# Patient Record
Sex: Female | Born: 1935 | Race: White | Hispanic: No | Smoking: Never smoker
Health system: Southern US, Community
[De-identification: ages and names within clinical notes are randomized; demographics above are authoritative.]

## PROBLEM LIST (undated history)

## (undated) DIAGNOSIS — J449 Chronic obstructive pulmonary disease, unspecified: Secondary | ICD-10-CM

## (undated) DIAGNOSIS — I10 Essential (primary) hypertension: Secondary | ICD-10-CM

## (undated) DIAGNOSIS — F0390 Unspecified dementia without behavioral disturbance: Secondary | ICD-10-CM

## (undated) DIAGNOSIS — T7840XA Allergy, unspecified, initial encounter: Secondary | ICD-10-CM

## (undated) DIAGNOSIS — K219 Gastro-esophageal reflux disease without esophagitis: Secondary | ICD-10-CM

## (undated) DIAGNOSIS — E785 Hyperlipidemia, unspecified: Secondary | ICD-10-CM

## (undated) DIAGNOSIS — G20A1 Parkinson's disease without dyskinesia, without mention of fluctuations: Secondary | ICD-10-CM

## (undated) DIAGNOSIS — G20 Parkinson's disease: Secondary | ICD-10-CM

## (undated) HISTORY — DX: Essential (primary) hypertension: I10

## (undated) HISTORY — PX: ABDOMINAL HYSTERECTOMY: SHX81

## (undated) HISTORY — DX: Allergy, unspecified, initial encounter: T78.40XA

## (undated) HISTORY — PX: FOOT GANGLION EXCISION: SHX1660

## (undated) HISTORY — DX: Gastro-esophageal reflux disease without esophagitis: K21.9

## (undated) HISTORY — PX: APPENDECTOMY: SHX54

## (undated) HISTORY — DX: Hyperlipidemia, unspecified: E78.5

## (undated) HISTORY — PX: SHOULDER SURGERY: SHX246

## (undated) HISTORY — PX: EYE SURGERY: SHX253

## (undated) HISTORY — DX: Chronic obstructive pulmonary disease, unspecified: J44.9

---

## 1986-11-06 HISTORY — PX: LUNG REMOVAL, PARTIAL: SHX233

## 1991-11-07 HISTORY — PX: CYSTOSCOPY: SUR368

## 2004-11-24 ENCOUNTER — Ambulatory Visit: Payer: Self-pay

## 2004-12-07 HISTORY — PX: CT LUNG SCREENING: HXRAD848

## 2005-02-08 ENCOUNTER — Ambulatory Visit: Payer: Self-pay | Admitting: Family Medicine

## 2005-02-20 ENCOUNTER — Ambulatory Visit: Payer: Self-pay | Admitting: Family Medicine

## 2005-05-31 ENCOUNTER — Ambulatory Visit: Payer: Self-pay

## 2005-08-07 ENCOUNTER — Ambulatory Visit: Payer: Self-pay | Admitting: Family Medicine

## 2005-09-04 ENCOUNTER — Ambulatory Visit: Payer: Self-pay | Admitting: Family Medicine

## 2006-03-07 ENCOUNTER — Ambulatory Visit: Payer: Self-pay | Admitting: Family Medicine

## 2006-04-06 ENCOUNTER — Encounter: Payer: Self-pay | Admitting: Family Medicine

## 2006-04-06 LAB — CONVERTED CEMR LAB

## 2006-04-26 ENCOUNTER — Ambulatory Visit: Payer: Self-pay | Admitting: Family Medicine

## 2006-05-04 ENCOUNTER — Ambulatory Visit: Payer: Self-pay | Admitting: Pulmonary Disease

## 2006-05-23 ENCOUNTER — Ambulatory Visit: Payer: Self-pay | Admitting: Family Medicine

## 2006-08-15 ENCOUNTER — Ambulatory Visit: Payer: Self-pay | Admitting: Family Medicine

## 2006-09-21 ENCOUNTER — Ambulatory Visit: Payer: Self-pay | Admitting: Family Medicine

## 2006-10-08 ENCOUNTER — Ambulatory Visit: Payer: Self-pay | Admitting: Family Medicine

## 2006-11-06 HISTORY — PX: OTHER SURGICAL HISTORY: SHX169

## 2006-11-06 HISTORY — PX: FRACTURE SURGERY: SHX138

## 2006-11-21 ENCOUNTER — Ambulatory Visit: Payer: Self-pay | Admitting: Pulmonary Disease

## 2007-04-09 ENCOUNTER — Ambulatory Visit: Payer: Self-pay | Admitting: Family Medicine

## 2007-04-11 LAB — CONVERTED CEMR LAB
ALT: 15 units/L (ref 0–40)
HDL: 60.1 mg/dL (ref >39.0)
Total CHOL/HDL Ratio: 3.9
Triglycerides: 134 mg/dL (ref 0–149)
VLDL: 27 mg/dL (ref 0–40)

## 2007-04-30 ENCOUNTER — Encounter: Payer: Self-pay | Admitting: Family Medicine

## 2007-04-30 DIAGNOSIS — K219 Gastro-esophageal reflux disease without esophagitis: Secondary | ICD-10-CM | POA: Insufficient documentation

## 2007-04-30 DIAGNOSIS — M810 Age-related osteoporosis without current pathological fracture: Secondary | ICD-10-CM | POA: Insufficient documentation

## 2007-04-30 DIAGNOSIS — J449 Chronic obstructive pulmonary disease, unspecified: Secondary | ICD-10-CM

## 2007-04-30 DIAGNOSIS — I10 Essential (primary) hypertension: Secondary | ICD-10-CM

## 2007-04-30 DIAGNOSIS — E785 Hyperlipidemia, unspecified: Secondary | ICD-10-CM

## 2007-04-30 DIAGNOSIS — J4489 Other specified chronic obstructive pulmonary disease: Secondary | ICD-10-CM | POA: Insufficient documentation

## 2007-07-23 ENCOUNTER — Ambulatory Visit: Payer: Self-pay | Admitting: Family Medicine

## 2007-07-26 ENCOUNTER — Telehealth (INDEPENDENT_AMBULATORY_CARE_PROVIDER_SITE_OTHER): Payer: Self-pay | Admitting: *Deleted

## 2007-09-25 ENCOUNTER — Ambulatory Visit: Payer: Self-pay | Admitting: Family Medicine

## 2007-09-25 DIAGNOSIS — J3089 Other allergic rhinitis: Secondary | ICD-10-CM

## 2007-09-25 DIAGNOSIS — J302 Other seasonal allergic rhinitis: Secondary | ICD-10-CM

## 2007-09-30 LAB — CONVERTED CEMR LAB
ALT: 18 units/L (ref 0–35)
AST: 29 units/L (ref 0–37)
LDL Cholesterol: 72 mg/dL (ref 0–99)
Total CHOL/HDL Ratio: 2.2
VLDL: 12 mg/dL (ref 0–40)

## 2007-10-23 ENCOUNTER — Ambulatory Visit: Payer: Self-pay | Admitting: Internal Medicine

## 2007-12-30 ENCOUNTER — Ambulatory Visit: Payer: Self-pay | Admitting: Family Medicine

## 2008-01-07 ENCOUNTER — Ambulatory Visit: Payer: Self-pay | Admitting: Internal Medicine

## 2008-01-13 ENCOUNTER — Telehealth: Payer: Self-pay | Admitting: Family Medicine

## 2008-02-05 ENCOUNTER — Ambulatory Visit: Payer: Self-pay | Admitting: Family Medicine

## 2008-03-06 LAB — CONVERTED CEMR LAB: Pap Smear: NORMAL

## 2008-03-30 ENCOUNTER — Ambulatory Visit: Payer: Self-pay | Admitting: Specialist

## 2008-05-28 ENCOUNTER — Telehealth (INDEPENDENT_AMBULATORY_CARE_PROVIDER_SITE_OTHER): Payer: Self-pay | Admitting: *Deleted

## 2008-05-29 ENCOUNTER — Ambulatory Visit: Payer: Self-pay | Admitting: Family Medicine

## 2008-06-02 LAB — CONVERTED CEMR LAB
AST: 27 units/L (ref 0–37)
CO2: 29 meq/L (ref 19–32)
Chloride: 96 meq/L (ref 96–112)
GFR calc Af Amer: 103 mL/min
GFR calc non Af Amer: 85 mL/min
Glucose, Bld: 95 mg/dL (ref 70–99)
Potassium: 3.9 meq/L (ref 3.5–5.1)
Sodium: 135 meq/L (ref 135–145)

## 2008-06-02 IMAGING — CT CT CHEST W/ CM
1 series · 15 of 33 positions shown, 19 images · non-contrast
Comparison: none

REASON FOR EXAM: increased opacities
COMMENTS:

[Series 2: soft tissue · axial · 0.64mm/px · z∈[-572,-282]mm · 15 of 70 slices shown, 19 images]
[im 6/70  mediastinal]
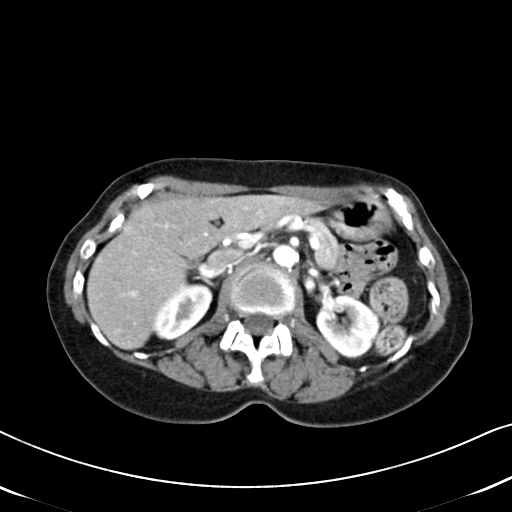
[im 6/70  lung]
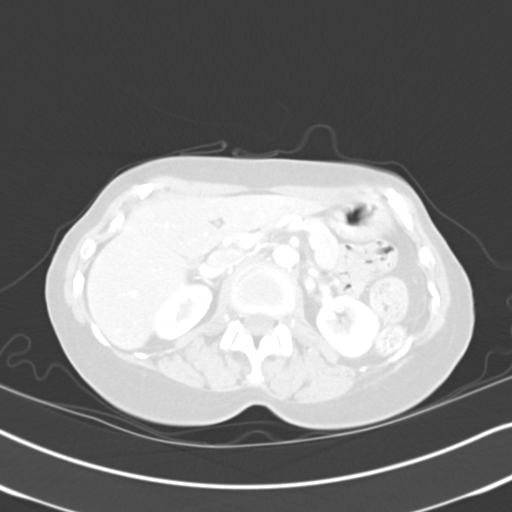
[im 11/70  lung]
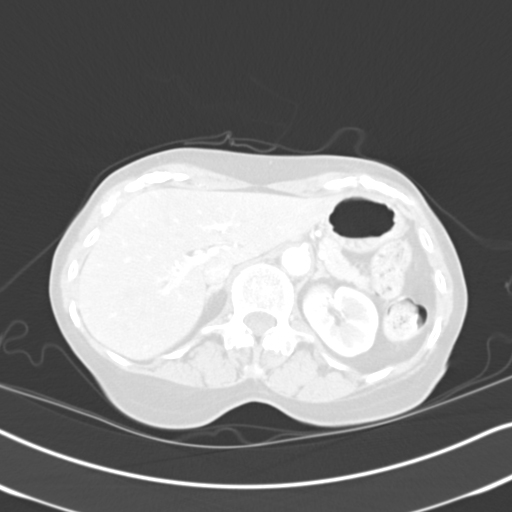
[im 14/70  lung]
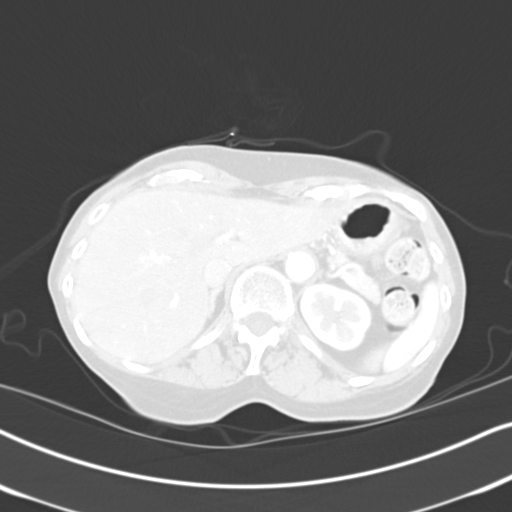
[im 18/70  lung]
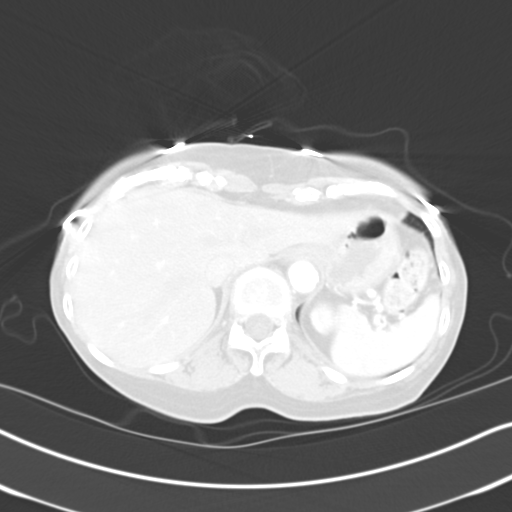
[im 24/70  mediastinal]
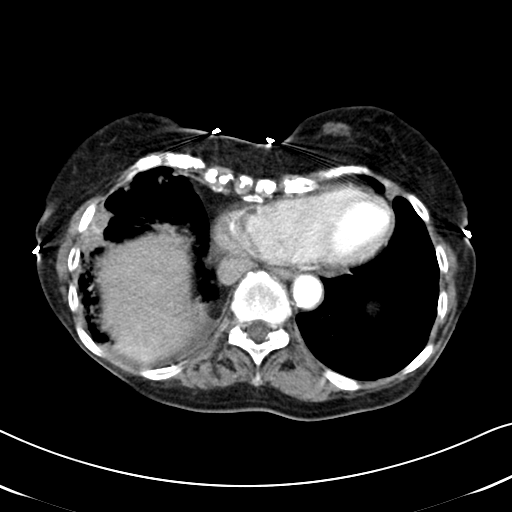
[im 24/70  lung]
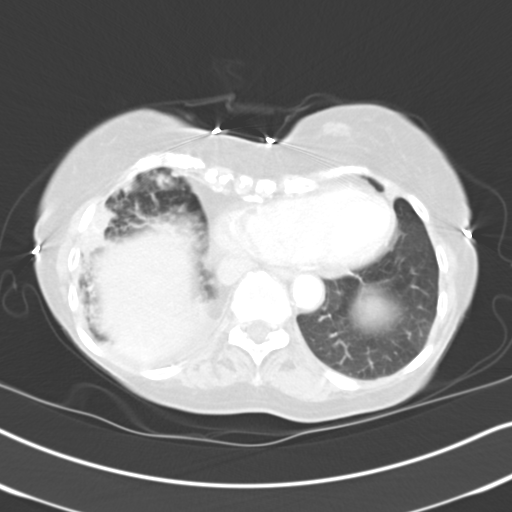
[im 28/70  lung]
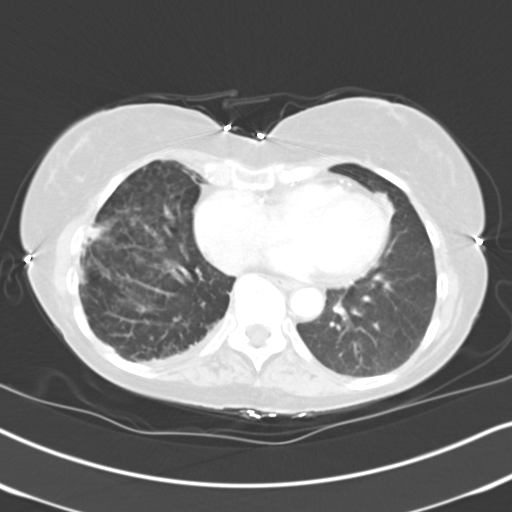
[im 31/70  lung]
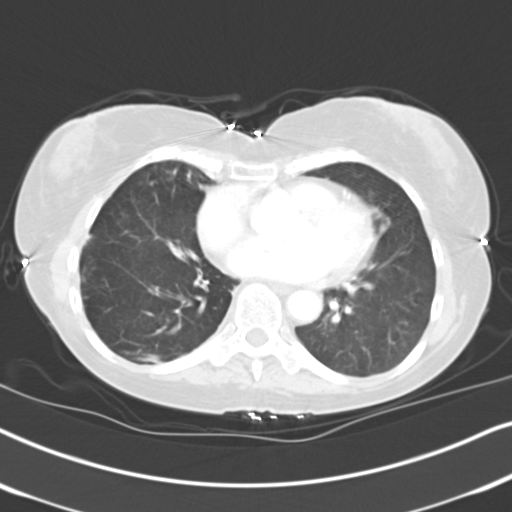
[im 36/70  lung]
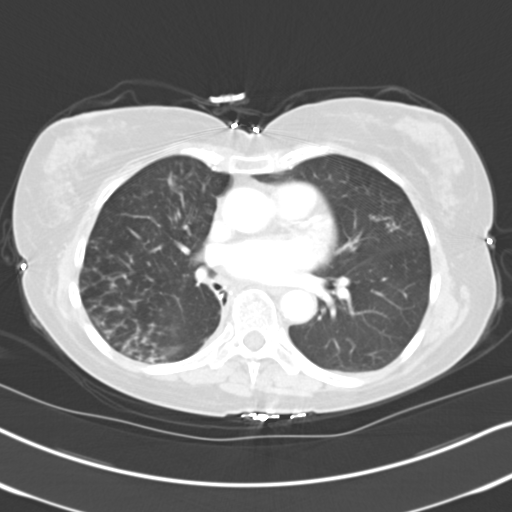
[im 39/70  mediastinal]
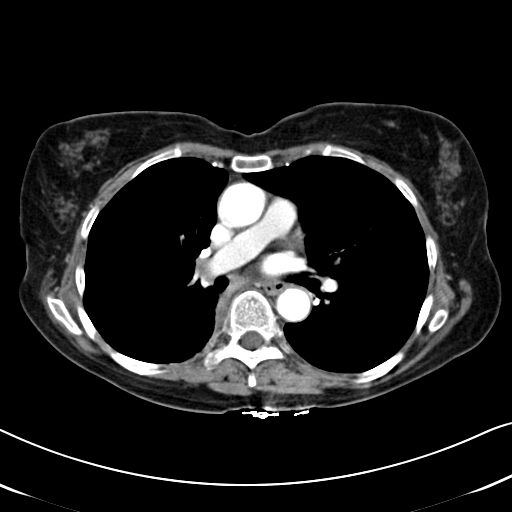
[im 39/70  lung]
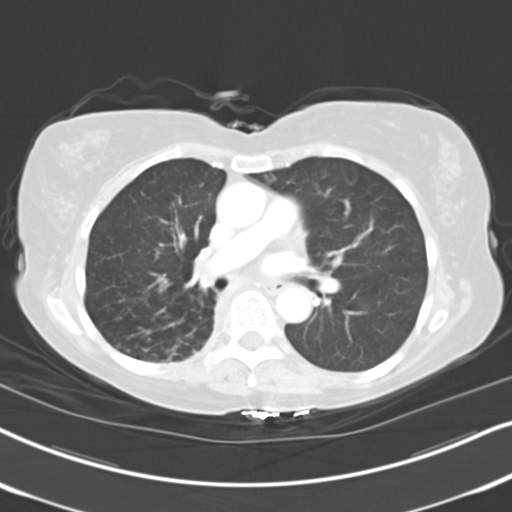
[im 42/70  lung]
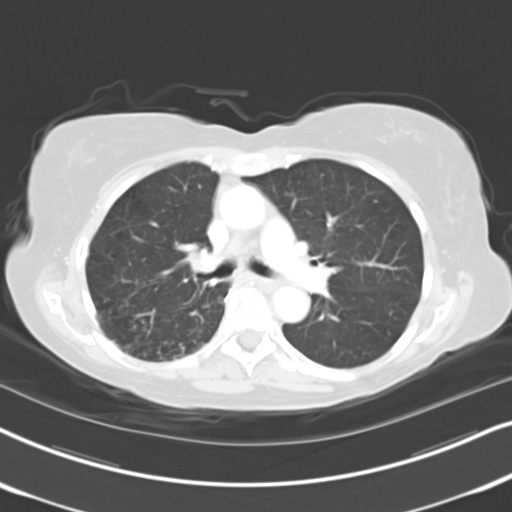
[im 47/70  lung]
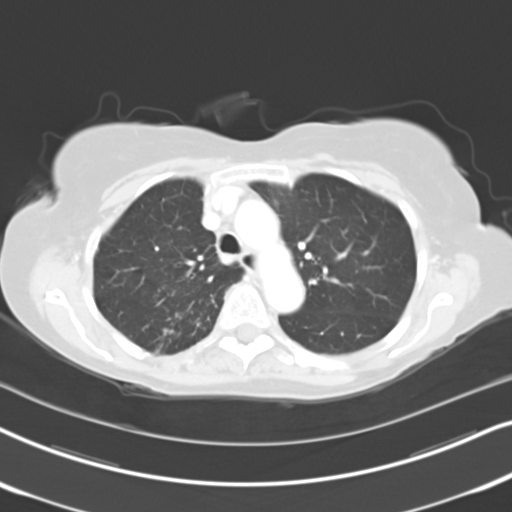
[im 52/70  lung]
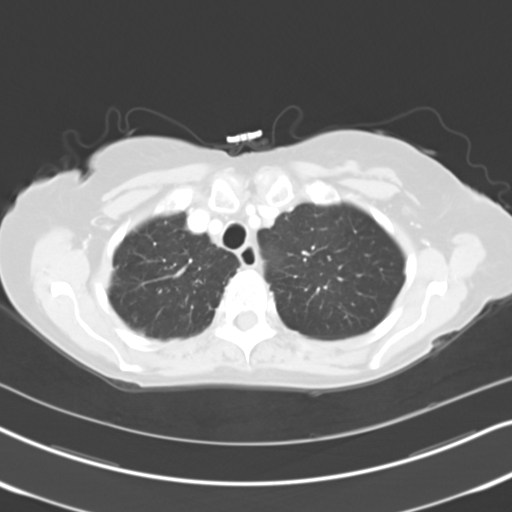
[im 56/70  mediastinal]
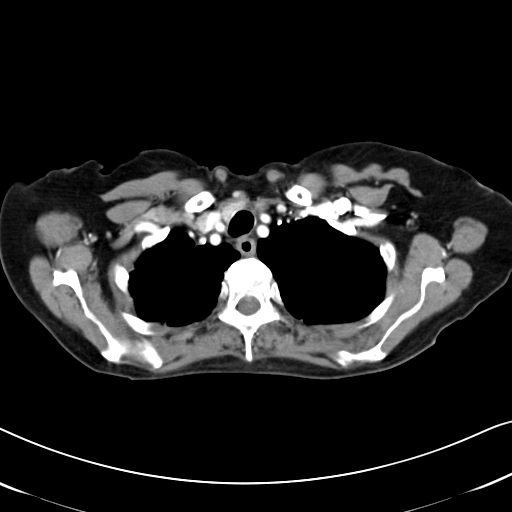
[im 56/70  lung]
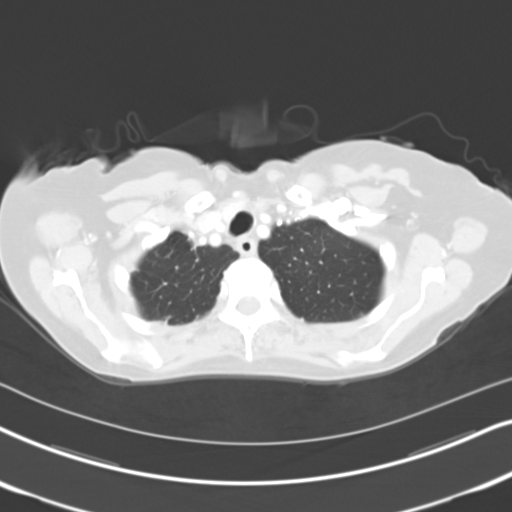
[im 59/70  lung]
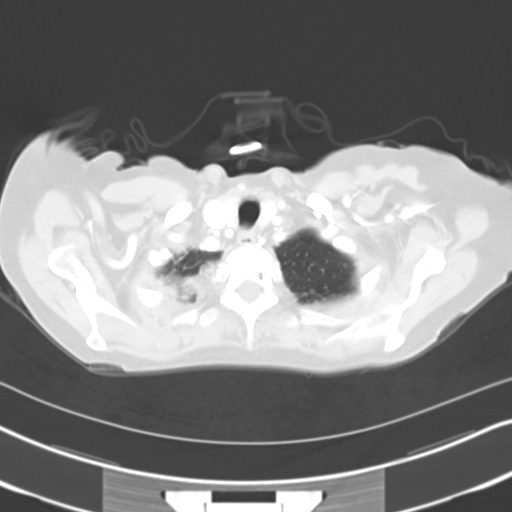
[im 64/70  lung]
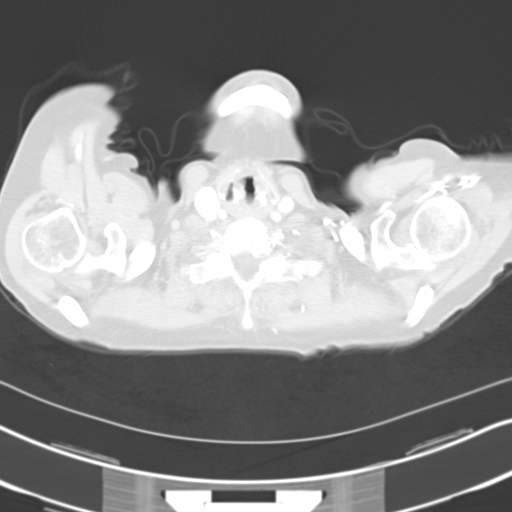

[15 of 33 positions shown; findings below may reference images not displayed]

PROCEDURE:     CT  - CT CHEST WITH CONTRAST  - March 30, 2008  [DATE]

RESULT:     CT of the chest is performed utilizing 70 ml of 2sovue-WRD
iodinated intravenous contrast. Comparison is made to images dated 11/24/2004
and 05/31/2005. The pleural based density in the RIGHT lung base in the
region of the costophrenic angle appears to have increased since the
previous exam. There is a trace RIGHT pleural effusion. The pleural based
mass measures approximately 3 cm anterior to posterior x 1.5 cm medial to
lateral. The adrenal glands are normal. Lung window images demonstrate
diffusely increased prominence of the lung markings in the RIGHT lower lobe
and RIGHT middle lobe and to a lesser extent the basal portion of the RIGHT
upper lobe. No focal consolidation is present. There is respiratory motion
artifact. The LEFT lung remains relatively clear. Some fibrotic changes are
present at the apices. The upper abdominal viscera appear unremarkable.
There is no mediastinal or hilar mass or adenopathy evident.
IMPRESSION: 1.Enlarging lesion, pleural based, in the RIGHT lower lobe. There are patchy
areas of infiltrate or fibrosis in the RIGHT lung involving the RIGHT lower
lobe and RIGHT middle lobe predominantly with some minimal involvement in
the RIGHT upper lobe. Follow-up with PET CT may be beneficial. Progressive
fibrosis cannot be excluded.

## 2008-07-21 ENCOUNTER — Ambulatory Visit: Payer: Self-pay | Admitting: Family Medicine

## 2008-07-31 ENCOUNTER — Ambulatory Visit: Payer: Self-pay | Admitting: Family Medicine

## 2008-07-31 LAB — CONVERTED CEMR LAB
Bilirubin Urine: NEGATIVE
Casts: 0 /lpf
Glucose, Urine, Semiquant: NEGATIVE
Urobilinogen, UA: 0.2
Yeast, UA: 0

## 2008-08-01 ENCOUNTER — Encounter: Payer: Self-pay | Admitting: Family Medicine

## 2008-08-06 ENCOUNTER — Telehealth (INDEPENDENT_AMBULATORY_CARE_PROVIDER_SITE_OTHER): Payer: Self-pay | Admitting: *Deleted

## 2008-08-17 ENCOUNTER — Ambulatory Visit: Payer: Self-pay | Admitting: Family Medicine

## 2008-09-08 ENCOUNTER — Ambulatory Visit: Payer: Self-pay | Admitting: Family Medicine

## 2008-09-09 LAB — CONVERTED CEMR LAB
ALT: 18 units/L (ref 0–35)
Albumin: 3.8 g/dL (ref 3.5–5.2)
BUN: 13 mg/dL (ref 6–23)
Basophils Relative: 0.8 % (ref 0.0–3.0)
Bilirubin, Direct: 0.1 mg/dL (ref 0.0–0.3)
Chloride: 97 meq/L (ref 96–112)
Eosinophils Absolute: 0.2 10*3/uL (ref 0.0–0.7)
Eosinophils Relative: 4.8 % (ref 0.0–5.0)
GFR calc Af Amer: 103 mL/min
GFR calc non Af Amer: 85 mL/min
HCT: 38.4 % (ref 36.0–46.0)
HDL: 75.5 mg/dL (ref >39.0)
Hemoglobin: 13.3 g/dL (ref 12.0–15.0)
MCV: 93.6 fL (ref 78.0–100.0)
Monocytes Absolute: 0.5 10*3/uL (ref 0.1–1.0)
Monocytes Relative: 11 % (ref 3.0–12.0)
Neutro Abs: 2.7 10*3/uL (ref 1.4–7.7)
Phosphorus: 3.8 mg/dL (ref 2.3–4.6)
Platelets: 322 10*3/uL (ref 150–400)
Potassium: 3.9 meq/L (ref 3.5–5.1)
RBC: 4.11 M/uL (ref 3.87–5.11)
Sodium: 134 meq/L — ABNORMAL LOW (ref 135–145)
Total CHOL/HDL Ratio: 2.3
Total Protein: 7.1 g/dL (ref 6.0–8.3)
WBC: 4.6 10*3/uL (ref 4.5–10.5)

## 2008-09-10 LAB — CONVERTED CEMR LAB: Vit D, 1,25-Dihydroxy: 43 (ref 30–89)

## 2008-09-14 ENCOUNTER — Ambulatory Visit: Payer: Self-pay | Admitting: Specialist

## 2008-11-11 ENCOUNTER — Telehealth: Payer: Self-pay | Admitting: Family Medicine

## 2008-11-12 ENCOUNTER — Ambulatory Visit: Payer: Self-pay | Admitting: Family Medicine

## 2008-11-12 DIAGNOSIS — E162 Hypoglycemia, unspecified: Secondary | ICD-10-CM | POA: Insufficient documentation

## 2008-11-17 IMAGING — CT CT CHEST W/ CM
1 series · 15 of 33 positions shown, 19 images · IV contrast (isovue)
Comparison: none

REASON FOR EXAM: Right mid base lesion
COMMENTS:

PROCEDURE:     CT  - CT CHEST WITH CONTRAST  - September 14, 2008 [DATE]
RESULT:     CT of the chest is performed utilizing approximately 75 ml of
Isovue-18P iodinated intravenous contrast.
Comparison is made to previous examinations performed on 03/30/2008 as well
as 05/31/2005.

[Series 2: soft tissue · axial · 0.59mm/px · z∈[+36,+311]mm · 15 of 65 slices shown, 19 images]
[im 5/65  mediastinal]
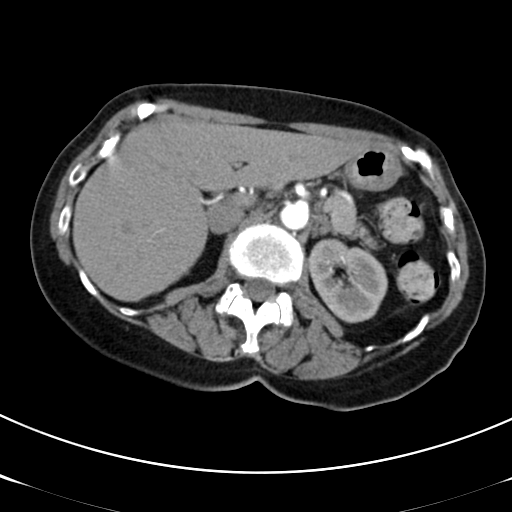
[im 5/65  lung]
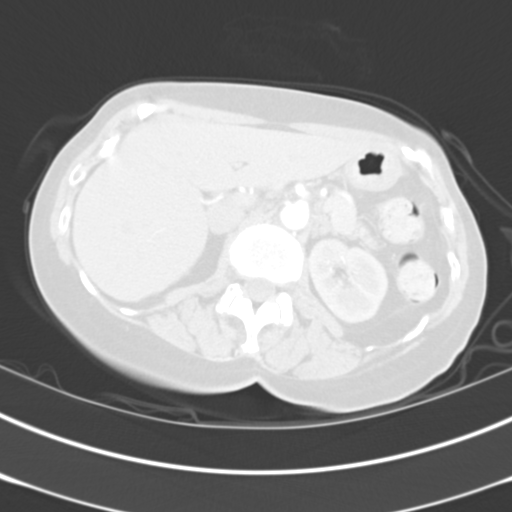
[im 10/65  lung]
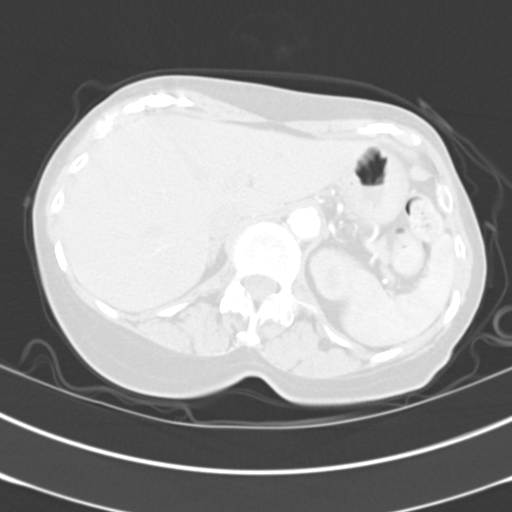
[im 13/65  lung]
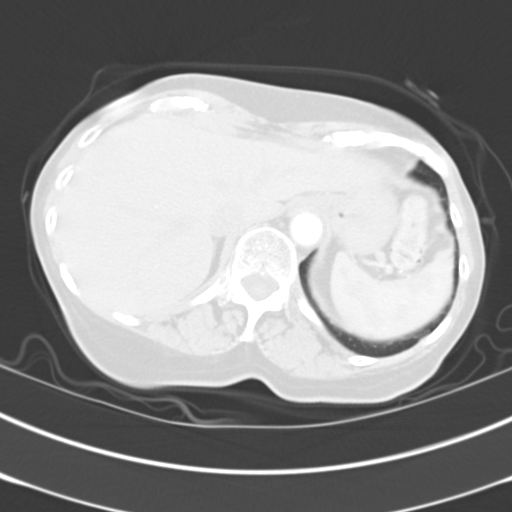
[im 17/65  lung]
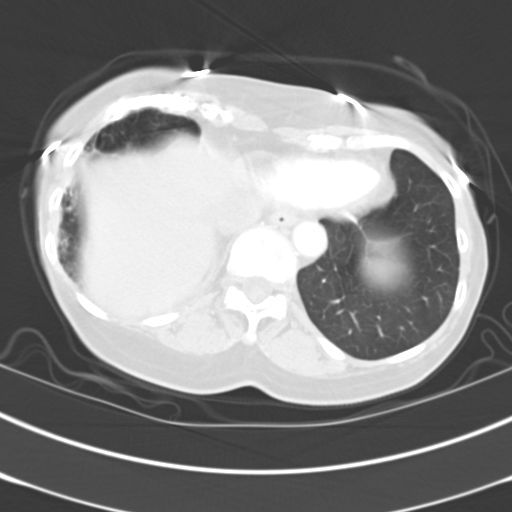
[im 22/65  mediastinal]
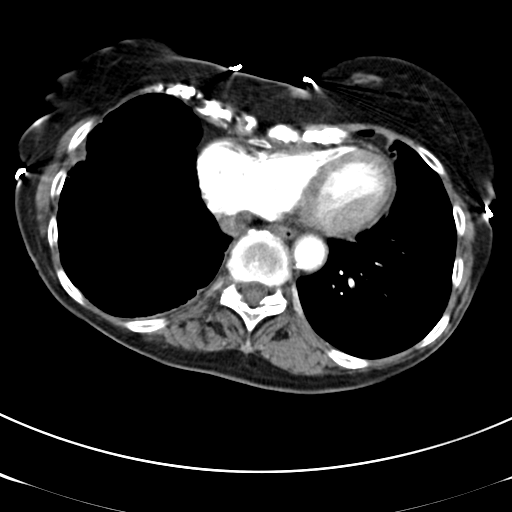
[im 22/65  lung]
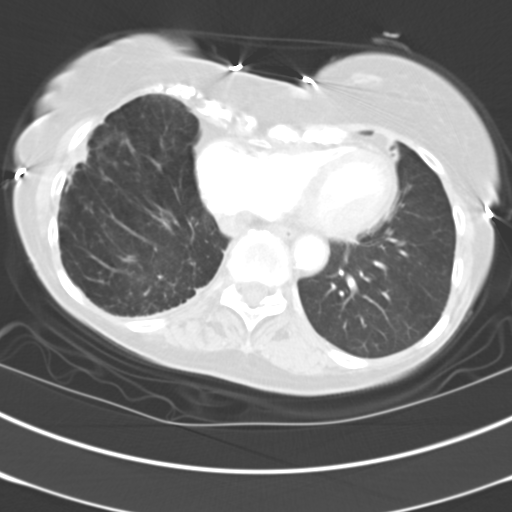
[im 26/65  lung]
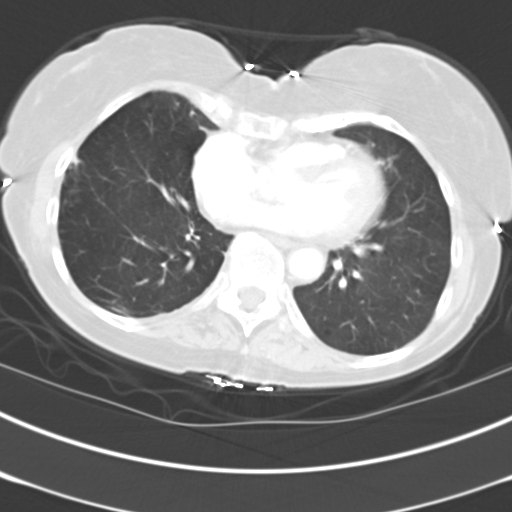
[im 29/65  lung]
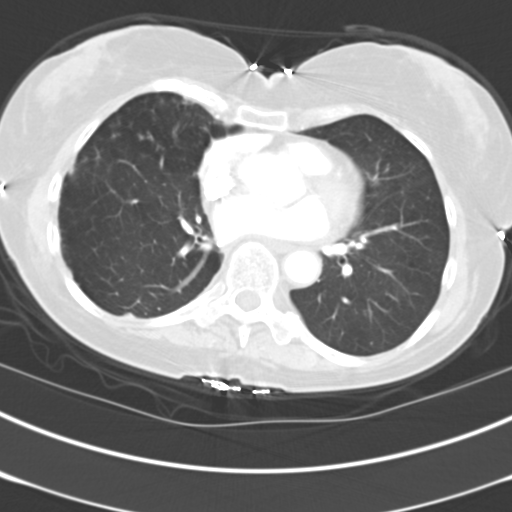
[im 34/65  lung]
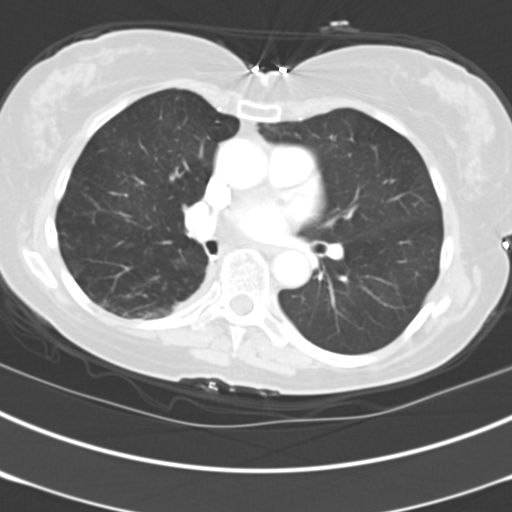
[im 36/65  mediastinal]
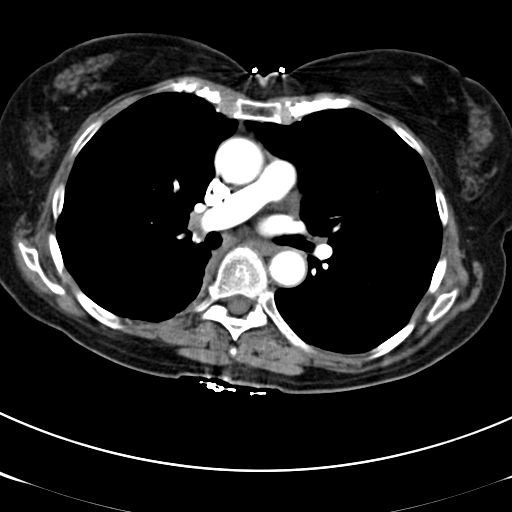
[im 36/65  lung]
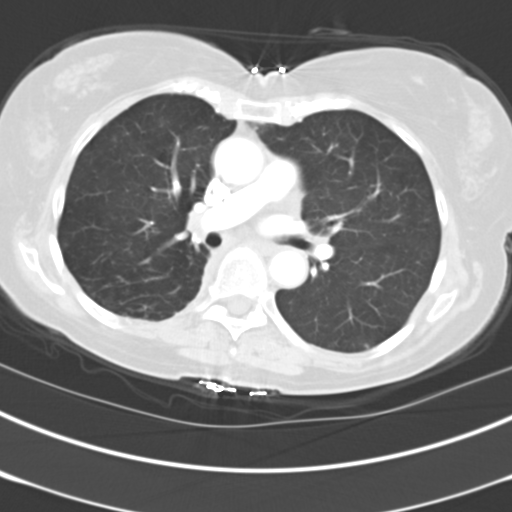
[im 39/65  lung]
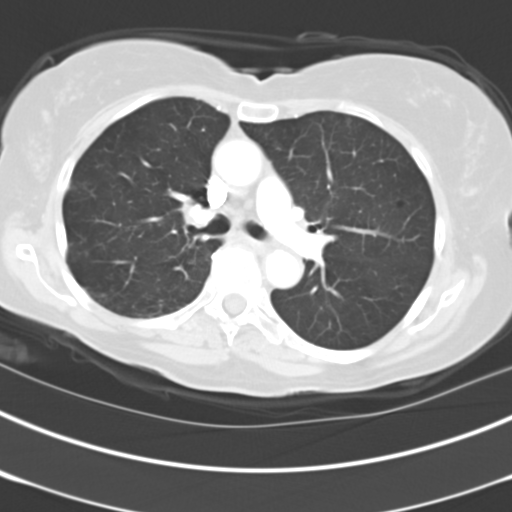
[im 43/65  lung]
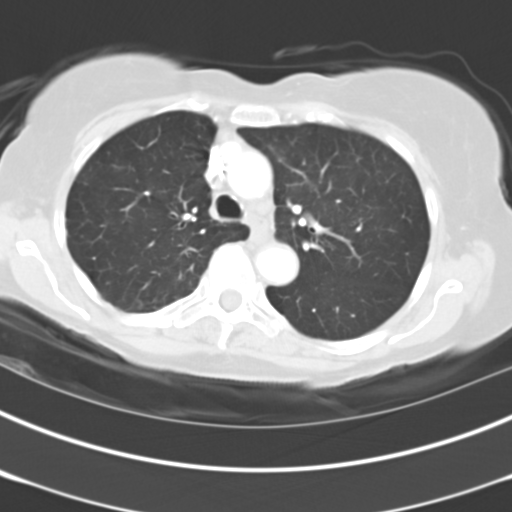
[im 48/65  lung]
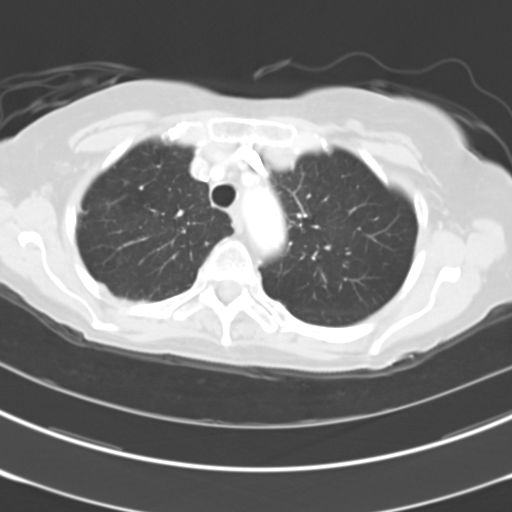
[im 52/65  mediastinal]
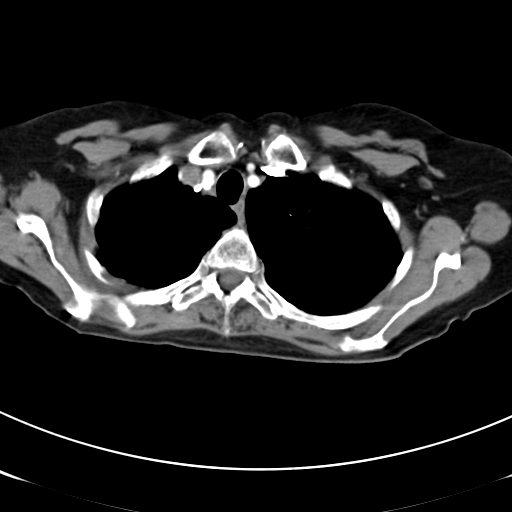
[im 52/65  lung]
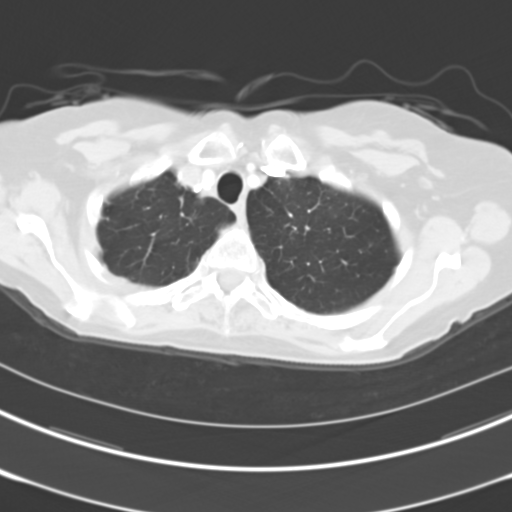
[im 55/65  lung]
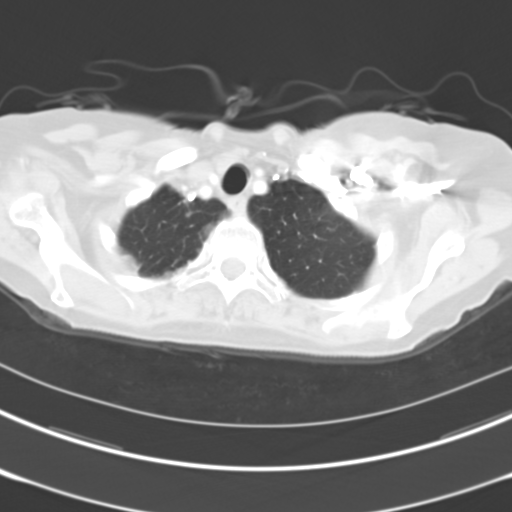
[im 60/65  lung]
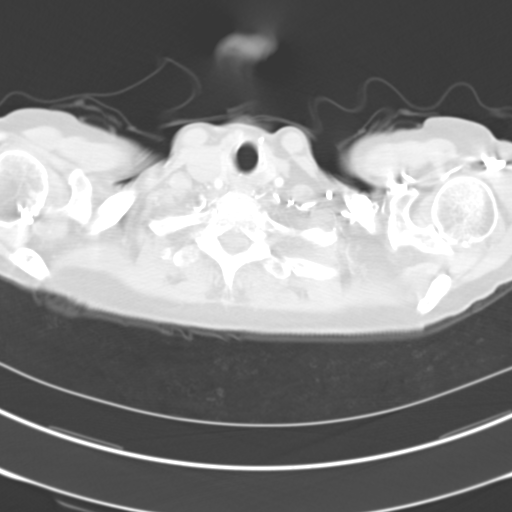

[15 of 33 positions shown; findings below may reference images not displayed]

FINDINGS: There is pleural-based density near the RIGHT lung base laterally
showing an anterior to posterior dimension of 1.78 cm and a medial to
lateral dimension of 1.12 cm. This appears to be essentially stable. Lung
window images show what appears to be some fibrosis especially at the RIGHT
lung base. Some ill-defined, patchy infiltrates appear to be present as
well. Underlying moderately severe emphysematous changes are noted. There is
no evidence of mediastinal or hilar mass or adenopathy. No axillary mass is
present. There is no pleural or pericardial effusion. There is a mild pectus
excavatum deformity. The upper abdominal images demonstrate early arterial
phase enhancement.
IMPRESSION: Improved aeration at the RIGHT lung base. There may be some
patchy areas of fibrosis or minimal infiltrate present. There is subpleural
nodularity which is pleural-based near the RIGHT lung base anterolaterally.
This is stable. There is minimal basilar infiltrate or fibrosis on the
RIGHT. Diffuse emphysematous changes are present.

## 2008-11-25 ENCOUNTER — Telehealth (INDEPENDENT_AMBULATORY_CARE_PROVIDER_SITE_OTHER): Payer: Self-pay | Admitting: *Deleted

## 2009-01-18 ENCOUNTER — Telehealth (INDEPENDENT_AMBULATORY_CARE_PROVIDER_SITE_OTHER): Payer: Self-pay | Admitting: *Deleted

## 2009-01-19 ENCOUNTER — Ambulatory Visit: Payer: Self-pay | Admitting: Family Medicine

## 2009-02-02 ENCOUNTER — Ambulatory Visit: Payer: Self-pay | Admitting: Family Medicine

## 2009-02-02 LAB — CONVERTED CEMR LAB
Bilirubin Urine: NEGATIVE
Glucose, Urine, Semiquant: NEGATIVE
Ketones, urine, test strip: NEGATIVE
Specific Gravity, Urine: <1.005
Urobilinogen, UA: 0.2
pH: 8.5

## 2009-02-03 ENCOUNTER — Encounter: Payer: Self-pay | Admitting: Family Medicine

## 2009-02-16 ENCOUNTER — Encounter: Payer: Self-pay | Admitting: Family Medicine

## 2009-05-06 LAB — CONVERTED CEMR LAB: Pap Smear: NORMAL

## 2009-07-27 ENCOUNTER — Telehealth: Payer: Self-pay | Admitting: Family Medicine

## 2009-08-12 ENCOUNTER — Ambulatory Visit: Payer: Self-pay | Admitting: Family Medicine

## 2009-08-31 ENCOUNTER — Telehealth: Payer: Self-pay | Admitting: Family Medicine

## 2009-09-07 ENCOUNTER — Ambulatory Visit: Payer: Self-pay | Admitting: Family Medicine

## 2009-09-09 LAB — CONVERTED CEMR LAB
ALT: 19 units/L (ref 0–35)
BUN: 16 mg/dL (ref 6–23)
Basophils Relative: 0.3 % (ref 0.0–3.0)
CO2: 30 meq/L (ref 19–32)
Calcium: 9.2 mg/dL (ref 8.4–10.5)
Chloride: 99 meq/L (ref 96–112)
Cholesterol: 169 mg/dL (ref 0–200)
Eosinophils Relative: 3.2 % (ref 0.0–5.0)
GFR calc non Af Amer: 72.92 mL/min (ref >60)
HDL: 61.1 mg/dL (ref >39.00)
LDL Cholesterol: 93 mg/dL (ref 0–99)
Lymphocytes Relative: 29.8 % (ref 12.0–46.0)
Monocytes Absolute: 0.6 10*3/uL (ref 0.1–1.0)
Monocytes Relative: 11.5 % (ref 3.0–12.0)
Neutrophils Relative %: 55.2 % (ref 43.0–77.0)
Platelets: 316 10*3/uL (ref 150.0–400.0)
RBC: 3.82 M/uL — ABNORMAL LOW (ref 3.87–5.11)
TSH: 2.29 microintl units/mL (ref 0.35–5.50)
Triglycerides: 76 mg/dL (ref 0.0–149.0)
VLDL: 15.2 mg/dL (ref 0.0–40.0)
WBC: 5.2 10*3/uL (ref 4.5–10.5)

## 2009-09-14 ENCOUNTER — Ambulatory Visit: Payer: Self-pay | Admitting: Family Medicine

## 2010-02-14 ENCOUNTER — Encounter: Payer: Self-pay | Admitting: Family Medicine

## 2010-03-07 ENCOUNTER — Encounter: Payer: Self-pay | Admitting: Family Medicine

## 2010-03-08 ENCOUNTER — Ambulatory Visit: Payer: Self-pay | Admitting: Family Medicine

## 2010-03-09 LAB — CONVERTED CEMR LAB
Albumin: 3.7 g/dL (ref 3.5–5.2)
BUN: 15 mg/dL (ref 6–23)
Calcium: 9.1 mg/dL (ref 8.4–10.5)
Chloride: 100 meq/L (ref 96–112)
HDL: 58.8 mg/dL (ref >39.00)
Potassium: 4 meq/L (ref 3.5–5.1)
Total CHOL/HDL Ratio: 2
Triglycerides: 52 mg/dL (ref 0.0–149.0)
VLDL: 10.4 mg/dL (ref 0.0–40.0)

## 2010-03-15 ENCOUNTER — Ambulatory Visit: Payer: Self-pay | Admitting: Family Medicine

## 2010-06-15 ENCOUNTER — Ambulatory Visit: Payer: Self-pay | Admitting: Family Medicine

## 2010-06-24 ENCOUNTER — Ambulatory Visit: Payer: Self-pay | Admitting: Internal Medicine

## 2010-06-27 ENCOUNTER — Telehealth: Payer: Self-pay | Admitting: Family Medicine

## 2010-07-05 ENCOUNTER — Ambulatory Visit: Payer: Self-pay | Admitting: Family Medicine

## 2010-09-14 ENCOUNTER — Ambulatory Visit: Payer: Self-pay | Admitting: Family Medicine

## 2010-09-15 LAB — CONVERTED CEMR LAB
Albumin: 4 g/dL (ref 3.5–5.2)
BUN: 19 mg/dL (ref 6–23)
CO2: 31 meq/L (ref 19–32)
Calcium: 9.8 mg/dL (ref 8.4–10.5)
Cholesterol: 165 mg/dL (ref 0–200)
Creatinine, Ser: 0.7 mg/dL (ref 0.4–1.2)
LDL Cholesterol: 75 mg/dL (ref 0–99)
Triglycerides: 70 mg/dL (ref 0.0–149.0)
VLDL: 14 mg/dL (ref 0.0–40.0)

## 2010-09-19 ENCOUNTER — Ambulatory Visit: Payer: Self-pay | Admitting: Family Medicine

## 2010-10-16 ENCOUNTER — Emergency Department: Payer: Self-pay | Admitting: Unknown Physician Specialty

## 2010-10-21 ENCOUNTER — Ambulatory Visit: Payer: Self-pay | Admitting: Family Medicine

## 2010-10-21 DIAGNOSIS — S0180XA Unspecified open wound of other part of head, initial encounter: Secondary | ICD-10-CM | POA: Insufficient documentation

## 2010-10-24 ENCOUNTER — Emergency Department: Payer: Self-pay | Admitting: Unknown Physician Specialty

## 2010-11-10 ENCOUNTER — Ambulatory Visit
Admission: RE | Admit: 2010-11-10 | Discharge: 2010-11-10 | Payer: Self-pay | Source: Home / Self Care | Attending: Family Medicine | Admitting: Family Medicine

## 2010-11-16 ENCOUNTER — Encounter: Payer: Self-pay | Admitting: Family Medicine

## 2010-12-04 LAB — CONVERTED CEMR LAB
BUN: 15 mg/dL (ref 6–23)
Calcium: 9 mg/dL (ref 8.4–10.5)
GFR calc Af Amer: 152 mL/min
GFR calc non Af Amer: 126 mL/min
Potassium: 4.1 meq/L (ref 3.5–5.1)
Sodium: 136 meq/L (ref 135–145)

## 2010-12-06 NOTE — Progress Notes (Signed)
Summary: ? about cough med  Phone Note Call from Patient Call back at [phone]   Caller: Patient Call For: Dr. Sharen Hones Summary of Call: Patient was given a cough medication and it is causing her to cough more. Patient states that every time she takes the medication she coughs more. Patient has coughed so much that she is sore. Please advise.  [facility] Initial call taken by: Sydell Axon LPN,  June 27, 2010 11:46 AM  Follow-up for Phone Call        what medicine?  guaifenesin likely causing coughing.  If not feeling better with it, may stop.  Are tessalon perls helping?  Did she fill abx?  allergy to codeine.  Try honey with lemon.  If not better, may need to be seen again. Follow-up by: Eustaquio Boyden  MD,  June 27, 2010 12:17 PM  Additional Follow-up for Phone Call Additional follow up Details #1::        Spoke with patient. She just wanted confirmation that she was supposed to cough with the guaifenesin. The tesselon perls do help. I advised her that she may have the cough for 7-10 days or longer. I encouraged plenty of fluids to help break up the chest congestion. She understood and will call back if she has further problems Additional Follow-up by: Janee Morn CMA Duncan Dull),  June 27, 2010 2:02 PM    Additional Follow-up for Phone Call Additional follow up Details #2::    thank you.  if not bringing anything up with cough, can stop guaifenesin. Follow-up by: Eustaquio Boyden  MD,  June 27, 2010 2:03 PM  Additional Follow-up for Phone Call Additional follow up Details #3:: Details for Additional Follow-up Action Taken: She said was productive and she will continue meds as directed. She hasn't filled the abx as of now because she doesn't feel worse other than the pain from coughing. Additional Follow-up by: Janee Morn CMA Duncan Dull),  June 27, 2010 2:07 PM

## 2010-12-06 NOTE — Assessment & Plan Note (Signed)
Summary: 6 MONTH FOLLOW UP/[facility]   Vital Signs:  Patient profile:   77 year old female Height:      58 inches Weight:      93.08 pounds BMI:     19.52 Temp:     97.8 degrees F oral Pulse rate:   80 / minute Pulse rhythm:   regular BP sitting:   124 / 70  (left arm) Cuff size:   regular  Vitals Entered By: Lewanda Rife LPN (September 19, 2010 8:12 AM) CC: six month f/u   History of Present Illness: here for f/u of HTN and chol   wt is stable / slim   on vytorin for lipids -- with good report trig of 70 and HDL 75 and LDL 75 hdl went up - is in very good control   is still working -- and proud of this   bp well controlled on altace and clonidine -- with 124/70 today   has been feeling good   spot on nose- redness, will not go away she covers it with makeup no pain ? if could be an early skin cancer she does try to protect her skin  is still working and she enjoys it!    Allergies: 1)  ! Penicillin 2)  ! Sulfa 3)  Codeine  Past History:  Family History: Last updated: 04/30/2007 Father: MI Mother: CVA, HTN, breast cancer Siblings: brother- HTN, cholesterol  Social History: Last updated: 09/08/2008 Marital Status: widowed Children: 1 daughter Occupation: [employer] non smoker  no alcohol   Risk Factors: Smoking Status: never (01/19/2009)  Past Medical History: COPD- congenital lung deformity GERD / Hiatal hernia Hyperlipidemia Hypertension Osteoporosis all rhinitis scar tissue on lung (cxr) after surgery   cardiology- Dr Mikael Spray [facility]) GI-- Elliot/Seigel GYN-- Arvil Chaco pulm-- Flemming  ortho- Evaristo [facility]- Dr Reuel Boom  Past Surgical History: Appendectomy Cataract extraction Hysterectomy- partial, bleeding Lung-wedge resection-s/p sequestration 1988 (has scar tissue on her cxr now) Congenital lung deformity Shoulder surgery- bilateral (2000-2001) Foot surgery- ganglion Colonoscopy (2002) Cystoscopy- bladder diverticulum  (1993) Lung CT- rigth basilor ? mass (12/2004) Lung CT- stable (05/2005) fx L shoulder 08 dexa 2009 - normal / improved   Review of Systems General:  Denies fatigue, fever, loss of appetite, and malaise. Eyes:  Denies blurring and eye irritation. CV:  Denies chest pain or discomfort, lightheadness, and palpitations. Resp:  Denies cough and shortness of breath. GI:  Denies abdominal pain, bloody stools, change in bowel habits, indigestion, and nausea. GU:  Denies urinary frequency. MS:  Complains of stiffness; denies muscle aches, cramps, and muscle weakness. Derm:  Denies itching, lesion(s), poor wound healing, and rash. Neuro:  Denies numbness and tingling. Psych:  mood is generally good -- working helps that . Endo:  Denies cold intolerance, excessive thirst, excessive urination, and heat intolerance. Heme:  Denies abnormal bruising and bleeding.  Physical Exam  General:  slim and well appearing elderly female Head:  normocephalic, atraumatic, and no abnormalities observed.   Eyes:  vision grossly intact, pupils equal, pupils round, and pupils reactive to light.  no conjunctival pallor, injection or icterus  Mouth:  pharynx pink and moist.   Neck:  supple with full rom and no masses or thyromegally, no JVD or carotid bruit, no LAD Chest Wall:  No deformities, masses, or tenderness noted. Lungs:  Normal respiratory effort, chest expands symmetrically. Lungs are clear to auscultation, no crackles or wheezes. Heart:  Normal rate and regular rhythm. S1 and S2 normal without  gallop, murmur, click, rub or other extra sounds. Abdomen:  soft and non-tender.  no renal bruits no hepatomegaly and no splenomegaly.   Msk:  No deformity or scoliosis noted of thoracic or lumbar spine.  no acute joint changes  Pulses:  2+ rad pulses Extremities:  No clubbing, cyanosis, edema, or deformity noted with normal full range of motion of all joints.   Neurologic:  sensation intact to light touch, gait  normal, and DTRs symmetrical and normal.  no tremor  Skin:  .5 cm erythematous area on L side of nose which is smooth  fair complexion without pallor  Cervical Nodes:  No lymphadenopathy noted Psych:  normal affect, talkative and pleasant    Impression & Recommendations:  Problem # 1:  NEOPLASM, SKIN, UNCERTAIN BEHAVIOR (ICD-238.2) Assessment New red spot on L side of nose -- is smooth ref to derm for eval Orders: Dermatology Referral (Derma)  Problem # 2:  HYPERTENSION (ICD-401.9) Assessment: Unchanged  good control on current meds lab reviewed  meds refilled enc active lifestyle Her updated medication list for this problem includes:    Altace 10 Mg Caps (Ramipril) .Marland Kitchen... Take one by mouth daily    Clonidine Hcl 0.1 Mg Tabs (Clonidine hcl) .Marland Kitchen... Take one each am  and one at lunch    Hydrochlorothiazide 25 Mg Tabs (Hydrochlorothiazide) .Marland Kitchen... Take one half by mouth daily  BP today: 124/70 Prior BP: 124/76 (07/05/2010)  Labs Reviewed: K+: 4.1 (09/14/2010) Creat: : 0.7 (09/14/2010)   Chol: 165 (09/14/2010)   HDL: 75.80 (09/14/2010)   LDL: 75 (09/14/2010)   TG: 70.0 (09/14/2010)  Orders: Prescription Created Electronically [phone])  Problem # 3:  HYPERLIPIDEMIA (ICD-272.4) Assessment: Unchanged  this is very well controlled -- better hdl too rev low sat fat diet - doing well ref vytorin  f/ 6 mo  Her updated medication list for this problem includes:    Vytorin 10-20 Mg Tabs (Ezetimibe-simvastatin) .Marland Kitchen... Take one by mouth daily  Labs Reviewed: SGOT: 28 (09/14/2010)   SGPT: 16 (09/14/2010)   HDL:75.80 (09/14/2010), 58.80 (03/08/2010)  LDL:75 (09/14/2010), 75 (03/08/2010)  Chol:165 (09/14/2010), 144 (03/08/2010)  Trig:70.0 (09/14/2010), 52.0 (03/08/2010)  Orders: Prescription Created Electronically [phone])  Complete Medication List: 1)  Vytorin 10-20 Mg Tabs (Ezetimibe-simvastatin) .... Take one by mouth daily 2)  Altace 10 Mg Caps (Ramipril) .... Take one by mouth  daily 3)  Clonidine Hcl 0.1 Mg Tabs (Clonidine hcl) .... Take one each am  and one at lunch 4)  Hydrochlorothiazide 25 Mg Tabs (Hydrochlorothiazide) .... Take one half by mouth daily 5)  Fish Oil 1000 Mg Caps (Omega-3 fatty acids) .... Take one by mouth hs as needed 6)  Vitamin B-12 1000 Mcg Tabs (Cyanocobalamin) .... Take one by mouth at bedtime 7)  Vitamin C 500 Mg Tabs (Ascorbic acid) .... Take one by mouth at bedtime 8)  Calcium 600 1000 Mg Tabs (calcium Carbonate)  .... Daily 9)  Potassium 99 Mg Tabs (Potassium) .... Daily 10)  Womens Multivitamin Plus Tabs (Multiple vitamins-minerals) .... Daily 11)  Zantac 150 Mg Tabs (Ranitidine hcl) .... Take one by mouth daily 12)  Vitamin D 2000 Unit Tabs (Cholecalciferol) .... Take 1 tablet by mouth once a day  Patient Instructions: 1)  labs look good  2)  no change in medicines  3)  I'm glad you got your flu shot  4)  follow up 6 months for hypertension 5)  we will do dermatology referral at check out  Prescriptions: HYDROCHLOROTHIAZIDE 25 MG  TABS (  HYDROCHLOROTHIAZIDE) take one half by mouth daily  #45 x 11   Entered and Authorized by:   Judith Part MD   Signed by:   Judith Part MD on 09/19/2010   Method used:   [facility]        [facility] (retail)       [address]       [place], [place]  [address]       Ph: [phone] or [phone]       Fax: [phone]   RxID:   [id number] CLONIDINE HCL 0.1 MG  TABS (CLONIDINE HCL) take one each am  and one at lunch  #60 x 11   Entered and Authorized by:   Judith Part MD   Signed by:   Judith Part MD on 09/19/2010   Method used:   [facility]        [facility]  [place] (retail)       [address]       [place], [place]  [address]       Ph: [phone] or [phone]       Fax: [phone]   RxID:   [id number] ALTACE 10 MG  CAPS (RAMIPRIL) take one by mouth daily  #30 x 11   Entered and Authorized by:   Judith Part MD   Signed by:   Judith Part MD on 09/19/2010   Method used:   [facility]        [facility]  [place] (retail)       [address]       [place], [place]  [address]       Ph: [phone] or [phone]       Fax: [phone]   RxID:   [id number] VYTORIN 10-20 MG  TABS (EZETIMIBE-SIMVASTATIN) take one by mouth daily  #30 x 11   Entered and Authorized by:   Judith Part MD   Signed by:   Judith Part MD on 09/19/2010   Method used:   [facility]        [facility]  [place] (retail)       [address]       [place], [place]  [address]       Ph: [phone] or [phone]       Fax: [phone]   RxID:   [id number]    Orders Added: 1)  Dermatology Referral [Derma] 2)  Prescription Created Electronically [G8553] 3)  Est. Patient Level IV [26712]    Current Allergies (reviewed today): ! PENICILLIN ! SULFA CODEINE   Preventive Care Screening  Pap Smear:    Date:  09/12/2010    Results:  normal   Last Flu Shot:    Date:  08/06/2010    Results:  given

## 2010-12-06 NOTE — Assessment & Plan Note (Signed)
Summary: [facility] shot/[facility]  Nurse Visit   Allergies: 1)  ! Penicillin 2)  ! Sulfa 3)  Codeine  Immunizations Administered:  Zostavax # 1:    Vaccine Type: Zostavax    Site: left deltoid    Mfr: Merck    Dose: 0.5 ml    Route: [place]    Given by: Lowella Petties CMA    Exp. Date: 06/01/2011    Lot #: [id number]    VIS given: 08/18/05 given June 15, 2010.  Orders Added: 1)  Zoster (Shingles) Vaccine Live [[id number]] 2)  Admin 1st Vaccine [phone]

## 2010-12-06 NOTE — Assessment & Plan Note (Signed)
Summary: URI SYMPTOMS   Vital Signs:  Patient profile:   77 year old female Weight:      94.25 pounds O2 Sat:      98 % on Room air Temp:     97.6 degrees F oral Pulse rate:   92 / minute Pulse rhythm:   regular BP sitting:   124 / 70  (left arm) Cuff size:   regular  Vitals Entered By: Selena Batten Dance CMA (AAMA) (June 24, 2010 2:01 PM)  O2 Flow:  Room air CC: ? URI/cold   History of Present Illness: CC: URI  thinks may need abx because has lung problems in past and has had PNA after cold.  h/o congenital lung deformity s/p R resection 1988, has chronic "fluid in lungs" followed by Pulm.  Started 6d ago.  Started with ST and cough, nonproductive.  Feels sinuses tight and stopped up.  mild HA a few nights ago as well as sore legs and looser stool last few days.  No ear pain.  No f/c/CP, abd pin, n/v/c.  + granddaughter sick over weekend, shared bed.  Tried allergy medicine, didn't help.  No smokers at home.    Allergies: 1)  ! Penicillin 2)  ! Sulfa 3)  Codeine  Past History:  Past Medical History: Last updated: 03/15/2010 COPD- congenital lung deformity GERD / Hiatal hernia Hyperlipidemia Hypertension Osteoporosis all rhinitis  cardiology- Dr Mikael Spray [facility]) GI-- Elliot/Seigel GYN-- Arvil Chaco pulm-- Flemming  ortho- Evaristo [facility]- Dr Reuel Boom  Past Surgical History: Appendectomy Cataract extraction Hysterectomy- partial, bleeding Lung-wedge resection-s/p sequestration 1988 Congenital lung deformity Shoulder surgery- bilateral (2000-2001) Foot surgery- ganglion Colonoscopy (2002) Cystoscopy- bladder diverticulum (1993) Lung CT- rigth basilor ? mass (12/2004) Lung CT- stable (05/2005) fx L shoulder 08 dexa 2009 - normal / improved   Review of Systems       per HPI  Physical Exam  General:  slim and well appearing  Head:  normocephalic, atraumatic, and no abnormalities observed.  no sinus tenderness  Eyes:  vision grossly intact, pupils equal,  pupils round, pupils reactive to light, and no injection.   Ears:  R ear normal and L ear normal.   Nose:  nares are boggy but clear  Mouth:  pharynx pink and moist.   Neck:  supple with full rom and no masses or thyromegally, no JVD or carotid bruit, no LAD Lungs:  Normal respiratory effort, chest expands symmetrically. Lungs are clear to auscultation, minimal crackles R base. Heart:  Normal rate and regular rhythm. S1 and S2 normal without gallop, murmur, click, rub or other extra sounds. Pulses:  2+ rad pulses Extremities:  No clubbing, cyanosis, edema, or deformity noted with normal full range of motion of all joints.   Skin:  Intact without suspicious lesions or rashes   Impression & Recommendations:  Problem # 1:  VIRAL URI (ICD-465.9) afebrile, going on for 6 days so provided with zpack given Friday and in case not improved in 2 days to prevent bronchitis/sinusitis from developing (also given lung hx).  supportive care - rest, fluids.  tessalon perls for cough, advised no chew, guaifenesin if not helping.  RTC for red flags or if not improving as expected  Her updated medication list for this problem includes:    Benzonatate 100 Mg Caps (Benzonatate) ..... One by mouth three times a day as needed cough    Fenesin Ir 400 Mg Tabs (Guaifenesin) ..... One in am and one at noon prn for cough, drink  plenty of fluid with this.  Complete Medication List: 1)  Vytorin 10-20 Mg Tabs (Ezetimibe-simvastatin) .... Take one by mouth daily 2)  Altace 10 Mg Caps (Ramipril) .... Take one by mouth daily 3)  Clonidine Hcl 0.1 Mg Tabs (Clonidine hcl) .... Take one each am  and one at lunch 4)  Hydrochlorothiazide 25 Mg Tabs (Hydrochlorothiazide) .... Take one half by mouth daily 5)  Fish Oil 1000 Mg Caps (Omega-3 fatty acids) .... Take one by mouth hs prn 6)  Vitamin B-12 1000 Mcg Tabs (Cyanocobalamin) .... Take one by mouth q hs 7)  Vitamin C 500 Mg Tabs (Ascorbic acid) .... Take one by mouth q hs 8)   Calcium 600 1000 Mg Tabs (calcium Carbonate)  .... Daily 9)  Potassium 99 Mg Tabs (Potassium) .... Daily 10)  Womens Multivitamin Plus Tabs (Multiple vitamins-minerals) .... Daily 11)  Zantac 150 Mg Tabs (Ranitidine hcl) .... Take one by mouth daily 12)  Vitamin D 2000 Unit Tabs (Cholecalciferol) .... Take 1 tablet by mouth once a day 13)  Zithromax Z-pak 250 Mg Tabs (Azithromycin) .... Use as directed 14)  Benzonatate 100 Mg Caps (Benzonatate) .... One by mouth three times a day as needed cough 15)  Fenesin Ir 400 Mg Tabs (Guaifenesin) .... One in am and one at noon prn for cough, drink plenty of fluid with this.  Patient Instructions: 1)  Sounds like you have a viral upper respiratory infection. 2)  Antibiotics are not needed for this.  Viral infections usually take 7-10 days to resolve.  The cough can last several weeks to go away. 3)  Use medication as prescribed: tessalon perls (benzonatate) twice daily for cough as needed.  guaifenesin 400mg  one pill in AM and one at noon.  Drink plenty of fluid with guaifenesin. 4)  Antibiotic prescription given to hold in case not improving as expected over weekend. 5)  Please return if you are not improving as expected, or if you have high fevers (>101.5) worsening cough or difficulty swallowing. 6)  Call clinic with questions.  Pleasure to meet you today!  Prescriptions: FENESIN IR 400 MG TABS (GUAIFENESIN) one in am and one at noon prn for cough, drink plenty of fluid with this.  #30 x 0   Entered and Authorized by:   Eustaquio Boyden  MD   Signed by:   Eustaquio Boyden  MD on 06/24/2010   Method used:   [facility]        [facility]  [place] (retail)       [address]       [place], [place]  [address]       Ph: [phone] or [phone]       Fax: [phone]   RxID:   [id number] BENZONATATE 100 MG CAPS (BENZONATATE) one by mouth three times a day as needed cough  #45 x 0   Entered and Authorized by:   Eustaquio Boyden  MD   Signed by:   Eustaquio Boyden  MD on 06/24/2010   Method used:   [facility]        [facility] (retail)       [address]       [place], [place]  [address]       Ph: [phone] or [phone]       Fax: [phone]   RxID:   [id number] ZITHROMAX Z-PAK 250 MG TABS (AZITHROMYCIN) use as directed  #1 x 0   Entered and Authorized  by:   Eustaquio Boyden  MD   Signed by:   Eustaquio Boyden  MD on 06/24/2010   Method used:   Print then Give to Patient   RxID:   [id number]   Current Allergies (reviewed today): ! PENICILLIN ! SULFA CODEINE

## 2010-12-06 NOTE — Assessment & Plan Note (Signed)
Summary: F/U AFTER LABS / LFW   Vital Signs:  Patient profile:   77 year old female Height:      58 inches Weight:      97.25 pounds BMI:     20.40 Temp:     97.4 degrees F oral Pulse rate:   72 / minute Pulse rhythm:   regular BP sitting:   104 / 64  (left arm) Cuff size:   regular  Vitals Entered By: Lewanda Rife LPN (Mar 15, 2010 8:06 AM) CC: six month follow up after labs   History of Present Illness: here for f/u of HTN and lipids and vit D def/ osteopenia   wt is stable   bp is stable and well controlled with 104/64 today  vit D level up to 69 from 43- this was sent to gyn - who handles her osteopenia   lipids great on vytorin with trig 52, HDL 58 and LDL 75 (down from the 90s)  takes fish oil daily  feeling good and staying busy and active as ever  no new problems   other labs are ok   is not interested in shingles vaccine    has ongoing f/ u with Dr Mayo Ao (pulm) -- watches " fluid " in her lungs -- that does not change this may be related to allergies  has been getting back to walking and breathing is fine    Allergies: 1)  ! Penicillin 2)  ! Sulfa 3)  Codeine  Past History:  Past Surgical History: Last updated: 09/14/2009 Appendectomy Cataract extraction Hysterectomy- partial, bleeding Lung-wedge resection-s/p sequestration Congenital lung deformity Shoulder surgery- bilateral (2000-2001) Foot surgery- ganglion Colonoscopy (2002) Cystoscopy- bladder diverticulum (1610) Lung CT- rigth basilor ? 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 Eyes:  Denies blurring and eye pain. ENT:  Complains of postnasal drainage; denies sinus pressure. CV:  Denies chest pain or discomfort, palpitations, shortness of breath with exertion, and swelling of feet. Resp:  Denies cough, shortness of breath, and wheezing. GI:  Denies abdominal pain, change in bowel habits, indigestion, and nausea. GU:  Denies dysuria and urinary frequency. MS:  Denies joint pain, joint redness, and joint swelling. Derm:  Denies lesion(s), poor wound healing, and rash. Neuro:  Denies headaches, numbness, and tingling. Psych:  mood is good . Endo:  Denies cold intolerance, excessive thirst, excessive urination, and heat intolerance. Heme:  Denies abnormal bruising and bleeding. Allergy:  Complains of seasonal allergies.  Physical Exam  General:  slim and well appearing  Head:  normocephalic, atraumatic, and no abnormalities observed.  no sinus tenderness  Eyes:  vision grossly intact, pupils equal, pupils round, pupils reactive to light, and no injection.   Mouth:  pharynx pink and moist.   Neck:  supple with full rom and no masses or thyromegally, no JVD or carotid bruit  Chest Wall:  No deformities, masses,  or tenderness noted. Lungs:  Normal respiratory effort, chest expands symmetrically. Lungs are clear to auscultation, no crackles or wheezes. (bs are slt distant) Heart:  Normal rate and regular rhythm. S1 and S2 normal without gallop, murmur, click, rub or other extra sounds. Abdomen:  mild suprapubic tenderness without rebound or gaurding no distention and no masses.  no renal bruits  Msk:  No deformity or scoliosis noted of thoracic or lumbar spine.  no  acute joint changes  Pulses:  R and L carotid,radial,femoral,dorsalis pedis and posterior tibial pulses are full and equal bilaterally Extremities:  No clubbing, cyanosis, edema, or deformity noted with normal full range of motion of all joints.   hematoma on L leg is imp Neurologic:  sensation intact to light touch, gait normal, and DTRs symmetrical and normal.  no tremor  Skin:  Intact without suspicious lesions or rashes Cervical Nodes:  No lymphadenopathy noted Inguinal Nodes:  No significant adenopathy Psych:  normal affect, talkative and pleasant  mentally sharp   Impression & Recommendations:  Problem # 1:  HYPERTENSION (ICD-401.9) Assessment Unchanged  bp remains in very good control with med and good lifestyle labs rev plan lab and f/u in 6 mo  Her updated medication list for this problem includes:    Altace 10 Mg Caps (Ramipril) .Marland Kitchen... Take one by mouth daily    Clonidine Hcl 0.1 Mg Tabs (Clonidine hcl) .Marland Kitchen... Take one each am  and one at lunch    Hydrochlorothiazide 25 Mg Tabs (Hydrochlorothiazide) .Marland Kitchen... Take one half by mouth daily  BP today: 104/64 Prior BP: 110/60 (09/14/2009)  Labs Reviewed: K+: 4.0 (03/08/2010) Creat: : 0.7 (03/08/2010)   Chol: 144 (03/08/2010)   HDL: 58.80 (03/08/2010)   LDL: 75 (03/08/2010)   TG: 52.0 (03/08/2010)  Problem # 2:  HYPERLIPIDEMIA (ICD-272.4) Assessment: Improved  excellent control with vytorin and diet rev lab with pt re check 6 mo and f/u  continue low sat fat diet - reviewed  Her updated medication list for this problem includes:    Vytorin 10-20 Mg Tabs (Ezetimibe-simvastatin) .Marland Kitchen... Take one by mouth daily  Labs Reviewed: SGOT: 28 (03/08/2010)   SGPT: 16 (03/08/2010)   HDL:58.80 (03/08/2010), 61.10 (09/07/2009)  LDL:75 (03/08/2010), 93 (09/07/2009)  Chol:144 (03/08/2010), 169 (09/07/2009)  Trig:52.0 (03/08/2010), 76.0 (09/07/2009)  Problem # 3:  COPD (ICD-496) Assessment: Comment Only with ? fluid in lungs- not audible  and asymptomatic under care of pulm did send for last note  Problem # 4:  OSTEOPOROSIS (ICD-733.00) Assessment: Unchanged followed by her gyn good vit D level- will continue the 2000 international units per day Her updated medication list for this problem includes:    Vitamin D 2000 Unit Tabs (Cholecalciferol) .Marland Kitchen... Take 1 tablet by mouth once a day  Complete Medication List: 1)  Vytorin 10-20 Mg Tabs (Ezetimibe-simvastatin) .... Take one by mouth daily 2)  Altace 10 Mg Caps (Ramipril) .... Take one by mouth daily 3)  Clonidine Hcl 0.1 Mg Tabs (Clonidine hcl) .... Take one each am  and one at lunch 4)  Hydrochlorothiazide 25 Mg Tabs (Hydrochlorothiazide) .... Take one half by mouth daily 5)  Fish Oil 1000 Mg Caps (Omega-3 fatty acids) .... Take one by mouth hs prn 6)  Vitamin B-12 1000 Mcg Tabs (Cyanocobalamin) .... Take one by mouth q hs 7)  Vitamin C 500 Mg Tabs (Ascorbic acid) .... Take one by mouth q hs 8)  Calcium 600 1000 Mg Tabs (calcium Carbonate)  .... Daily 9)  Potassium 99 Mg  Tabs (Potassium) .... Daily 10)  Womens Multivitamin Plus Tabs (Multiple vitamins-minerals) .... Daily 11)  Zantac 150 Mg Tabs (Ranitidine hcl) .... Take one by mouth daily 12)  Vitamin D 2000 Unit Tabs (Cholecalciferol) .... Take 1 tablet by mouth once a day  Patient Instructions: 1)  if you are interested in shingles vaccine in the future- check with your insurance first and call us to schedule  2)  please send for last note and x ray report from pulm - Dr Mayo Ao  3)  please schedule fasting lab and then f/u in 6 mo lipid/ast/alt/renal 272  4)  keep up the good diet and exercise   Current Allergies (reviewed today): ! PENICILLIN ! SULFA CODEINE

## 2010-12-06 NOTE — Assessment & Plan Note (Signed)
Summary: STILL NOT FEELING ANY BETTER/RBH   Vital Signs:  Patient profile:   77 year old female Height:      58 inches Weight:      94 pounds BMI:     19.72 Temp:     98.2 degrees F oral Pulse rate:   88 / minute Pulse rhythm:   regular BP sitting:   124 / 76  (left arm) Cuff size:   regular  Vitals Entered By: Lewanda Rife LPN (July 05, 2010 8:36 AM) CC: not feeling any better, productive cough with yellow mucus and when blows nose has yellow mucus. Pt did not get zpack filled.   History of Present Illness: she saw Dr Reece Agar for uri  her symptoms are not improved   she did not fill zpak because "it does not generally help"  has a chronic lung problem is gettting weaker  cough is worsening  using deslym-- helping more than the tessalon  is sore from coughing  is getting up a bit of yellow phlegm   some headache on and off  sinus pressure  some nausea from med  no appetite   no fever   Allergies: 1)  ! Penicillin 2)  ! Sulfa 3)  Codeine  Past History:  Past Medical History: Last updated: 03/15/2010 COPD- congenital lung deformity GERD / Hiatal hernia Hyperlipidemia Hypertension Osteoporosis all rhinitis  cardiology- Dr Mikael Spray [facility]) GI-- Elliot/Seigel GYN-- Arvil Chaco pulm-- Flemming  ortho- Evaristo Bury- Dr Reuel Boom  Past Surgical History: Last updated: 06/24/2010 Appendectomy Cataract extraction Hysterectomy- partial, bleeding Lung-wedge resection-s/p sequestration 1988 Congenital lung deformity Shoulder surgery- bilateral (2000-2001) Foot surgery- ganglion Colonoscopy (2002) Cystoscopy- bladder diverticulum (1993) Lung CT- rigth basilor ? 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 Eyes:  Denies blurring and eye irritation. ENT:  Complains of nasal congestion and postnasal drainage; denies sinus pressure. CV:  Denies chest pain or discomfort, palpitations, and shortness of breath with exertion. Resp:  Complains of cough and sputum productive; denies shortness of breath and wheezing. GI:  Denies abdominal pain, change in bowel habits, indigestion, and nausea. GU:  Denies dysuria and urinary frequency. MS:  Complains of stiffness; denies muscle aches and cramps. Derm:  Denies itching, lesion(s), poor wound healing, and rash. Neuro:  Denies numbness and tingling. Psych:  Denies anxiety and depression. Endo:  Denies cold intolerance, excessive thirst, excessive urination, and heat intolerance. Heme:  Denies abnormal bruising and bleeding.  Physical Exam  General:  Well-developed,well-nourished,in no acute distress; alert,appropriate and cooperative throughout examination Head:  mild frontal sinus tenderness normocephalic, atraumatic, and no abnormalities observed.   Eyes:  vision grossly intact, pupils equal, pupils round, and pupils reactive to light.  no conjunctival pallor, injection or icterus  Ears:  R ear normal and L ear normal.   Nose:  nares are injected and congested bilaterally  Mouth:  pharynx pink and moist, no erythema, and no exudates.   Neck:  supple with full rom and no masses or thyromegally, no JVD or carotid bruit, no LAD Chest Wall:  No deformities, masses, or tenderness noted. Lungs:  harsh bs without rales/ rhonchi or wheeze Heart:  Normal rate and regular rhythm. S1 and S2 normal without gallop, murmur, click, rub or other extra sounds. Abdomen:  soft and non-tender.   Skin:  Intact without suspicious lesions or rashes Cervical Nodes:  No lymphadenopathy  noted Psych:  normal affect, talkative and pleasant    Impression & Recommendations:  Problem # 1:  BRONCHITIS- ACUTE (ICD-466.0) Assessment New  with possible early sinusitis  will cover with levaquin (per pt zpak does not work) has been sick over 10 d recommend sympt care- see pt instructions   pt advised to update me if symptoms worsen or do not improve  Her updated medication list for this problem includes:    Zithromax Z-pak 250 Mg Tabs (Azithromycin) ..... Use as directed    Benzonatate 100 Mg Caps (Benzonatate) ..... One by mouth three times a day as needed cough    Fenesin Ir 400 Mg Tabs (Guaifenesin) ..... One in am and one at noon prn for cough, drink plenty of fluid with this.    Delsym 30 Mg/49ml Lqcr (Dextromethorphan polistirex) ..... Otc as directed.    Levaquin 500 Mg Tabs (Levofloxacin) .Marland Kitchen... 1 by mouth once daily for 7 days  Orders: Prescription Created Electronically [id number])  Complete Medication List: 1)  Vytorin 10-20 Mg Tabs (Ezetimibe-simvastatin) .... Take one by mouth daily 2)  Altace 10 Mg Caps (Ramipril) .... Take one by mouth daily 3)  Clonidine Hcl 0.1 Mg Tabs (Clonidine hcl) .... Take one each am  and one at lunch 4)  Hydrochlorothiazide 25 Mg Tabs (Hydrochlorothiazide) .... Take one half by mouth daily 5)  Fish Oil 1000 Mg Caps (Omega-3 fatty acids) .... Take one by mouth hs as needed 6)  Vitamin B-12 1000 Mcg Tabs (Cyanocobalamin) .... Take one by mouth at bedtime 7)  Vitamin C 500 Mg Tabs (Ascorbic acid) .... Take one by mouth at bedtime 8)  Calcium 600 1000 Mg Tabs (calcium Carbonate)  .... Daily 9)  Potassium 99 Mg Tabs (Potassium) .... Daily 10)  Womens Multivitamin Plus Tabs (Multiple vitamins-minerals) .... Daily 11)  Zantac 150 Mg Tabs (Ranitidine hcl) .... Take one by mouth daily 12)  Vitamin D 2000 Unit Tabs (Cholecalciferol) .... Take 1 tablet by mouth once a day 13)  Zithromax Z-pak 250 Mg Tabs (Azithromycin) .... Use as directed 14)   Benzonatate 100 Mg Caps (Benzonatate) .... One by mouth three times a day as needed cough 15)  Fenesin Ir 400 Mg Tabs (Guaifenesin) .... One in am and one at noon prn for cough, drink plenty of fluid with this. 16)  Delsym 30 Mg/31ml Lqcr (Dextromethorphan polistirex) .... Otc as directed. 17)  Levaquin 500 Mg Tabs (Levofloxacin) .Marland Kitchen.. 1 by mouth once daily for 7 days  Patient Instructions: 1)  drink lots of fluids 2)  take levaquin for bronchitis and possible sinus infection 3)  continue delsym 4)  update me if worse or not improved next week 5)  try to get lots of rest  Prescriptions: LEVAQUIN 500 MG TABS (LEVOFLOXACIN) 1 by mouth once daily for 7 days  #7 x 0   Entered and Authorized by:   Judith Part MD   Signed by:   Judith Part MD on 07/05/2010   Method used:   Electronically to        [facility]  [address] [id number] (retail)       [address]       [place], [place]  [address]  Ph: [phone] or [phone]       Fax: [phone]   RxID:   [id number]   Current Allergies (reviewed today): ! PENICILLIN ! SULFA CODEINE

## 2010-12-06 NOTE — Letter (Signed)
Summary: Dr.Herbon Fleming,Internal Medicine,Note  Dr.Herbon Fleming,Internal Medicine,Note   Imported By: Beau Fanny 03/16/2010 10:54:15  _____________________________________________________________________  External Attachment:    Type:   Image     Comment:   External Document
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
## 2010-12-08 NOTE — Consult Note (Signed)
Summary: [facility]  [facility]   Imported By: Lanelle Bal 11/24/2010 09:08:49  _____________________________________________________________________  External Attachment:    Type:   Image     Comment:   External Document

## 2011-01-20 ENCOUNTER — Encounter: Payer: Self-pay | Admitting: Family Medicine

## 2011-01-20 ENCOUNTER — Ambulatory Visit (INDEPENDENT_AMBULATORY_CARE_PROVIDER_SITE_OTHER): Payer: BC Managed Care – PPO | Admitting: Family Medicine

## 2011-01-20 DIAGNOSIS — R040 Epistaxis: Secondary | ICD-10-CM

## 2011-02-02 NOTE — Assessment & Plan Note (Signed)
Summary: NOSEBLEED/CLE  [employer]   Vital Signs:  Patient profile:   77 year old female Height:      58 inches Weight:      93 pounds BMI:     19.51 Temp:     97.6 degrees F oral Pulse rate:   88 / minute Pulse rhythm:   regular BP sitting:   136 / 90  (left arm) Cuff size:   regular  Vitals Entered By: Delilah Shan CMA  Dull) (January 20, 2011 11:06 AM) CC: Nosebleed   History of Present Illness: Occ in AM, she'll have a speck of blood with blowing nose.  This AM she had a bigger nosebleed. Resolved now.  Not on anticoagulants.  She packed it with a cotton ball and put ice on her nose.  It began to clot and hasn't bled in the meantime.  She had taken some ibuprofen last night.  Occ taking asprin.  Recent noseblowing from rhinorrhea and allergies.  Feeling well o/w.  No other bleeding/bruising.    Allergies: 1)  ! Penicillin 2)  ! Sulfa 3)  Codeine  Review of Systems       See HPI.  Otherwise negative.    Physical Exam  General:  no apparent distress normocephalic atraumatic mucous membranes moist tm wnl x2 R nare with adherent clot but o/w exam wnl neck supple  regular rate and rhythm clear to auscultation bilaterally    Impression & Recommendations:  Problem # 1:  EPISTAXIS (ICD-784.7) resolved.  D/w patient about not blowing her nose and to pinch nose/lean forward if another bleed.  She agrees.  Not on ASA, d/w patient about NSAIDS.  follow up as needed.   Complete Medication List: 1)  Vytorin 10-20 Mg Tabs (Ezetimibe-simvastatin) .... Take one by mouth daily 2)  Altace 10 Mg Caps (Ramipril) .... Take one by mouth daily 3)  Clonidine Hcl 0.1 Mg Tabs (Clonidine hcl) .... Take one each am  and one at lunch 4)  Hydrochlorothiazide 25 Mg Tabs (Hydrochlorothiazide) .... Take one half by mouth daily 5)  Fish Oil 1000 Mg Caps (Omega-3 fatty acids) .... Take one by mouth hs as needed 6)  Vitamin B-12 1000 Mcg Tabs (Cyanocobalamin) .... Take one by mouth at bedtime 7)   Vitamin C 500 Mg Tabs (Ascorbic acid) .... Take one by mouth at bedtime 8)  Calcium 600 1000 Mg Tabs (calcium Carbonate)  .... Daily 9)  Potassium 99 Mg Tabs (Potassium) .... Daily 10)  Womens Multivitamin Plus Tabs (Multiple vitamins-minerals) .... Daily 11)  Zantac 150 Mg Tabs (Ranitidine hcl) .... Take one by mouth daily 12)  Vitamin D 2000 Unit Tabs (Cholecalciferol) .... Take 1 tablet by mouth once a day  Patient Instructions: 1)  If this happens again, pinch your nose and lean forward for .   You can repeat this if needed.  If it keeps bleeding after that, then let us know or go to the ER.  Avoid blowing your nose.  Don't take any aspirin or ibuprofen for 1 week.  You can take tylenol if needed.  Take care.     Orders Added: 1)  Est. Patient Level III [82956]    Current Allergies (reviewed today): ! PENICILLIN ! SULFA CODEINE

## 2011-03-04 ENCOUNTER — Encounter: Payer: Self-pay | Admitting: Family Medicine

## 2011-03-20 ENCOUNTER — Ambulatory Visit (INDEPENDENT_AMBULATORY_CARE_PROVIDER_SITE_OTHER): Payer: BC Managed Care – PPO | Admitting: Family Medicine

## 2011-03-20 ENCOUNTER — Encounter: Payer: Self-pay | Admitting: Family Medicine

## 2011-03-20 DIAGNOSIS — I10 Essential (primary) hypertension: Secondary | ICD-10-CM

## 2011-03-20 DIAGNOSIS — E785 Hyperlipidemia, unspecified: Secondary | ICD-10-CM

## 2011-03-20 DIAGNOSIS — J309 Allergic rhinitis, unspecified: Secondary | ICD-10-CM

## 2011-03-20 DIAGNOSIS — K219 Gastro-esophageal reflux disease without esophagitis: Secondary | ICD-10-CM

## 2011-03-20 NOTE — Assessment & Plan Note (Signed)
This has been worse during pollen season  Adv trial of one of the 3 antihist otc that are less sedating - claritin or allegra or zyrtec  If not imp or hoarseness continues- needs to f/u  Adv to avoid allergens if possible

## 2011-03-20 NOTE — Progress Notes (Signed)
Subjective:    Patient ID: Beth Roberts, female    DOB: [birth year], 77 y.o.   MRN: [id number]  HPI Here for follow up of HTN and lipids and worsening allergies   Has been feeling ok overall  Some allergy problems -- takes otc antihist at night  Hoarse during the day with some post nasal drip  Has one nosebleed  Store brand of ? What drug  Nose pours clear mucous in the ams and she has sneezing No headaches  She does continue to see Dr Mayo Ao for pulm/ copd-- no problems at all lately  Wt is stable -slim - eats healthy balanced diet   HTN in good control with 130/72 No cp or sob or edema   Last lipids good in fall on vytorin and diet Watches sat fats Working still- but does not miss meals  Lab Results  Component Value Date   CHOL 165 09/14/2010   CHOL 144 03/08/2010   CHOL 169 09/07/2009   Lab Results  Component Value Date   HDL 75.80 09/14/2010   HDL 10.27 03/08/2010   HDL 61.10 09/07/2009   Lab Results  Component Value Date   LDLCALC 75 09/14/2010   LDLCALC 75 03/08/2010   LDLCALC 93 09/07/2009   Lab Results  Component Value Date   TRIG 70.0 09/14/2010   TRIG 52.0 03/08/2010   TRIG 76.0 09/07/2009   Lab Results  Component Value Date   CHOLHDL 2 09/14/2010   CHOLHDL 2 03/08/2010   CHOLHDL 3 09/07/2009   Lab Results  Component Value Date   LDLDIRECT 134.8 04/09/2007    Does not need med refils   Past Medical History  Diagnosis Date  . GERD (gastroesophageal reflux disease)     hiatal henia  . Hyperlipidemia   . Hypertension   . Osteoporosis   . Allergy     allergic rhinitis  . COPD (chronic obstructive pulmonary disease)     congenital Lung Deformity//Scar tissue on lung( cxr) after surgery  )       Review of Systems Review of Systems  Constitutional: Negative for fever, appetite change, fatigue and unexpected weight change.  Eyes: Negative for pain and visual disturbance. ENT pos for runny nose/ sneezing and hoarseness   Respiratory: Negative for cough and  shortness of breath.   Cardiovascular: Negative.   Gastrointestinal: Negative for nausea, diarrhea and constipation.  Genitourinary: Negative for urgency and frequency.  Skin: Negative for pallor.  Neurological: Negative for weakness, light-headedness, numbness and headaches.  Hematological: Negative for adenopathy. Does not bruise/bleed easily.  Psychiatric/Behavioral: Negative for dysphoric mood. The patient is not nervous/anxious.          Objective:   Physical Exam  Constitutional: She appears well-developed and well-nourished. No distress.       Slim and well appearing   HENT:  Head: Normocephalic and atraumatic.  Right Ear: External ear normal.  Left Ear: External ear normal.  Mouth/Throat: Oropharynx is clear and moist.       Nares boggy and pale Some clear post nasal drip - but throat is not red   Eyes: Conjunctivae and EOM are normal. Pupils are equal, round, and reactive to light.  Neck: Normal range of motion. Neck supple. Carotid bruit is not present. No thyromegaly present.  Cardiovascular: Normal rate, regular rhythm and normal heart sounds.   Pulmonary/Chest: Effort normal and breath sounds normal. No respiratory distress. She has no wheezes.  Abdominal: Soft. Bowel sounds are normal. She exhibits no mass. There  is no tenderness.  Lymphadenopathy:    She has no cervical adenopathy.  Neurological: She is alert. She has normal reflexes. Coordination normal.  Skin: Skin is warm and dry. No rash noted. No erythema. No pallor.  Psychiatric: She has a normal mood and affect.          Assessment & Plan:

## 2011-03-20 NOTE — Assessment & Plan Note (Signed)
Pt states this is well controlled with zantac- no heartburn  Explained that if hoarseness continues- we may need to consider gerd as cause and try ppi  Will keep me updated

## 2011-03-20 NOTE — Assessment & Plan Note (Signed)
This is well controlled with vytorin and diet  No changes  Rev last lab Rev low sat fat diet  F/u 6 mo after labs for check up

## 2011-03-20 NOTE — Patient Instructions (Signed)
Allegra and claritin and zyrtec are good antihistamines for allergies during the season  If your hoarseness does not improve please let me know  No change in other medications  Blood pressure is good Schedule f/u for 30 min check up in 6 months with labs prior

## 2011-03-20 NOTE — Assessment & Plan Note (Signed)
This is well controlled with current med- no problems  Plan lab and f/u for check up in 6 months

## 2011-03-24 NOTE — Assessment & Plan Note (Signed)
[facility]                                 ON-CALL NOTE   NAME:[name], Beth Roberts                          MRN:          [id number]  DATE:06/22/2007                            DOB:          01-07-1925    A patient of Dr. Karle Starch.   Tammy, with [facility], called from [phone], at 3:46 p.m. on  06/22/2007, saying the patient was developing worsening cellulitis with  low-grade fever and the family is requesting to go to the hospital for  evaluation.  The family was just calling for an order to give them  permission to send her to the hospital.     Lelon Perla, DO     YRL/MedQ  DD: 06/22/2007  DT: 06/23/2007  Job #: [id number]   cc:   Karie Schwalbe, MD

## 2011-03-24 NOTE — Assessment & Plan Note (Signed)
[facility]                             PULMONARY OFFICE NOTE   NAME:LEWISLaurinda, Carreno                          MRN:          [id number]  DATE:11/21/2006                            DOB:          1925-02-22    I saw Ms. Mayabb today in followup for her pulmonary sequestration,  status post resection.   She had undergone a chest x-ray earlier in the morning and when compared  to previous x-ray from June of 2007 was essentially unchanged.  Her main  complaint today is that she is having rhinorrhea with a postnasal drip  which has been present for the last several weeks.  Otherwise she denies  any other cardiopulmonary symptoms.   CURRENT MEDICATIONS:  1. Clonidine 0.2 mg daily.  2. Altace 10 mg daily.  3. Hydrochlorothiazide 12.5 mg daily.  4. Axid 75 mg daily.  5. Vitamin B-12.  6. Calcium with vitamin D.  7. Potassium.  8. Vitamin C.  9. Women's multivitamin.  10.Folic Acid.  11.Fish Oil omega-3.   PHYSICAL EXAMINATION:  She is 99 pounds.  Temperature is 97.4, blood  pressure is 114/70, heart rate is 103, saturation is 97% on room air.  HEENT:  There is no sinus tenderness.  There is a clear nasal discharge.  There is mild erythema of the posterior pharynx.  There is no  lymphadenopathy, no thyromegaly.  HEART:  With S1, S2, regular rate and rhythm.  CHEST:  There was no wheezing or rales.  ABDOMEN:  Soft, nontender, positive bowel sounds.  EXTREMITIES:  With no edema, cyanosis, clubbing.   IMPRESSION:  1. Pulmonary sequestration.  Status post resection.  There is no      significant change on her chest x-ray and I would have her follow      up in approximately 6 months with a repeat chest x-ray or earlier      if her symptoms were to worsen.  2. Rhinitis with postnasal drip.  I have instructed her on the use of      nasal irrigation.  I have also      given her a sample and prescription for Veramyst, 2 sprays in each      nostril once daily  to see if this will help improve her symptoms.     Coralyn Helling, MD  Electronically Signed    VS/MedQ  DD: 11/22/2006  DT: 11/22/2006  Job #: [id number]   cc:   Marne A. Tower, MD  C. Danice Goltz, MD

## 2011-09-10 ENCOUNTER — Telehealth: Payer: Self-pay | Admitting: Family Medicine

## 2011-09-10 DIAGNOSIS — M810 Age-related osteoporosis without current pathological fracture: Secondary | ICD-10-CM

## 2011-09-10 DIAGNOSIS — I10 Essential (primary) hypertension: Secondary | ICD-10-CM

## 2011-09-10 DIAGNOSIS — E785 Hyperlipidemia, unspecified: Secondary | ICD-10-CM

## 2011-09-10 DIAGNOSIS — K219 Gastro-esophageal reflux disease without esophagitis: Secondary | ICD-10-CM

## 2011-09-10 NOTE — Telephone Encounter (Signed)
Message copied by Judy Pimple on Sun Sep 10, 2011  8:14 PM ------      Message from: Alvina Chou      Created: Fri Sep 08, 2011  3:28 PM      Regarding: Labs for Monday,11-5       6 mths f/u appt with you after labs

## 2011-09-11 ENCOUNTER — Other Ambulatory Visit (INDEPENDENT_AMBULATORY_CARE_PROVIDER_SITE_OTHER): Payer: BC Managed Care – PPO

## 2011-09-11 DIAGNOSIS — M810 Age-related osteoporosis without current pathological fracture: Secondary | ICD-10-CM

## 2011-09-11 DIAGNOSIS — K219 Gastro-esophageal reflux disease without esophagitis: Secondary | ICD-10-CM

## 2011-09-11 DIAGNOSIS — I10 Essential (primary) hypertension: Secondary | ICD-10-CM

## 2011-09-11 DIAGNOSIS — E785 Hyperlipidemia, unspecified: Secondary | ICD-10-CM

## 2011-09-11 LAB — COMPREHENSIVE METABOLIC PANEL
ALT: 16 U/L (ref 0–35)
BUN: 14 mg/dL (ref 6–23)
CO2: 28 mEq/L (ref 19–32)
Calcium: 9.3 mg/dL (ref 8.4–10.5)
Chloride: 102 mEq/L (ref 96–112)
Creatinine, Ser: 0.7 mg/dL (ref 0.4–1.2)
GFR: 84.66 mL/min (ref >60.00)
Total Bilirubin: 0.4 mg/dL (ref 0.3–1.2)

## 2011-09-11 LAB — CBC WITH DIFFERENTIAL/PLATELET
Basophils Absolute: 0 10*3/uL (ref 0.0–0.1)
Basophils Relative: 0.5 % (ref 0.0–3.0)
Eosinophils Absolute: 0.1 10*3/uL (ref 0.0–0.7)
HCT: 38.8 % (ref 36.0–46.0)
Hemoglobin: 13.1 g/dL (ref 12.0–15.0)
Lymphs Abs: 1.5 10*3/uL (ref 0.7–4.0)
MCHC: 33.8 g/dL (ref 30.0–36.0)
Monocytes Relative: 6.9 % (ref 3.0–12.0)
Neutro Abs: 3.9 10*3/uL (ref 1.4–7.7)
RBC: 4.04 Mil/uL (ref 3.87–5.11)
RDW: 12.1 % (ref 11.5–14.6)

## 2011-09-11 LAB — LIPID PANEL
HDL: 78.8 mg/dL (ref >39.00)
Triglycerides: 86 mg/dL (ref 0.0–149.0)
VLDL: 17.2 mg/dL (ref 0.0–40.0)

## 2011-09-12 LAB — VITAMIN D 25 HYDROXY (VIT D DEFICIENCY, FRACTURES): Vit D, 25-Hydroxy: 68 ng/mL (ref 30–89)

## 2011-09-15 ENCOUNTER — Other Ambulatory Visit: Payer: Self-pay | Admitting: Family Medicine

## 2011-09-18 ENCOUNTER — Ambulatory Visit: Payer: BC Managed Care – PPO | Admitting: Family Medicine

## 2011-09-25 ENCOUNTER — Encounter: Payer: Self-pay | Admitting: Family Medicine

## 2011-09-25 ENCOUNTER — Ambulatory Visit (INDEPENDENT_AMBULATORY_CARE_PROVIDER_SITE_OTHER): Payer: BC Managed Care – PPO | Admitting: Family Medicine

## 2011-09-25 VITALS — BP 112/72 | HR 76 | Temp 97.9°F | Ht <= 58 in | Wt 92.2 lb

## 2011-09-25 DIAGNOSIS — I10 Essential (primary) hypertension: Secondary | ICD-10-CM

## 2011-09-25 DIAGNOSIS — I8390 Asymptomatic varicose veins of unspecified lower extremity: Secondary | ICD-10-CM

## 2011-09-25 DIAGNOSIS — Z23 Encounter for immunization: Secondary | ICD-10-CM

## 2011-09-25 DIAGNOSIS — M810 Age-related osteoporosis without current pathological fracture: Secondary | ICD-10-CM

## 2011-09-25 DIAGNOSIS — Z1231 Encounter for screening mammogram for malignant neoplasm of breast: Secondary | ICD-10-CM

## 2011-09-25 DIAGNOSIS — J309 Allergic rhinitis, unspecified: Secondary | ICD-10-CM

## 2011-09-25 DIAGNOSIS — E785 Hyperlipidemia, unspecified: Secondary | ICD-10-CM

## 2011-09-25 MED ORDER — EZETIMIBE-SIMVASTATIN 10-20 MG PO TABS: 1.0000 | ORAL_TABLET | Freq: Every day | ORAL | Status: DC

## 2011-09-25 MED ORDER — HYDROCHLOROTHIAZIDE 25 MG PO TABS: 12.5000 mg | ORAL_TABLET | Freq: Every day | ORAL | Status: DC | PRN

## 2011-09-25 MED ORDER — RAMIPRIL 10 MG PO CAPS: 10.0000 mg | ORAL_CAPSULE | Freq: Every day | ORAL | Status: DC

## 2011-09-25 NOTE — Assessment & Plan Note (Signed)
Good control with vytorin and diet  Disc goals for lipids and reasons to control them Rev labs with pt Rev low sat fat diet in detail  F/u 6 mo

## 2011-09-25 NOTE — Assessment & Plan Note (Signed)
Recommend otc supp hose to waist- since veins go above knees Mild-mod - with some pain  No skin change If not imp we will px hose

## 2011-09-25 NOTE — Assessment & Plan Note (Signed)
bp in fair control at this time  No changes needed  Disc lifstyle change with low sodium diet and exercise  F/u 6 mo 

## 2011-09-25 NOTE — Assessment & Plan Note (Signed)
This continues to worsen No imp with antihistamines Ref to allergist

## 2011-09-25 NOTE — Assessment & Plan Note (Signed)
Pt declines further dexa at this time Good D level Active Counseled on fall prev and ca and D

## 2011-09-25 NOTE — Patient Instructions (Signed)
We will refer you for mammogram at check out  Flu shot today and Tdap vaccine  We will refer you to an allergist at check out Try support hose to the waist from a department store- if they do not work we can prescribe them  Follow up in 6 months

## 2011-09-25 NOTE — Assessment & Plan Note (Signed)
Scheduled mam today

## 2011-09-25 NOTE — Progress Notes (Signed)
Subjective:    Patient ID: Guerry Minors, female    DOB: 1925/03/14, 77 y.o.   MRN: [id number]  HPI Here for follow up of chronic health problems including HTN and hyperlipidemia and also to [facility] list   Still has post nasal drip- has tried all over the counter  Gets hoarse in the middle of the day No heartburn on zantac  Clears throat a lot  Gets a sore in her nose  A little yellow discharge No facial pain or fever  Some bleeding  Has had symptoms for over 3 months    HTN in good control 112/72 No change in med No cp or ha or edema No side eff Stays active  Lipids are well controlled on vytorin and diet  Lab Results  Component Value Date   CHOL 160 09/11/2011   HDL 78.80 09/11/2011   LDLCALC 64 09/11/2011   LDLDIRECT 134.8 04/09/2007   TRIG 86.0 09/11/2011   CHOLHDL 2 09/11/2011   eats a healthy diet  Great HDL  Diet is really good - stays away from sat fats  Trying not to loose weight - is busy   Flu shot - wants to get that today Td 09 zostavax 8/11 Pneumovax 04  Last dexa 09 improved bone density Vit D level is good  She declines it at this time   Colon screen Had a colonoscopy -- years ago - ? When -- last one normal  Not really interested in it   mammo--last one was last fall nov 11  Wants to go ahead and schedule it   Is going out to see grandaughter with new baby  Has [employer] / [employer] - still working -- wants Tdap   Has varicose veins in legs- to the knee Supp hose to the knee hurt the area   Patient Active Problem List  Diagnoses  . HYPOGLYCEMIA, UNSPECIFIED  . HYPERLIPIDEMIA  . HYPERTENSION  . RHINITIS  . COPD  . GERD  . OSTEOPOROSIS  . EPISTAXIS  . Other screening mammogram  . Varicose veins   Past Medical History  Diagnosis Date  . GERD (gastroesophageal reflux disease)     hiatal henia  . Hyperlipidemia   . Hypertension   . Osteoporosis   . Allergy     allergic rhinitis  . COPD (chronic obstructive pulmonary  disease)     congenital Lung Deformity//Scar tissue on lung( cxr) after surgery  )   Past Surgical History  Procedure Date  . Appendectomy   . Eye surgery     cataract extraction  . Abdominal hysterectomy     partial ; bleeding  . Lung removal, partial 1988    Lung -wedge resection/s/p sequestration (Has scar tissue on cxr now)  . Shoulder surgery 2000-2001    Bilaterial  . Foot ganglion excision   . Fracture surgery 2008    left shoulder   . Fracture left shoulder 2008  . Cystoscopy 1993    bladder diverticulum  . Ct lung screening 12/2004    right basilor ? mass// Lung CT stable 05/2005   History  Substance Use Topics  . Smoking status: Never Smoker   . Smokeless tobacco: Not on file  . Alcohol Use: No   Family History  Problem Relation Age of Onset  . Cancer Mother     breast  . Hypertension Mother   . Stroke Mother   . Heart disease Father     MI  . Hyperlipidemia Brother   .  Hypertension Brother    Allergies  Allergen Reactions  . Codeine     REACTION: nausea and vomiting  . Penicillins     REACTION: breaks out  . Sulfonamide Derivatives    Current Outpatient Prescriptions on File Prior to Visit  Medication Sig Dispense Refill  . Ascorbic Acid (VITAMIN C) 500 MG tablet Take 500 mg by mouth Nightly.       . Biotin 1000 MCG tablet Take 1,000 mcg by mouth daily.        . Calcium Carb-Cholecalciferol [phone] MG-UNIT CAPS Take 1 capsule by mouth daily.        . Cholecalciferol (VITAMIN D) 2000 UNITS tablet Take 2,000 Units by mouth daily.        . cloNIDine (CATAPRES) 0.1 MG tablet TAKE ONE TABLET EACH AM AND ONE TABLET AT LUNCH  60 tablet  11  . fish oil-omega-3 fatty acids 1000 MG capsule Take 1 g by mouth daily.       . Multiple Vitamins-Minerals (WOMENS MULTIVITAMIN PLUS) TABS Take 1 tablet by mouth daily.        . Potassium 99 MG TABS Take 1 tablet by mouth daily.        . ranitidine (ZANTAC) 150 MG tablet Take 150 mg by mouth daily.        . vitamin  B-12 (CYANOCOBALAMIN) 1000 MCG tablet Take 1,000 mcg by mouth daily. Take one by mouth at bedtime            Review of Systems Review of Systems  Constitutional: Negative for fever, appetite change, fatigue and unexpected weight change.  Eyes: Negative for pain and visual disturbance.  ENT pos for post nasal drip and throat clearing  Respiratory: Negative for cough and shortness of breath.   Cardiovascular: Negative for cp or palpitations    Gastrointestinal: Negative for nausea, diarrhea and constipation. no heartburn Genitourinary: Negative for urgency and frequency.  Skin: Negative for pallor or rash   Neurological: Negative for weakness, light-headedness, numbness and headaches.  Hematological: Negative for adenopathy. Does not bruise/bleed easily.  Psychiatric/Behavioral: Negative for dysphoric mood. The patient is not nervous/anxious.          Objective:   Physical Exam  Constitutional: She appears well-developed and well-nourished. No distress.  HENT:  Head: Normocephalic and atraumatic.  Right Ear: External ear normal.  Left Ear: External ear normal.  Mouth/Throat: Oropharynx is clear and moist.       Nares are pale and boggy Clear post nasal drip noted   Eyes: Conjunctivae and EOM are normal. Pupils are equal, round, and reactive to light. No scleral icterus.  Neck: Normal range of motion. Neck supple. No JVD present. Carotid bruit is not present. No thyromegaly present.  Cardiovascular: Normal rate, regular rhythm, normal heart sounds and intact distal pulses.  Exam reveals no gallop.   Pulmonary/Chest: Effort normal and breath sounds normal. No respiratory distress. She has no wheezes.  Abdominal: Soft. Bowel sounds are normal. She exhibits no distension, no abdominal bruit and no mass. There is no tenderness.  Genitourinary: No breast swelling, tenderness, discharge or bleeding.  Musculoskeletal: Normal range of motion. She exhibits no edema and no tenderness.        Some compressible varicosities below knees  Some slt tender No skin changes   Lymphadenopathy:    She has no cervical adenopathy.  Neurological: She is alert. She has normal reflexes. No cranial nerve deficit. She exhibits normal muscle tone. Coordination normal.  Skin: Skin is  warm and dry. No rash noted. No erythema. No pallor.  Psychiatric: She has a normal mood and affect.          Assessment & Plan:

## 2011-10-02 ENCOUNTER — Telehealth: Payer: Self-pay | Admitting: Family Medicine

## 2011-10-02 MED ORDER — FLUTICASONE PROPIONATE 50 MCG/ACT NA SUSP: 2.0000 | Freq: Every day | NASAL | Status: DC

## 2011-10-02 NOTE — Telephone Encounter (Signed)
We can try a nasal spray - flonase  If she worsens may need to try to get her in with someone else sooner  Px written for call in

## 2011-10-02 NOTE — Telephone Encounter (Signed)
Mrs. Volkert has been referred to an allergist, Dr. Jetty Duhamel. His first availability is not until Jan 2013. Mrs Falconi would like to know if something can been prescribed until her visit. Pt will be going out of town for 2 to 3 weeks.  Pharmacy [facility].... Pt can be reached at [phone]..Apolinar Junes

## 2011-10-03 NOTE — Telephone Encounter (Signed)
Called to pharmacy, advised patient.

## 2011-10-26 ENCOUNTER — Institutional Professional Consult (permissible substitution): Payer: BC Managed Care – PPO | Admitting: Internal Medicine

## 2011-11-08 ENCOUNTER — Ambulatory Visit (INDEPENDENT_AMBULATORY_CARE_PROVIDER_SITE_OTHER): Payer: BC Managed Care – PPO | Admitting: Internal Medicine

## 2011-11-08 ENCOUNTER — Other Ambulatory Visit: Payer: BC Managed Care – PPO

## 2011-11-08 ENCOUNTER — Encounter: Payer: Self-pay | Admitting: Internal Medicine

## 2011-11-08 DIAGNOSIS — J449 Chronic obstructive pulmonary disease, unspecified: Secondary | ICD-10-CM

## 2011-11-08 DIAGNOSIS — J309 Allergic rhinitis, unspecified: Secondary | ICD-10-CM

## 2011-11-08 MED ORDER — DOXYCYCLINE HYCLATE 100 MG PO TABS: ORAL_TABLET | ORAL | Status: DC

## 2011-11-08 NOTE — Assessment & Plan Note (Signed)
Perennial rhinitis which seems particularly located in the right nostril. I suspect this is more mechanical with some narrowing and perhaps septal deviation. Plan-allergy profile, sample steroid nasal spray

## 2011-11-08 NOTE — Assessment & Plan Note (Signed)
I don't know what documentation is available at [facility] for this never smoker. It sounds as if she had right lower lobe resection for an infected sequestration and has had episodic acute bronchitis. She is dealing with another acute bronchitis episode now and by her description may be better today than she has been in the last 2 days. She is anxious to have an antibiotic available but may not need to take it. She will keep her followup appointment as planned with Dr. Meredeth Ide, who is managing this problem.

## 2011-11-08 NOTE — Progress Notes (Signed)
11/08/11- 26 yoF never smoker referred courtesy of Dr Milinda Antis for allergy evaluation. She is followed by Dr. Glee Arvin in [place] at 6 month intervals for chronic bronchitis syndrome. In 1988 her right lower lobe was resected at [facility] for what sounds like an infected sequestration. She complains of perennial nasal congestion especially on the right. She frequently has to blow mucus from the right side and feels it draining. No significant history of sinus infections or maxillary pain. No ENT surgery. Chronic sores in her right nostril do bleed occasionally. She feels that if she gets bronchitis it will rapidly move to pneumonia. 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 Rainy weather and spring pollen seasons increased nasal congestion. She is living in a condo with hardwood floors and some carpet, no mold. She thinks she may feel a little worse when she is at work although this is not clear. There is no history of significant sensitivity to insects, cosmetics or foods. Past history of several pneumonias. GERD is controlled by Zantac with no history of ulcer. She denies nocturnal waking with reflux or easily strangling while eating or drinking, except she may sometimes choke if she is not careful chewing meat. No history of tuberculosis. No significant family history of respiratory problems or allergy. She is widowed with one child. She lives alone and still works for the [employer]. In the past 3 days she has had nonproductive cough sore throat some frontal headache and nasal congestion with sneezing. She thinks she is getting better on her own, but would feel safer with an antibiotic.  ROS-see HPI Constitutional:   No-   weight loss, night sweats, fevers, chills, fatigue, lassitude. HEENT:   No-  headaches, difficulty swallowing, tooth/dental problems, sore throat,       +  sneezing, itching, ear ache, nasal congestion, post nasal drip,  CV:  No-    chest pain, orthopnea, PND, swelling in lower extremities, anasarca, dizziness, palpitations Resp: No-   shortness of breath with exertion or at rest.              No-   productive cough,  +recent non-productive cough,  No- coughing up of blood.              No-   change in color of mucus.  No- wheezing.   Skin: No-   rash or lesions. GI:  No-   heartburn, indigestion, abdominal pain, nausea, vomiting, diarrhea,                 change in bowel habits, loss of appetite GU:  MS:  No-   joint pain or swelling.  No- decreased range of motion.  No- back pain. Neuro-     nothing unusual Psych:  No- change in mood or affect. No depression or anxiety.  No memory loss.  OBJ General- Alert, Oriented, Affect-appropriate, Distress- none acute, petite, appears younger than stated age. Skin- rash-none, lesions- none, excoriation- none Lymphadenopathy- none Head- atraumatic            Eyes- Gross vision intact, PERRLA, conjunctivae clear secretions            Ears- Hearing, canals-normal            Nose- mucus bridging on right, Mild external nasal deviation, , polyps, erosion, perforation             Throat- Mallampati II , mucosa clear , drainage- none, tonsils- atrophic, mild hoarseness  Neck- flexible , trachea midline, no stridor , thyroid nl, carotid no bruit Chest - symmetrical excursion , unlabored           Heart/CV- RRR , no murmur , no gallop  , no rub, nl s1 s2                           - JVD- none , edema- none, stasis changes- none, varices- none           Lung- trace rhonchi, unlabored, wheeze- none, frequent loose cough , dullness-none, rub- none           Chest wall-  Abd- tender-no, distended-no, bowel sounds-present, HSM- no Br/ Gen/ Rectal- Not done, not indicated Extrem- cyanosis- none, clubbing, none, atrophy- none, strength- nl Neuro- grossly intact to observation

## 2011-11-08 NOTE — Patient Instructions (Addendum)
Order- lab- Allergy Profile- dx allergic rhinitis  Sample Veramyst --- 2 sprays each nostril once every day at bed time till sample is used up  Keep your appointment with Dr Meredeth Ide

## 2011-11-09 LAB — ALLERGY FULL PROFILE
Alternaria Alternata: <0.1 kU/L (ref <0.35)
Aspergillus fumigatus, IgG: 0.72 kU/L — ABNORMAL HIGH (ref <0.35)
Bahia Grass: <0.1 kU/L (ref <0.35)
Bermuda Grass: <0.1 kU/L (ref <0.35)
Candida Albicans: <0.1 kU/L (ref <0.35)
Cat Dander: <0.1 kU/L (ref <0.35)
D. farinae: <0.1 kU/L (ref <0.35)
Dog Dander: <0.1 kU/L (ref <0.35)
Elm IgE: <0.1 kU/L (ref <0.35)
G009 Red Top: <0.1 kU/L (ref <0.35)
Lamb's Quarters: <0.1 kU/L (ref <0.35)
Plantain: <0.1 kU/L (ref <0.35)
Sycamore Tree: <0.1 kU/L (ref <0.35)

## 2011-11-20 NOTE — Progress Notes (Signed)
Quick Note:  LMTCB ______ 

## 2011-11-28 ENCOUNTER — Telehealth: Payer: Self-pay | Admitting: Internal Medicine

## 2011-11-28 NOTE — Telephone Encounter (Signed)
Suggest an antihistamine decongestant combination - Dayquil during the daytime, or Nyquil at night, or something from the pharmacy counter like claritin-D.  If she feels she needs an antibiotic, then offer Z pak

## 2011-11-28 NOTE — Telephone Encounter (Signed)
Called and spoke with pt and gave CY recs--pt stated that she has allegra at home and will try this first.  She will call back if anything further needed.

## 2011-11-28 NOTE — Telephone Encounter (Signed)
I spoke with pt and she c/o runny nose, hoarseness, PND, nasal congestion x 2 weeks getting worse. DENIES any cough, wheezing, chest tightness, fever, chills, sweats. Pt is using Flonase. Pt is requesting further recs from [facility]. Please advise Dr. Maple Hudson, thanks  Allergies  Allergen Reactions  . Codeine     REACTION: nausea and vomiting  . Penicillins     REACTION: breaks out  . Sulfonamide Derivatives

## 2011-12-13 ENCOUNTER — Ambulatory Visit (INDEPENDENT_AMBULATORY_CARE_PROVIDER_SITE_OTHER): Payer: BC Managed Care – PPO | Admitting: Internal Medicine

## 2011-12-13 ENCOUNTER — Encounter: Payer: Self-pay | Admitting: Internal Medicine

## 2011-12-13 VITALS — BP 110/66 | HR 86 | Ht 58.5 in | Wt 91.2 lb

## 2011-12-13 DIAGNOSIS — R490 Dysphonia: Secondary | ICD-10-CM

## 2011-12-13 DIAGNOSIS — J309 Allergic rhinitis, unspecified: Secondary | ICD-10-CM

## 2011-12-13 DIAGNOSIS — J302 Other seasonal allergic rhinitis: Secondary | ICD-10-CM

## 2011-12-13 NOTE — Progress Notes (Signed)
11/08/11- 78 yoF never smoker referred courtesy of Dr Milinda Antis for allergy evaluation. She is followed by Dr. Glee Arvin in [place] at 6 month intervals for chronic bronchitis syndrome. In 1988 her right lower lobe was resected at [facility] for what sounds like an infected sequestration. She complains of perennial nasal congestion especially on the right. She frequently has to blow mucus from the right side and feels it draining. No significant history of sinus infections or maxillary pain. No ENT surgery. Chronic sores in her right nostril do bleed occasionally. She feels that if she gets bronchitis it will rapidly move to pneumonia. 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 Rainy weather and spring pollen seasons increased nasal congestion. She is living in a condo with hardwood floors and some carpet, no mold. She thinks she may feel a little worse when she is at work although this is not clear. There is no history of significant sensitivity to insects, cosmetics or foods. Past history of several pneumonias. GERD is controlled by Zantac with no history of ulcer. She denies nocturnal waking with reflux or easily strangling while eating or drinking, except she may sometimes choke if she is not careful chewing meat. No history of tuberculosis. No significant family history of respiratory problems or allergy. She is widowed with one child. She lives alone and still works for the [employer]. In the past 3 days she has had nonproductive cough sore throat, some frontal headache and nasal congestion with sneezing. She thinks she is getting better on her own, but would feel safer with an antibiotic.  12/13/11- 78 yo never smoker followed for allergic rhinitis, followed in [place] by Dr Fleming/ Pulmonary for bronchitis, hx RLLlobectomy/ bronchiectasis She returns for followup still complaining of occasional sores in her nostrils, rhinorrhea particularly with white  discharge from the right side, and persistent but intermittent hoarseness. On routine followup with her pulmonologist in [place] she had a chest x-ray with no change reported. She denies reflux as she continues Zantac. Using Vicks in her nose. CBC- 09/21/11- WBC 5,900, EOS 1.6% Allergy Profile 11/08/11- Total IgE only 18.1, All specific agents were normal except mild elevation for Aspergillus  We placed intradermal skin tests for histamine and diluent controls , Aspergillus and GERD dust antigens. These were all non-reactive. She had taken Nyquil the evening before, which likely suppressed the response to histamine, rendering test invalid.  ROS-see HPI Constitutional:   No-   weight loss, night sweats, fevers, chills, fatigue, lassitude. HEENT:   No-  headaches, difficulty swallowing, tooth/dental problems, sore throat,       +  sneezing, itching, ear ache, nasal congestion, post nasal drip,  CV:  No-   chest pain, orthopnea, PND, swelling in lower extremities, anasarca, dizziness, palpitations Resp: No-   shortness of breath with exertion or at rest.              No-   productive cough,  +recent non-productive cough,  No- coughing up of blood.              No-   change in color of mucus.  No- wheezing.   Skin: No-   rash or lesions. GI:  No-   heartburn, indigestion, abdominal pain, nausea, vomiting, diarrhea,                 change in bowel habits, loss of appetite GU:  MS:  No-   joint pain or swelling.  No-  decreased range of motion.  No- back pain. Neuro-     nothing unusual Psych:  No- change in mood or affect. No depression or anxiety.  No memory loss.  OBJ General- Alert, Oriented, Affect-appropriate, Distress- none acute, petite, appears younger than stated age. Skin- rash-none, lesions- none, excoriation- none Lymphadenopathy- none Head- atraumatic            Eyes- Gross vision intact, PERRLA, conjunctivae clear secretions            Ears- Hearing, canals-normal             Nose- mucus bridging on right, Mild external nasal deviation, , polyps, erosion, perforation             Throat- Mallampati II , mucosa clear , drainage- none, tonsils- atrophic, mild hoarseness Neck- flexible , trachea midline, no stridor , thyroid nl, carotid no bruit Chest - symmetrical excursion , unlabored           Heart/CV- RRR , no murmur , no gallop  , no rub, nl s1 s2                           - JVD- none , edema- none, stasis changes- none, varices- none           Lung- trace rhonchi, unlabored, wheeze- none, frequent loose cough , dullness-none, rub- none           Chest wall-  Abd- Br/ Gen/ Rectal- Not done, not indicated Extrem- cyanosis- none, clubbing, none, atrophy- none, strength- nl Neuro- grossly intact to observation

## 2011-12-13 NOTE — Patient Instructions (Addendum)
Skin test today - intradermal-   Control, Greer dust,  Aspergillus- done   Order- refer ENT Dr Hermelinda Medicus - hoarseness

## 2011-12-13 NOTE — Assessment & Plan Note (Signed)
Antihistamine effect of NyQuil apparently lingered long enough to block her attempted skin testing today. I don't believe she has an allergic fungal sinusitis or ABPA, but we will repeat the skin test at some point in the future. She does have a persistent rhinitis with mucus visible on the right. I don't know if this relates to her chronic hoarseness, which may also be caused by age related weakness/bowing of a vocal cord, or some degree of unrecognized reflux. She says the hoarseness comes and goes, making a nodule less likely.. Since hoarseness is her primary complaint, I would like to arrange ENT evaluation, anticipating laryngoscopy. CT scan of the sinuses might also be considered.

## 2012-01-17 ENCOUNTER — Telehealth: Payer: Self-pay | Admitting: *Deleted

## 2012-01-17 NOTE — Telephone Encounter (Signed)
Chart opened in error

## 2012-01-19 ENCOUNTER — Ambulatory Visit (INDEPENDENT_AMBULATORY_CARE_PROVIDER_SITE_OTHER): Payer: BC Managed Care – PPO | Admitting: Family Medicine

## 2012-01-19 ENCOUNTER — Encounter: Payer: Self-pay | Admitting: Family Medicine

## 2012-01-19 VITALS — BP 138/80 | HR 84 | Temp 98.1°F | Ht 58.5 in | Wt 94.0 lb

## 2012-01-19 DIAGNOSIS — Z1231 Encounter for screening mammogram for malignant neoplasm of breast: Secondary | ICD-10-CM

## 2012-01-19 DIAGNOSIS — N390 Urinary tract infection, site not specified: Secondary | ICD-10-CM | POA: Insufficient documentation

## 2012-01-19 DIAGNOSIS — R350 Frequency of micturition: Secondary | ICD-10-CM

## 2012-01-19 LAB — POCT URINALYSIS DIPSTICK
Bilirubin, UA: NEGATIVE
Ketones, UA: NEGATIVE
Spec Grav, UA: 1.01
pH, UA: 8

## 2012-01-19 MED ORDER — CIPROFLOXACIN HCL 250 MG PO TABS: 250.0000 mg | ORAL_TABLET | Freq: Two times a day (BID) | ORAL | Status: AC

## 2012-01-19 NOTE — Patient Instructions (Signed)
Drink lots of water  Take the cipro as directed - start it as soon as you get it  If worse or fever or back pain - please call Otherwise update if not improved by next week

## 2012-01-19 NOTE — Progress Notes (Signed)
Subjective:    Patient ID: Beth Roberts, female    DOB: [birth year], 78 y.o.   MRN: [id number]  HPI Has had symptoms of cystitis for over a week  Starting with tingling at end of urination - so she drank water and also some baking soda for burning Then dysuria  Frequency and urgency No blood   No fever or nausea   Back hurts now and then - worse on L   Patient Active Problem List  Diagnoses  . HYPOGLYCEMIA, UNSPECIFIED  . HYPERLIPIDEMIA  . HYPERTENSION  . Seasonal and perennial allergic rhinitis  . COPD  . GERD  . OSTEOPOROSIS  . EPISTAXIS  . Other screening mammogram  . Varicose veins  . UTI (lower urinary tract infection)   Past Medical History  Diagnosis Date  . GERD (gastroesophageal reflux disease)     hiatal henia  . Hyperlipidemia   . Hypertension   . Osteoporosis   . Allergy     allergic rhinitis  . 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 Appendectomy   . Eye surgery     cataract extraction  . Abdominal hysterectomy     partial ; bleeding  . Lung removal, partial 1988    Lung -wedge resection/s/p sequestration (Has scar tissue on cxr now)  . Shoulder surgery 2000-2001    Bilaterial  . Foot ganglion excision   . Fracture surgery 2008    left shoulder   . Fracture left shoulder 2008  . Cystoscopy 1993    bladder diverticulum  . Ct lung screening 12/2004    right basilor ? mass// Lung CT stable 05/2005   History  Substance Use Topics  . Smoking status: Never Smoker   . Smokeless tobacco: Not on file  . Alcohol Use: No   Family History  Problem Relation Age of Onset  . Cancer Mother     breast  . Hypertension Mother   . Stroke Mother   . Heart disease Father     MI  . Hyperlipidemia Brother   . Hypertension Brother    Allergies  Allergen Reactions  . Codeine     REACTION: nausea and vomiting  . Penicillins    REACTION: breaks out  . Sulfonamide Derivatives    Current Outpatient Prescriptions on File Prior to Visit  Medication Sig Dispense Refill  . Ascorbic Acid (VITAMIN C) 500 MG tablet Take 500 mg by mouth Nightly.       . Calcium Carb-Cholecalciferol [phone] MG-UNIT CAPS Take 1 capsule by mouth daily.        . Cholecalciferol (VITAMIN D) 2000 UNITS tablet Take 2,000 Units by mouth daily.        . cloNIDine (CATAPRES) 0.1 MG tablet TAKE ONE TABLET EACH AM AND ONE TABLET AT LUNCH  60 tablet  11  . ezetimibe-simvastatin (VYTORIN) 10-20 MG per tablet Take 1 tablet by mouth at bedtime.  30 tablet  11  . fish oil-omega-3 fatty acids 1000 MG capsule Take 1 g by mouth daily.       . fluticasone (FLONASE) 50 MCG/ACT nasal spray Place 2 sprays into the nose daily.  16 g  2  . hydrochlorothiazide (HYDRODIURIL) 25 MG tablet Take 0.5 tablets (12.5 mg total) by mouth daily as needed.  30 tablet  11  . Multiple Vitamins-Minerals (WOMENS MULTIVITAMIN PLUS) TABS Take 1 tablet by mouth  daily.        . Potassium 99 MG TABS Take 1 tablet by mouth daily.        . ramipril (ALTACE) 10 MG capsule Take 1 capsule (10 mg total) by mouth daily.  30 capsule  11  . ranitidine (ZANTAC) 150 MG tablet Take 150 mg by mouth daily.        . vitamin B-12 (CYANOCOBALAMIN) 1000 MCG tablet Take 1,000 mcg by mouth daily. Take one by mouth at bedtime          Review of Systems Review of Systems  Constitutional: Negative for fever, appetite change, fatigue and unexpected weight change.  Eyes: Negative for pain and visual disturbance.  Respiratory: Negative for cough and shortness of breath.   Cardiovascular: Negative for cp or palpitations    Gastrointestinal: Negative for nausea, diarrhea and constipation.  Genitourinary: pos  for urgency and frequency. , neg for hematuria or fever  Skin: Negative for pallor or rash   Neurological: Negative for weakness, light-headedness, numbness and headaches.  Hematological: Negative for  adenopathy. Does not bruise/bleed easily.  Psychiatric/Behavioral: Negative for dysphoric mood. The patient is not nervous/anxious.         Objective:   Physical Exam  Constitutional: She appears well-developed and well-nourished. No distress.  HENT:  Head: Normocephalic and atraumatic.  Mouth/Throat: Oropharynx is clear and moist.  Eyes: Conjunctivae and EOM are normal. Pupils are equal, round, and reactive to light.  Neck: Normal range of motion. Neck supple.  Cardiovascular: Normal rate and regular rhythm.   Pulmonary/Chest: Effort normal and breath sounds normal.  Abdominal: Soft. Bowel sounds are normal. She exhibits no distension and no mass. There is tenderness. There is no guarding.       Very slt suprapubic tenderness  Musculoskeletal: She exhibits no edema.  Lymphadenopathy:    She has no cervical adenopathy.  Neurological: She is alert. She has normal reflexes.  Skin: Skin is warm and dry. No rash noted. No erythema. No pallor.  Psychiatric: She has a normal mood and affect.          Assessment & Plan:

## 2012-01-21 NOTE — Assessment & Plan Note (Signed)
mammo scheduled Enc to continue self breast exams

## 2012-01-21 NOTE — Assessment & Plan Note (Signed)
Uncomplicated tx with cipro  Rev fluid intake and red flags to watch for  Update if not starting to improve in a week or if worsening

## 2012-01-30 ENCOUNTER — Telehealth: Payer: Self-pay | Admitting: *Deleted

## 2012-01-30 NOTE — Telephone Encounter (Signed)
Done in IN box 

## 2012-01-30 NOTE — Telephone Encounter (Signed)
PA faxed to [facility] and given to [place].

## 2012-01-30 NOTE — Telephone Encounter (Signed)
Received faxed form from pharmacy requesting prior authorization for Vytorin. Form from [employer] is in your in box to complete and fax back.

## 2012-01-31 NOTE — Telephone Encounter (Signed)
Correct form faxed to [employer] at [phone] and given to [facility].

## 2012-01-31 NOTE — Telephone Encounter (Signed)
[employer] sent form back and said it was the wrong form and resent the correct form which is on your shelf in the in box for your signature.

## 2012-01-31 NOTE — Telephone Encounter (Signed)
Done thanks in IN box

## 2012-02-01 NOTE — Telephone Encounter (Signed)
Letter of approval received and faxed to [facility] [phone]. Letter put on Dr Royden Purl shelf for signature and then should be sent for scanning.

## 2012-02-12 ENCOUNTER — Ambulatory Visit: Payer: BC Managed Care – PPO | Admitting: Internal Medicine

## 2012-03-04 ENCOUNTER — Ambulatory Visit: Payer: Self-pay | Admitting: Family Medicine

## 2012-03-05 ENCOUNTER — Encounter: Payer: Self-pay | Admitting: Family Medicine

## 2012-03-06 ENCOUNTER — Encounter: Payer: Self-pay | Admitting: *Deleted

## 2012-03-19 ENCOUNTER — Telehealth: Payer: Self-pay

## 2012-03-19 NOTE — Telephone Encounter (Signed)
I recommend trying zyrtec otc 10 mg at bedtime until she sees dr Maple Hudson  If they are doing allergy testing that day however they may want her to stop it for a certain amount of time before hand

## 2012-03-19 NOTE — Telephone Encounter (Signed)
Pt request med for allergies. For several months pt has had allergies and non productive cough.Pt said allergies have "settled on vocal chords"; pt is hoarse. Pt has taken allegra and claritin with no relief. Pt has no S/T, no wheezing and no fever. Pt has appt to see Dr Maple Hudson 03/29/12 but wants Dr Milinda Antis to call in med until can see Dr Maple Hudson.Pt can be reached at [phone] and uses [facility].Please advise.

## 2012-03-19 NOTE — Telephone Encounter (Signed)
Patient advised as instructed via telephone. 

## 2012-03-29 ENCOUNTER — Ambulatory Visit: Payer: BC Managed Care – PPO | Admitting: Internal Medicine

## 2012-04-05 ENCOUNTER — Ambulatory Visit (INDEPENDENT_AMBULATORY_CARE_PROVIDER_SITE_OTHER): Payer: BC Managed Care – PPO | Admitting: Family Medicine

## 2012-04-05 ENCOUNTER — Encounter: Payer: Self-pay | Admitting: Family Medicine

## 2012-04-05 VITALS — BP 120/80 | HR 74 | Temp 98.4°F | Ht 58.5 in | Wt 92.5 lb

## 2012-04-05 DIAGNOSIS — R5381 Other malaise: Secondary | ICD-10-CM

## 2012-04-05 DIAGNOSIS — R5383 Other fatigue: Secondary | ICD-10-CM | POA: Insufficient documentation

## 2012-04-05 DIAGNOSIS — K219 Gastro-esophageal reflux disease without esophagitis: Secondary | ICD-10-CM

## 2012-04-05 LAB — COMPREHENSIVE METABOLIC PANEL
ALT: 16 U/L (ref 0–35)
Alkaline Phosphatase: 59 U/L (ref 39–117)
CO2: 31 mEq/L (ref 19–32)
Creatinine, Ser: 0.6 mg/dL (ref 0.4–1.2)
GFR: 99.1 mL/min (ref >60.00)
Glucose, Bld: 86 mg/dL (ref 70–99)
Sodium: 139 mEq/L (ref 135–145)
Total Bilirubin: 0.2 mg/dL — ABNORMAL LOW (ref 0.3–1.2)
Total Protein: 6.5 g/dL (ref 6.0–8.3)

## 2012-04-05 LAB — CBC WITH DIFFERENTIAL/PLATELET
Basophils Absolute: 0 10*3/uL (ref 0.0–0.1)
HCT: 38.3 % (ref 36.0–46.0)
Hemoglobin: 12.6 g/dL (ref 12.0–15.0)
Lymphs Abs: 1.6 10*3/uL (ref 0.7–4.0)
MCV: 96.5 fl (ref 78.0–100.0)
Monocytes Absolute: 0.6 10*3/uL (ref 0.1–1.0)
Monocytes Relative: 7.9 % (ref 3.0–12.0)
Neutro Abs: 4.8 10*3/uL (ref 1.4–7.7)
Platelets: 315 10*3/uL (ref 150.0–400.0)
RDW: 13.5 % (ref 11.5–14.6)

## 2012-04-05 LAB — TSH: TSH: 1.22 u[IU]/mL (ref 0.35–5.50)

## 2012-04-05 NOTE — Patient Instructions (Signed)
I think your fatigue is from your recent illness and also the medications you were on  This should start improving soon Will also go ahead and do labs today Work on eating regular balanced meals and take your vitamins and B12   (ensure is ok also)

## 2012-04-05 NOTE — Assessment & Plan Note (Signed)
Suspect multifactorial with recent illness/ prednisone / abx  Pt is worried  Lab today incl B12

## 2012-04-05 NOTE — Progress Notes (Signed)
Subjective:    Patient ID: Beth Roberts, female    DOB: 1924/11/14, 78 y.o.   MRN: [id number]  HPI Here for fatigue Just got over a bad cold- had a check up with Dr Mayo Ao  He put her on prednisone -- made her very tired and weak   (usually makes her hyper)  Is finished with the prednisone and zpak Was 40 mg taper  Is better today - than she has been  Was very tired and her GERD was worse  Wanted to just lie around  Lost voice and that is back  Mucous and drainage are better   Does have allergies   For a while was not eating- got down to 88 lb and now back up to 92   Asked about stress she says nothing new however she is quite worried about her fatigue and becomes tearful She explains that she does not get sick often and it frightens her  Denies problem with depression or generalized anxiety however  Patient Active Problem List  Diagnoses  . HYPOGLYCEMIA, UNSPECIFIED  . HYPERLIPIDEMIA  . HYPERTENSION  . Seasonal and perennial allergic rhinitis  . COPD  . GERD  . OSTEOPOROSIS  . EPISTAXIS  . Other screening mammogram  . Varicose veins  . UTI (lower urinary tract infection)  . Fatigue   Past Medical History  Diagnosis Date  . GERD (gastroesophageal reflux disease)     hiatal henia  . Hyperlipidemia   . Hypertension   . Osteoporosis   . Allergy     allergic rhinitis  . 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 Appendectomy   . Eye surgery     cataract extraction  . Abdominal hysterectomy     partial ; bleeding  . Lung removal, partial 1988    Lung -wedge resection/s/p sequestration (Has scar tissue on cxr now)  . Shoulder surgery 2000-2001    Bilaterial  . Foot ganglion excision   . Fracture surgery 2008    left shoulder   . Fracture left shoulder 2008  . Cystoscopy 1993    bladder diverticulum  . Ct lung screening 12/2004   right basilor ? mass// Lung CT stable 05/2005   History  Substance Use Topics  . Smoking status: Never Smoker   . Smokeless tobacco: Not on file  . Alcohol Use: No   Family History  Problem Relation Age of Onset  . Cancer Mother     breast  . Hypertension Mother   . Stroke Mother   . Heart disease Father     MI  . Hyperlipidemia Brother   . Hypertension Brother    Allergies  Allergen Reactions  . Codeine     REACTION: nausea and vomiting  . Penicillins     REACTION: breaks out  . Sulfonamide Derivatives    Current Outpatient Prescriptions on File Prior to Visit  Medication Sig Dispense Refill  . Ascorbic Acid (VITAMIN C) 500 MG tablet Take 500 mg by mouth Nightly.       . Calcium Carb-Cholecalciferol [phone] MG-UNIT CAPS Take 1 capsule by mouth daily.        . Cholecalciferol (VITAMIN D) 2000 UNITS tablet Take 2,000 Units by mouth daily.        . cloNIDine (CATAPRES) 0.1 MG tablet TAKE ONE TABLET EACH AM AND ONE TABLET AT LUNCH  60 tablet  11  . ezetimibe-simvastatin (VYTORIN) 10-20 MG per tablet Take 1 tablet by mouth at bedtime.  30 tablet  11  . fish oil-omega-3 fatty acids 1000 MG capsule Take 1 g by mouth daily.       . fluticasone (FLONASE) 50 MCG/ACT nasal spray Place 2 sprays into the nose daily.  16 g  2  . hydrochlorothiazide (HYDRODIURIL) 25 MG tablet Take 0.5 tablets (12.5 mg total) by mouth daily as needed.  30 tablet  11  . Multiple Vitamins-Minerals (WOMENS MULTIVITAMIN PLUS) TABS Take 1 tablet by mouth daily.        . Potassium 99 MG TABS Take 1 tablet by mouth daily.        . ramipril (ALTACE) 10 MG capsule Take 1 capsule (10 mg total) by mouth daily.  30 capsule  11  . ranitidine (ZANTAC) 150 MG tablet Take 150 mg by mouth daily.        . vitamin B-12 (CYANOCOBALAMIN) 1000 MCG tablet Take 1,000 mcg by mouth daily. Take one by mouth at bedtime            Review of Systems Review of Systems  Constitutional: Negative for fever, appetite change,  and  unexpected weight change. pos for fatigue  Eyes: Negative for pain and visual disturbance.  Respiratory: Negative for sob or wheeze (cough has improved) Cardiovascular: Negative for cp or palpitations    Gastrointestinal: Negative for nausea, diarrhea and constipation.  Genitourinary: Negative for urgency and frequency.  Skin: Negative for pallor or rash   Neurological: Negative for weakness, light-headedness, numbness and headaches.  Hematological: Negative for adenopathy. Does not bruise/bleed easily.  Psychiatric/Behavioral: Negative for dysphoric mood. The patient is not nervous/anxious.         Objective:   Physical Exam  Constitutional: She appears well-developed and well-nourished. No distress.       Slim elderly female  HENT:  Head: Normocephalic and atraumatic.  Right Ear: External ear normal.  Left Ear: External ear normal.  Nose: Nose normal.  Mouth/Throat: Oropharynx is clear and moist.  Eyes: Conjunctivae and EOM are normal. Pupils are equal, round, and reactive to light. No scleral icterus.  Neck: Normal range of motion. Neck supple. No JVD present. Carotid bruit is not present. No thyromegaly present.  Cardiovascular: Normal rate, regular rhythm, normal heart sounds and intact distal pulses.  Exam reveals no gallop.   Pulmonary/Chest: Effort normal and breath sounds normal. No respiratory distress. She has no wheezes. She has no rales. She exhibits no tenderness.  Abdominal: Soft. Bowel sounds are normal. She exhibits no distension and no abdominal bruit. There is no tenderness.  Musculoskeletal: Normal range of motion. She exhibits no edema and no tenderness.  Lymphadenopathy:    She has no cervical adenopathy.  Neurological: She is alert. She has normal reflexes. No cranial nerve deficit. She exhibits normal muscle tone. Coordination normal.  Skin: Skin is warm and dry. No rash noted. No erythema. No pallor.       Normal color and turgor  Psychiatric: Her mood  appears anxious.       Becomes tearful when she disc her symptoms at the end of the visit           Assessment & Plan:

## 2012-04-05 NOTE — Assessment & Plan Note (Signed)
This comes and goes- worse with antibiotic - caused her to eat irratically/ takes anti acid med prn  Check B12 is at risk of malabsorbtion

## 2012-04-29 ENCOUNTER — Encounter: Payer: Self-pay | Admitting: Internal Medicine

## 2012-04-29 ENCOUNTER — Ambulatory Visit (INDEPENDENT_AMBULATORY_CARE_PROVIDER_SITE_OTHER)
Admission: RE | Admit: 2012-04-29 | Discharge: 2012-04-29 | Disposition: A | Discharge status: Home / Self Care | Payer: BC Managed Care – PPO | Source: Ambulatory Visit | Attending: Internal Medicine | Admitting: Internal Medicine

## 2012-04-29 ENCOUNTER — Ambulatory Visit (INDEPENDENT_AMBULATORY_CARE_PROVIDER_SITE_OTHER): Payer: BC Managed Care – PPO | Admitting: Internal Medicine

## 2012-04-29 VITALS — BP 108/62 | HR 91 | Ht 58.5 in | Wt 92.2 lb

## 2012-04-29 DIAGNOSIS — J309 Allergic rhinitis, unspecified: Secondary | ICD-10-CM

## 2012-04-29 DIAGNOSIS — J449 Chronic obstructive pulmonary disease, unspecified: Secondary | ICD-10-CM

## 2012-04-29 DIAGNOSIS — J4489 Other specified chronic obstructive pulmonary disease: Secondary | ICD-10-CM

## 2012-04-29 DIAGNOSIS — J302 Other seasonal allergic rhinitis: Secondary | ICD-10-CM

## 2012-04-29 NOTE — Patient Instructions (Addendum)
Order- CXR   Dx dyspnea        Need to print a script for an incentive spirometer    --- 3 times daily, blow your breath out and see how much air you can draw in. Keep working at it, trying to build up your chest muscles.

## 2012-04-29 NOTE — Progress Notes (Signed)
11/08/11- 74 yoF never smoker referred courtesy of Dr Milinda Antis for allergy evaluation. She is followed by Dr. Glee Arvin in [place] at 6 month intervals for chronic bronchitis syndrome. In 1988 her right lower lobe was resected at [facility] for what sounds like an infected sequestration. She complains of perennial nasal congestion especially on the right. She frequently has to blow mucus from the right side and feels it draining. No significant history of sinus infections or maxillary pain. No ENT surgery. Chronic sores in her right nostril do bleed occasionally. She feels that if she gets bronchitis it will rapidly move to pneumonia. 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 Rainy weather and spring pollen seasons increased nasal congestion. She is living in a condo with hardwood floors and some carpet, no mold. She thinks she may feel a little worse when she is at work although this is not clear. There is no history of significant sensitivity to insects, cosmetics or foods. Past history of several pneumonias. GERD is controlled by Zantac with no history of ulcer. She denies nocturnal waking with reflux or easily strangling while eating or drinking, except she may sometimes choke if she is not careful chewing meat. No history of tuberculosis. No significant family history of respiratory problems or allergy. She is widowed with one child. She lives alone and still works for the [employer]. In the past 3 days she has had nonproductive cough sore throat, some frontal headache and nasal congestion with sneezing. She thinks she is getting better on her own, but would feel safer with an antibiotic.  12/13/11- 62 yo never smoker followed for allergic rhinitis, followed in [place] by Dr Fleming/ Pulmonary for bronchitis, hx RLLlobectomy/ bronchiectasis She returns for followup still complaining of occasional sores in her nostrils, rhinorrhea particularly with white  discharge from the right side, and persistent but intermittent hoarseness. On routine followup with her pulmonologist in [place] she had a chest x-ray with no change reported. She denies reflux as she continues Zantac. Using Vicks in her nose. CBC- 09/21/11- WBC 5,900, EOS 1.6% Allergy Profile 11/08/11- Total IgE only 18.1, All specific agents were normal except mild elevation for Aspergillus  We placed intradermal skin tests for histamine and diluent controls , Aspergillus and GREER dust antigens. These were all non-reactive. She had taken Nyquil the evening before, which likely suppressed the response to histamine, rendering test invalid.  04/29/12- 58 yo never smoker followed for allergic rhinitis; followed in [place] by Dr Fleming/ Pulmonary for bronchitis, hx RLLlobectomy/ bronchiectasis Hoarseness; itchy, watery eyes at times; Seen by Dr Haroldine Laws who reports normal laryngoscopy 12/27/2011. She reports no change in her sense of "allergy" meaning a sensation of bladder fullness in her throat, despite season change from spring to summer and in the spring pollen season. She is bothered most by weakness of her voice which she says is not loud enough for her listeners. I asked if the problem was actually that her listeners had decreased hearing, which hadn't occurred to her.  ROS-see HPI Constitutional:   No-   weight loss, night sweats, fevers, chills, fatigue, lassitude. HEENT:   No-  headaches, difficulty swallowing, tooth/dental problems, sore throat,       No-  sneezing, itching, ear ache, nasal congestion, +post nasal drip,  CV:  No-   chest pain, orthopnea, PND, swelling in lower extremities, anasarca, dizziness, palpitations Resp: No-   shortness of breath with exertion or at rest.  No-   productive cough,  little recent non-productive cough,  No- coughing up of blood.              No-   change in color of mucus.  No- wheezing.   Skin: No-   rash or lesions. GI:  No-    heartburn, indigestion, abdominal pain, nausea, vomiting, GU:  MS:  No-   joint pain or swelling. . Neuro-     nothing unusual Psych:  No- change in mood or affect. No depression or anxiety.  No memory loss.  OBJ BP 108/62  Pulse 91  Ht 4' 10.5" (1.486 m)  Wt 92 lb 3.2 oz (41.822 kg)  BMI 18.94 kg/m2  SpO2 97%  General- Alert, Oriented, Affect-appropriate, Distress- none acute, petite, appears younger than stated age. Skin- rash-none, lesions- none, excoriation- none Lymphadenopathy- none Head- atraumatic            Eyes- Gross vision intact, PERRLA, conjunctivae clear secretions            Ears- Hearing, canals-normal            Nose- mucus bridging on right, Mild external nasal deviation, , polyps, erosion, perforation             Throat- Mallampati II , mucosa clear , drainage- none, tonsils- atrophic, voice quality maybe a little soft Neck- flexible , trachea midline, no stridor , thyroid nl, carotid no bruit Chest - symmetrical excursion , unlabored           Heart/CV- RRR , no murmur , no gallop  , no rub, nl s1 s2                           - JVD- none , edema- none, stasis changes- none, varices- none           Lung- trace rhonchi, unlabored, wheeze- none, frequent loose cough , dullness-none, rub- none           Chest wall-  Abd- Br/ Gen/ Rectal- Not done, not indicated Extrem- cyanosis- none, clubbing, none, atrophy- none, strength- nl Neuro- grossly intact to observation

## 2012-05-03 NOTE — Progress Notes (Signed)
Quick Note:  Pt aware of results. ______ 

## 2012-05-04 NOTE — Assessment & Plan Note (Signed)
We have not defined a significant allergy issue, which was her question.

## 2012-05-04 NOTE — Assessment & Plan Note (Addendum)
She is bothered most by friends who tell her that her voice is too soft to hear well. The problem may be her friends' hearing, given the age group. She is not strong and may have weakness of respiratory effort resulting in soft her voice. Laryngoscopy by Dr. Edd Fabian did not recognize anatomic abnormality in her larynx. Plan-chest x-ray, provide incentive spirometer she continues as an exercise device as instructed.

## 2012-08-01 ENCOUNTER — Ambulatory Visit (INDEPENDENT_AMBULATORY_CARE_PROVIDER_SITE_OTHER): Payer: Medicare Other

## 2012-08-01 DIAGNOSIS — Z23 Encounter for immunization: Secondary | ICD-10-CM

## 2012-08-29 ENCOUNTER — Encounter: Payer: Self-pay | Admitting: Internal Medicine

## 2012-08-29 ENCOUNTER — Ambulatory Visit (INDEPENDENT_AMBULATORY_CARE_PROVIDER_SITE_OTHER): Payer: Medicare Other | Admitting: Internal Medicine

## 2012-08-29 VITALS — BP 106/80 | HR 93 | Ht 58.5 in | Wt 91.4 lb

## 2012-08-29 DIAGNOSIS — J309 Allergic rhinitis, unspecified: Secondary | ICD-10-CM

## 2012-08-29 DIAGNOSIS — J3089 Other allergic rhinitis: Secondary | ICD-10-CM

## 2012-08-29 MED ORDER — IPRATROPIUM BROMIDE 0.06 % NA SOLN: NASAL | Status: DC

## 2012-08-29 NOTE — Patient Instructions (Addendum)
Samples Benicar HCT 20/1.5, to take one ta b, once every day for a month, instead of ramipril.   We are trying to see if the ramipril is causing the cough and the voice difficulties. After the samples are used up, go back on the ramipril and discuss with your doctor.  Script for ipratropium nasal spray to try 1-2 puffs in each nostril up to 3 times daily as needed for watery nose.

## 2012-08-29 NOTE — Progress Notes (Signed)
11/08/11- 78 yoF never smoker referred courtesy of Dr Milinda Antis for allergy evaluation. She is followed by Dr. Glee Arvin in [place] at 6 month intervals for chronic bronchitis syndrome. In 1988 her right lower lobe was resected at [facility] for what sounds like an infected sequestration. She complains of perennial nasal congestion especially on the right. She frequently has to blow mucus from the right side and feels it draining. No significant history of sinus infections or maxillary pain. No ENT surgery. Chronic sores in her right nostril do bleed occasionally. She feels that if she gets bronchitis it will rapidly move to pneumonia. 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 Rainy weather and spring pollen seasons increased nasal congestion. She is living in a condo with hardwood floors and some carpet, no mold. She thinks she may feel a little worse when she is at work although this is not clear. There is no history of significant sensitivity to insects, cosmetics or foods. Past history of several pneumonias. GERD is controlled by Zantac with no history of ulcer. She denies nocturnal waking with reflux or easily strangling while eating or drinking, except she may sometimes choke if she is not careful chewing meat. No history of tuberculosis. No significant family history of respiratory problems or allergy. She is widowed with one child. She lives alone and still works for the [employer]. In the past 3 days she has had nonproductive cough sore throat, some frontal headache and nasal congestion with sneezing. She thinks she is getting better on her own, but would feel safer with an antibiotic.  12/13/11- 78 yo never smoker followed for allergic rhinitis, followed in [place] by Dr Fleming/ Pulmonary for bronchitis, hx RLLlobectomy/ bronchiectasis She returns for followup still complaining of occasional sores in her nostrils, rhinorrhea particularly with white  discharge from the right side, and persistent but intermittent hoarseness. On routine followup with her pulmonologist in [place] she had a chest x-ray with no change reported. She denies reflux as she continues Zantac. Using Vicks in her nose. CBC- 09/21/11- WBC 5,900, EOS 1.6% Allergy Profile 11/08/11- Total IgE only 18.1, All specific agents were normal except mild elevation for Aspergillus  We placed intradermal skin tests for histamine and diluent controls , Aspergillus and GREER dust antigens. These were all non-reactive. She had taken Nyquil the evening before, which likely suppressed the response to histamine, rendering test invalid.  04/29/12- 78 yo never smoker followed for allergic rhinitis; followed in [place] by Dr Fleming/ Pulmonary for bronchitis, hx RLLlobectomy/ bronchiectasis Hoarseness; itchy, watery eyes at times; Seen by Dr Haroldine Laws who reports normal laryngoscopy 12/27/2011. She reports no change in her sense of "allergy" meaning a sensation of  fullness in her throat, despite season change from spring to summer and in the spring pollen season. She is bothered most by weakness of her voice which she says is not loud enough for her listeners. I asked if the problem was actually that her listeners had decreased hearing, which hadn't occurred to her.  08/29/12- 78 yo never smoker followed for allergic rhinitis; followed in [place] by Dr Fleming/ Pulmonary for bronchitis, hx RLLlobectomy/ bronchiectasis  "cold like feeling" cough-yellow; hoarseness all the time Still has same sense of something in her throat and complains that she can't sing in a choir. Watery nose in the mornings. No throat pain or difficulty swallowing. COPD assessment test (CABG) score 6/40 CXR 04/29/12-reviewed with her IMPRESSION:  1. Postoperative changes suggesting prior wedge resection  in the  right lung (likely right lower lobe) with a small right-sided  pleural effusion.  2. Atherosclerosis.  3. Old  healed posterolateral right 4th rib fracture.  Original Report Authenticated By: Florencia Reasons, M.D.   ROS-see HPI Constitutional:   No-   weight loss, night sweats, fevers, chills, fatigue, lassitude. HEENT:   No-  headaches, difficulty swallowing, tooth/dental problems, sore throat,       No-  sneezing, itching, ear ache, nasal congestion, +post nasal drip,  CV:  No-   chest pain, orthopnea, PND, swelling in lower extremities, anasarca, dizziness, palpitations Resp: No-   shortness of breath with exertion or at rest.              No-   productive cough,  little recent non-productive cough,  No- coughing up of blood.              No-   change in color of mucus.  No- wheezing.   Skin: No-   rash or lesions. GI:  No-   heartburn, indigestion, abdominal pain, nausea, vomiting, GU:  MS:  No-   joint pain or swelling. . Neuro-     nothing unusual Psych:  No- change in mood or affect. No depression or anxiety.  No memory loss.  OBJ BP 106/80  Pulse 93  Ht 4' 10.5" (1.486 m)  Wt 91 lb 6.4 oz (41.459 kg)  BMI 18.78 kg/m2  SpO2 97% General- Alert, Oriented, Affect-appropriate, Distress- none acute, petite, appears younger than stated age. Skin- rash-none, lesions- none, excoriation- none Lymphadenopathy- none Head- atraumatic            Eyes- Gross vision intact, PERRLA, conjunctivae clear secretions            Ears- Hearing, canals-normal            Nose- mucus bridging on right, Mild external nasal deviation, , polyps, erosion, perforation             Throat- Mallampati II , mucosa clear , drainage- none, tonsils- atrophic, voice quality seems normal Neck- flexible , trachea midline, no stridor , thyroid nl, carotid no bruit Chest - symmetrical excursion , unlabored           Heart/CV- RRR , no murmur , no gallop  , no rub, nl s1 s2                           - JVD- none , edema- none, stasis changes- none, varices- none           Lung- trace rhonchi, unlabored,  very slight wheezy  cough , dullness-none, rub- none           Chest wall-  Abd- Br/ Gen/ Rectal- Not done, not indicated Extrem- cyanosis- none, clubbing, none, atrophy- none, strength- nl Neuro- grossly intact to observation

## 2012-09-08 NOTE — Assessment & Plan Note (Signed)
At her age, allergy is less likely than nonspecific irritant rhinitis. , She focuses on her voice quality, I am having her stop her ACE inhibitor ramipril for awhile to see if it is affecting voice quality. She could easily have some bowing or laxity of the vocal cord, vocal cord nodules or some other local pathology best assessed by ENT. Plan-try ipratropium nasal spray.

## 2012-09-10 ENCOUNTER — Other Ambulatory Visit: Payer: Self-pay | Admitting: *Deleted

## 2012-09-10 ENCOUNTER — Telehealth: Payer: Self-pay

## 2012-09-10 MED ORDER — CLONIDINE HCL 0.1 MG PO TABS: ORAL_TABLET | ORAL | Status: DC

## 2012-09-10 NOTE — Telephone Encounter (Signed)
Pt wanted to ck status of clonidine refill. Pt notified by v/m ck with [facility] for refill.

## 2012-09-25 ENCOUNTER — Encounter: Payer: Self-pay | Admitting: Family Medicine

## 2012-09-25 ENCOUNTER — Ambulatory Visit (INDEPENDENT_AMBULATORY_CARE_PROVIDER_SITE_OTHER): Payer: Medicare Other | Admitting: Family Medicine

## 2012-09-25 VITALS — BP 130/74 | HR 90 | Temp 97.9°F | Ht 58.5 in | Wt 93.8 lb

## 2012-09-25 DIAGNOSIS — K219 Gastro-esophageal reflux disease without esophagitis: Secondary | ICD-10-CM

## 2012-09-25 DIAGNOSIS — R490 Dysphonia: Secondary | ICD-10-CM | POA: Insufficient documentation

## 2012-09-25 DIAGNOSIS — I10 Essential (primary) hypertension: Secondary | ICD-10-CM

## 2012-09-25 MED ORDER — RANITIDINE HCL 150 MG PO TABS: 150.0000 mg | ORAL_TABLET | Freq: Two times a day (BID) | ORAL | Status: DC

## 2012-09-25 NOTE — Assessment & Plan Note (Signed)
Has had pulm/ all/ ENT work up  Better off ace Will inc gerd tx - to bid and see if that imp also  This may end up being baseline for her Rev notes and last cxr

## 2012-09-25 NOTE — Assessment & Plan Note (Signed)
bp in fair control at this time  No changes needed - is doing fine off ace and does not need ARB either- good news  Voice is improved somewhat Disc lifstyle change with low sodium diet and exercise

## 2012-09-25 NOTE — Assessment & Plan Note (Signed)
I worry that some silent gerd could also worsen hoarseness- so inc zantac 150 to bid Will update if not helpful  Good diet

## 2012-09-25 NOTE — Patient Instructions (Addendum)
Stay off the altace/ ramipril  Also stay off the benicar Your blood pressure seems fine off of either of these - and your voice has improved a bit  Do increase your zantac 150 to twice daily- just in case acid reflux affects your voice- this will help (I sent a new px to your pharmacy) Keep taking care of yourself  Schedule annual exam with labs prior in may

## 2012-09-25 NOTE — Progress Notes (Signed)
Subjective:    Patient ID: Beth Roberts, female    DOB: July 04, 1925, 78 y.o.   MRN: [id number]  HPI Is doing well overall  She saw pulmonary- and he is treating for allergies and other things  Dr Maple Hudson She saw the ENT doctor as well - for investigation of vocal cords  Was told that her ace / altace could possibly affect her hoarseness  Has a hard time controlling her bp in the past  She stopped it in oct and started benicar 20-12.5 - this made her very weak - bp got too low 88/66   Since she stopped it - with no substitution her bp has been ok  bp is stable today  No cp or palpitations or headaches or edema  No side effects to medicines  BP Readings from Last 3 Encounters:  09/25/12 130/74  08/29/12 106/80  04/29/12 108/62     Thinks her voice is just a little bit improved - without the ace   She takes zantac 150 mg for gerd once daily  No heartburn symptoms at all  Does clear throat frequently  Patient Active Problem List  Diagnosis  . HYPOGLYCEMIA, UNSPECIFIED  . HYPERLIPIDEMIA  . HYPERTENSION  . Seasonal and perennial allergic rhinitis  . COPD  . GERD  . OSTEOPOROSIS  . EPISTAXIS  . Other screening mammogram  . Varicose veins  . UTI (lower urinary tract infection)  . Fatigue   Past Medical History  Diagnosis Date  . GERD (gastroesophageal reflux disease)     hiatal henia  . Hyperlipidemia   . Hypertension   . Osteoporosis   . Allergy     allergic rhinitis  . 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 Appendectomy   . Eye surgery     cataract extraction  . Abdominal hysterectomy     partial ; bleeding  . Lung removal, partial 1988    Lung -wedge resection/s/p sequestration (Has scar tissue on cxr now)  . Shoulder surgery 2000-2001    Bilaterial  . Foot ganglion excision   . Fracture surgery 2008    left shoulder   . Fracture left  shoulder 2008  . Cystoscopy 1993    bladder diverticulum  . Ct lung screening 12/2004    right basilor ? mass// Lung CT stable 05/2005   History  Substance Use Topics  . Smoking status: Never Smoker   . Smokeless tobacco: Not on file  . Alcohol Use: No   Family History  Problem Relation Age of Onset  . Cancer Mother     breast  . Hypertension Mother   . Stroke Mother   . Heart disease Father     MI  . Hyperlipidemia Brother   . Hypertension Brother    Allergies  Allergen Reactions  . Codeine     REACTION: nausea and vomiting  . Penicillins     REACTION: breaks out  . Sulfonamide Derivatives    Current Outpatient Prescriptions on File Prior to Visit  Medication Sig Dispense Refill  . Ascorbic Acid (VITAMIN C) 500 MG tablet Take 500 mg by mouth Nightly.       . Calcium Carb-Cholecalciferol [phone] MG-UNIT CAPS Take 1 capsule by mouth daily.        . cetirizine (ZYRTEC) 10 MG tablet Take 10 mg by mouth daily.      Marland Kitchen  Cholecalciferol (VITAMIN D) 2000 UNITS tablet Take 2,000 Units by mouth daily.        . cloNIDine (CATAPRES) 0.1 MG tablet TAKE ONE TABLET EACH AM AND ONE TABLET AT LUNCH  60 tablet  2  . ezetimibe-simvastatin (VYTORIN) 10-20 MG per tablet Take 1 tablet by mouth at bedtime.  30 tablet  11  . fish oil-omega-3 fatty acids 1000 MG capsule Take 1 g by mouth daily.       . fluticasone (FLONASE) 50 MCG/ACT nasal spray Place 2 sprays into the nose daily.  16 g  2  . hydrochlorothiazide (HYDRODIURIL) 25 MG tablet Take 0.5 tablets (12.5 mg total) by mouth daily as needed.  30 tablet  11  . ipratropium (ATROVENT) 0.06 % nasal spray 1-2 puffs each nostril up to 3 times daily as needed for watery nose  15 mL  12  . Multiple Vitamins-Minerals (WOMENS MULTIVITAMIN PLUS) TABS Take 1 tablet by mouth daily.        . Potassium 99 MG TABS Take 1 tablet by mouth daily.        . ramipril (ALTACE) 10 MG capsule Take 1 capsule (10 mg total) by mouth daily.  30 capsule  11  .  ranitidine (ZANTAC) 150 MG tablet Take 150 mg by mouth daily.        . sennosides-docusate sodium (SENOKOT-S) 8.6-50 MG tablet Take 1 tablet by mouth daily.      . vitamin B-12 (CYANOCOBALAMIN) 1000 MCG tablet Take 1,000 mcg by mouth daily. Take one by mouth at bedtime              Review of Systems Review of Systems  Constitutional: Negative for fever, appetite change, fatigue and unexpected weight change.  Eyes: Negative for pain and visual disturbance.  ENT pos for chronic hoarseness that has improved Respiratory: Negative for cough and shortness of breath.   Cardiovascular: Negative for cp or palpitations    Gastrointestinal: Negative for nausea, diarrhea and constipation.  Genitourinary: Negative for urgency and frequency.  Skin: Negative for pallor or rash   Neurological: Negative for weakness, light-headedness, numbness and headaches.  Hematological: Negative for adenopathy. Does not bruise/bleed easily.  Psychiatric/Behavioral: Negative for dysphoric mood. The patient is not nervous/anxious.         Objective:   Physical Exam  Constitutional: She appears well-developed and well-nourished. No distress.  HENT:  Head: Normocephalic and atraumatic.  Mouth/Throat: Oropharynx is clear and moist.  Eyes: Conjunctivae normal and EOM are normal. Pupils are equal, round, and reactive to light. No scleral icterus.  Neck: Normal range of motion. Neck supple. Carotid bruit is not present. No tracheal deviation present. No thyromegaly present.  Cardiovascular: Normal rate, regular rhythm, normal heart sounds and intact distal pulses.  Exam reveals no gallop.   Pulmonary/Chest: Effort normal and breath sounds normal. No respiratory distress. She has no wheezes.  Abdominal: Soft. Bowel sounds are normal. She exhibits no distension and no abdominal bruit. There is no tenderness.  Musculoskeletal: She exhibits no edema.  Lymphadenopathy:    She has no cervical adenopathy.  Neurological:  She is alert. She has normal reflexes. No cranial nerve deficit. She exhibits normal muscle tone. Coordination normal.  Skin: Skin is warm and dry. No rash noted. No erythema. No pallor.  Psychiatric: She has a normal mood and affect.          Assessment & Plan:

## 2012-11-18 ENCOUNTER — Telehealth: Payer: Self-pay | Admitting: Family Medicine

## 2012-11-18 NOTE — Telephone Encounter (Signed)
I have to have a visit to discuss symptoms and formally diagnose her before we treat that condition

## 2012-11-18 NOTE — Telephone Encounter (Signed)
Pt notified and appt scheduled for 11/26/12

## 2012-11-18 NOTE — Telephone Encounter (Signed)
Pt is out of town in [place] and requesting that she get RX for restless leg syndrome, which she has not been diagnosed with, but states that she knows "that's what it is" and is unable to sleep as a result.  Please advise.

## 2012-11-25 ENCOUNTER — Encounter: Payer: Self-pay | Admitting: Family Medicine

## 2012-11-25 ENCOUNTER — Ambulatory Visit (INDEPENDENT_AMBULATORY_CARE_PROVIDER_SITE_OTHER): Payer: Medicare Other | Admitting: Family Medicine

## 2012-11-25 VITALS — BP 132/68 | HR 92 | Temp 97.7°F | Ht 58.5 in | Wt 90.2 lb

## 2012-11-25 DIAGNOSIS — IMO0001 Reserved for inherently not codable concepts without codable children: Secondary | ICD-10-CM | POA: Insufficient documentation

## 2012-11-25 DIAGNOSIS — G2581 Restless legs syndrome: Secondary | ICD-10-CM

## 2012-11-25 DIAGNOSIS — R569 Unspecified convulsions: Secondary | ICD-10-CM

## 2012-11-25 LAB — CBC WITH DIFFERENTIAL/PLATELET
Basophils Relative: 0.5 % (ref 0.0–3.0)
Eosinophils Absolute: 0.1 10*3/uL (ref 0.0–0.7)
MCHC: 33.6 g/dL (ref 30.0–36.0)
MCV: 95.2 fl (ref 78.0–100.0)
Monocytes Absolute: 0.5 10*3/uL (ref 0.1–1.0)
Neutrophils Relative %: 65.4 % (ref 43.0–77.0)
RBC: 4.03 Mil/uL (ref 3.87–5.11)

## 2012-11-25 MED ORDER — ROPINIROLE HCL 0.5 MG PO TABS: 1.0000 mg | ORAL_TABLET | Freq: Every day | ORAL | Status: DC

## 2012-11-25 NOTE — Progress Notes (Signed)
Subjective:    Patient ID: Beth Roberts, female    DOB: 1925-11-05, 79 y.o.   MRN: [id number]  HPI Here with restless leg symptoms  Has never had trouble sleeping until now -for a couple of months  Wakes up with legs bothering her  They "jerk" - and has the irresistible urge to move them  Very seldom gets leg cramps -once recently  Never had this before  She tried melatonin to help her sleep  Will finally fall asleep at 5 am  This really bothers her   She takes zantac for hiatal hernia  Has been walking the dog    Retired in July - she would rather be working   Her daughter worries - because she stares off into space for peroids of time and difficult to get her attention ? If these episodes could be related to stroke or seizure  She generally does not remember these episodes No residual symptoms at all  Memory and cognition are quite good    Patient Active Problem List  Diagnosis  . HYPOGLYCEMIA, UNSPECIFIED  . HYPERLIPIDEMIA  . HYPERTENSION  . Seasonal and perennial allergic rhinitis  . COPD  . GERD  . OSTEOPOROSIS  . EPISTAXIS  . Other screening mammogram  . Varicose veins  . UTI (lower urinary tract infection)  . Fatigue  . Hoarseness, chronic   Past Medical History  Diagnosis Date  . GERD (gastroesophageal reflux disease)     hiatal henia  . Hyperlipidemia   . Hypertension   . Osteoporosis   . Allergy     allergic rhinitis  . 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 Appendectomy   . Eye surgery     cataract extraction  . Abdominal hysterectomy     partial ; bleeding  . Lung removal, partial 1988    Lung -wedge resection/s/p sequestration (Has scar tissue on cxr now)  . Shoulder surgery 2000-2001    Bilaterial  . Foot ganglion excision   . Fracture surgery 2008    left shoulder   . Fracture left shoulder 2008  . Cystoscopy  1993    bladder diverticulum  . Ct lung screening 12/2004    right basilor ? mass// Lung CT stable 05/2005   History  Substance Use Topics  . Smoking status: Never Smoker   . Smokeless tobacco: Not on file  . Alcohol Use: No   Family History  Problem Relation Age of Onset  . Cancer Mother     breast  . Hypertension Mother   . Stroke Mother   . Heart disease Father     MI  . Hyperlipidemia Brother   . Hypertension Brother    Allergies  Allergen Reactions  . Codeine     REACTION: nausea and vomiting  . Penicillins     REACTION: breaks out  . Sulfonamide Derivatives    Current Outpatient Prescriptions on File Prior to Visit  Medication Sig Dispense Refill  . Ascorbic Acid (VITAMIN C) 500 MG tablet Take 500 mg by mouth Nightly.       . Calcium Carb-Cholecalciferol [phone] MG-UNIT CAPS Take 1 capsule by mouth daily.        . cetirizine (ZYRTEC) 10 MG tablet Take 10 mg by mouth daily.      . Cholecalciferol (VITAMIN D) 2000 UNITS tablet Take 2,000 Units by mouth daily.        Marland Kitchen  cloNIDine (CATAPRES) 0.1 MG tablet TAKE ONE TABLET EACH AM AND ONE TABLET AT LUNCH  60 tablet  2  . ezetimibe-simvastatin (VYTORIN) 10-20 MG per tablet Take 1 tablet by mouth at bedtime.  30 tablet  11  . fish oil-omega-3 fatty acids 1000 MG capsule Take 1 g by mouth daily.       . fluticasone (FLONASE) 50 MCG/ACT nasal spray Place 2 sprays into the nose daily.  16 g  2  . hydrochlorothiazide (HYDRODIURIL) 25 MG tablet Take 0.5 tablets (12.5 mg total) by mouth daily as needed.  30 tablet  11  . ipratropium (ATROVENT) 0.06 % nasal spray 1-2 puffs each nostril up to 3 times daily as needed for watery nose  15 mL  12  . Multiple Vitamins-Minerals (WOMENS MULTIVITAMIN PLUS) TABS Take 1 tablet by mouth daily.        . Potassium 99 MG TABS Take 1 tablet by mouth daily.        . ranitidine (ZANTAC) 150 MG tablet Take 1 tablet (150 mg total) by mouth 2 (two) times daily.  60 tablet  11  . sennosides-docusate  sodium (SENOKOT-S) 8.6-50 MG tablet Take 1 tablet by mouth daily.      . vitamin B-12 (CYANOCOBALAMIN) 1000 MCG tablet Take 1,000 mcg by mouth daily. Take one by mouth at bedtime              Lab Results  Component Value Date   WBC 7.1 04/05/2012   HGB 12.6 04/05/2012   HCT 38.3 04/05/2012   MCV 96.5 04/05/2012   PLT 315.0 04/05/2012      Review of Systems Review of Systems  Constitutional: Negative for fever, appetite changeand unexpected weight change. pos for fatigue from lack of sleep Eyes: Negative for pain and visual disturbance.  Respiratory: Negative for cough and shortness of breath.   Cardiovascular: Negative for cp or palpitations    Gastrointestinal: Negative for nausea, diarrhea and constipation.  Genitourinary: Negative for urgency and frequency.  Skin: Negative for pallor or rash   Neurological: Negative for weakness, light-headedness, numbness and headaches. pos for restless legs  Hematological: Negative for adenopathy. Does not bruise/bleed easily.  Psychiatric/Behavioral: Negative for dysphoric mood. The patient is  nervous/anxious.         Objective:   Physical Exam  Constitutional: She is oriented to person, place, and time. She appears well-developed and well-nourished. No distress.  HENT:  Head: Normocephalic and atraumatic.  Mouth/Throat: Oropharynx is clear and moist.  Eyes: Conjunctivae normal and EOM are normal. Pupils are equal, round, and reactive to light. Right eye exhibits no discharge. Left eye exhibits no discharge. No scleral icterus.  Neck: Normal range of motion. Neck supple. No JVD present. Carotid bruit is not present. No thyromegaly present.  Cardiovascular: Normal rate, regular rhythm, normal heart sounds and intact distal pulses.  Exam reveals no gallop.   Pulmonary/Chest: Effort normal and breath sounds normal. No respiratory distress. She has no wheezes.  Abdominal: Soft. Bowel sounds are normal. She exhibits no distension, no abdominal  bruit and no mass. There is no tenderness.  Musculoskeletal: She exhibits no edema and no tenderness.  Lymphadenopathy:    She has no cervical adenopathy.  Neurological: She is alert and oriented to person, place, and time. She has normal reflexes. She displays no atrophy and no tremor. No cranial nerve deficit or sensory deficit. She exhibits normal muscle tone. Coordination and gait normal.       No focal cerebellar  signs  Nl gait No cogwheel rigidity  Skin: Skin is warm and dry. No rash noted. No erythema. No pallor.  Psychiatric: Her behavior is normal. Judgment and thought content normal. Her mood appears not anxious. Her affect is not blunt and not labile. Cognition and memory are normal. She does not exhibit a depressed mood.          Assessment & Plan:

## 2012-11-25 NOTE — Assessment & Plan Note (Signed)
Pt is having non aware brief episodes of staring without LOC but unresponsive ? Is poss focal/ petit mal seizures Reassuring exam Did refer to neurol for further eval

## 2012-11-25 NOTE — Patient Instructions (Addendum)
For requip: Start with 1/2 pill at bedtime for 1 week Then increase to 1 pill at bedtime for 1 week Then increase to 1 1/2 pills at bedtime for 1 week Then increase to 2 pills at bedtime for 1 week  Stop at the dose that helps  If side effects or problems let me know   Lab today for anemia   We will do a referral to neurologist at check out for the staring spells   Take care of yourself and do not forget to eat regular meals

## 2012-11-25 NOTE — Assessment & Plan Note (Signed)
This is impairing sleep and a new symptom Reassuring exam  Will try requip- titrating up slowly from 1/2 of a .5 mg to 1 mg over 2-4 weeks  Disc poss side eff Will update

## 2012-11-26 ENCOUNTER — Encounter: Payer: Self-pay | Admitting: *Deleted

## 2012-11-26 ENCOUNTER — Ambulatory Visit: Payer: Medicare Other | Admitting: Family Medicine

## 2012-12-06 ENCOUNTER — Encounter: Payer: Self-pay | Admitting: Neurology

## 2012-12-06 ENCOUNTER — Ambulatory Visit (INDEPENDENT_AMBULATORY_CARE_PROVIDER_SITE_OTHER): Payer: Medicare Other | Admitting: Neurology

## 2012-12-06 VITALS — BP 146/84 | HR 84 | Temp 97.5°F | Resp 18 | Wt 91.0 lb

## 2012-12-06 DIAGNOSIS — G2581 Restless legs syndrome: Secondary | ICD-10-CM

## 2012-12-06 DIAGNOSIS — R404 Transient alteration of awareness: Secondary | ICD-10-CM

## 2012-12-06 DIAGNOSIS — R569 Unspecified convulsions: Secondary | ICD-10-CM

## 2012-12-06 LAB — BASIC METABOLIC PANEL
CO2: 30 mEq/L (ref 19–32)
Chloride: 100 mEq/L (ref 96–112)
Glucose, Bld: 90 mg/dL (ref 70–99)
Sodium: 136 mEq/L (ref 135–145)

## 2012-12-06 NOTE — Addendum Note (Signed)
Addended by: Lelon Huh on: 12/06/2012 02:44 PM   Modules accepted: Orders

## 2012-12-06 NOTE — Progress Notes (Signed)
Beth Roberts was seen today in neurologic consultation at the request of Roxy Manns, MD.  The consultation is for the evaluation of RLS and staring spells.  This patient is accompanied in the office by her child who supplements the history.  The patient reports a restlessness in the legs at night.  She wants to move them around.  It was preventing her from sleeping.  She was started on Requip on 11/25/2012.  The patient reports benefit with this medication.  She reports that it is 85% better and she has not finished titrating up.  She denies SE with the medication.  The patient's daughter is very concerned about some staring spells intermittently over the last year.  The patient feels convinced that she knows what is going on around her and that she is just daydreaming about the past.  Her daughter, however, is concerned that this is something more.  She will try to get her mother's attention and she ignores her.  Her daughter states that it is like the patient "checks out" for a minute.  The patient admits that she never hears her daughter calling her name, but she thinks that is because she is so deep in thought.  Her daughter has never noted it in the middle of a sentence.  She drives without trouble.  There is no known initiating factors.  Her daughter states it happens more than once per day.  She is able to stand and maintain posture during an event.  Sometimes, she will seem confused right after, but sometimes not.  She just quit working in July 2013 and her daughter states that her work called her and said that she would stare off at work and would not answer.  There is no loss of bladder control.     Neuroimaging has not previously been performed.    PREVIOUS MEDICATIONS: n/a  ALLERGIES:   Allergies  Allergen Reactions  . Codeine     REACTION: nausea and vomiting  . Penicillins     REACTION: breaks out  . Sulfonamide Derivatives     CURRENT MEDICATIONS:  Current Outpatient  Prescriptions on File Prior to Visit  Medication Sig Dispense Refill  . Ascorbic Acid (VITAMIN C) 500 MG tablet Take 500 mg by mouth Nightly.       . Calcium Carb-Cholecalciferol [phone] MG-UNIT CAPS Take 1 capsule by mouth daily.        . Cholecalciferol (VITAMIN D) 2000 UNITS tablet Take 2,000 Units by mouth daily.        . cloNIDine (CATAPRES) 0.1 MG tablet TAKE ONE TABLET EACH AM AND ONE TABLET AT LUNCH  60 tablet  2  . ezetimibe-simvastatin (VYTORIN) 10-20 MG per tablet Take 1 tablet by mouth at bedtime.  30 tablet  11  . fish oil-omega-3 fatty acids 1000 MG capsule Take 1 g by mouth daily.       . hydrochlorothiazide (HYDRODIURIL) 25 MG tablet Take 0.5 tablets (12.5 mg total) by mouth daily as needed.  30 tablet  11  . Multiple Vitamins-Minerals (WOMENS MULTIVITAMIN PLUS) TABS Take 1 tablet by mouth daily.        . Potassium 99 MG TABS Take 1 tablet by mouth daily.        . ranitidine (ZANTAC) 150 MG tablet Take 1 tablet (150 mg total) by mouth 2 (two) times daily.  60 tablet  11  . rOPINIRole (REQUIP) 0.5 MG tablet Take 2 tablets (1 mg total) by mouth at bedtime.  30  tablet  5  . sennosides-docusate sodium (SENOKOT-S) 8.6-50 MG tablet Take 1 tablet by mouth daily.      . vitamin B-12 (CYANOCOBALAMIN) 1000 MCG tablet Take 1,000 mcg by mouth daily. Take one by mouth at bedtime         PAST MEDICAL HISTORY:   Past Medical History  Diagnosis Date  . GERD (gastroesophageal reflux disease)     hiatal henia  . Hyperlipidemia   . Hypertension   . Osteoporosis   . Allergy     allergic rhinitis  . 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 Appendectomy   . Eye surgery     cataract extraction, bilateral  . Abdominal hysterectomy     partial ; bleeding  . Lung removal, partial 1988    Lung -wedge resection/s/p sequestration (Has scar  tissue on cxr now)  . Shoulder surgery 2000-2001    Bilaterial  . Foot ganglion excision   . Fracture surgery 2008    left shoulder   . Fracture left shoulder 2008  . Cystoscopy 1993    bladder diverticulum  . Ct lung screening 12/2004    right basilor ? mass// Lung CT stable 05/2005    SOCIAL HISTORY:   History   Social History  . Marital Status: Widowed    Spouse Name: N/A    Number of Children: N/A  . Years of Education: N/A   Occupational History  . 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 Smoking status: Never Smoker   . Smokeless tobacco: Never Used  . Alcohol Use: No  . Drug Use: No  . Sexually Active: Not on file   Other Topics Concern  . Not on file   Social History Narrative  . No narrative on file    FAMILY HISTORY:   Family Status  Relation Status Death Age  . Mother Deceased     CVA  . Father Deceased     MI  . Brother Alive     PN  . Child Alive     1, adopted    ROS:  A complete 10 system review of systems was obtained and was unremarkable apart from what is mentioned above.  PHYSICAL EXAMINATION:    VITALS:   Filed Vitals:   12/06/12 1313  BP: 146/84  Pulse: 84  Temp: 97.5 F (36.4 C)  Resp: 18  Weight: 91 lb (41.277 kg)    GEN:  Normal appears female in no acute distress.  Appears stated age. HEENT:  Normocephalic, atraumatic. The mucous membranes are moist. The superficial temporal arteries are without ropiness or tenderness. Cardiovascular: Regular rate and rhythm. Lungs: Clear to auscultation bilaterally. Neck/Heme: There are no carotid bruits noted bilaterally.  NEUROLOGICAL: Orientation:  The patient is alert and oriented x 3.  Fund of knowledge is appropriate.  Recent and remote memory intact.  Attention span and concentration normal.  Repeats and names without difficulty.  She scores a 4/4 on clock drawing on 2nd attempt.  She copies a drawing without trouble. Cranial nerves:  There is good facial symmetry. The pupils are equal round and reactive to light bilaterally. Fundoscopic exam reveals clear disc margins bilaterally. Extraocular muscles are intact and visual fields are full to confrontational testing. Speech is fluent and  clear. Soft palate rises symmetrically and there is no tongue deviation. Hearing is intact to conversational tone. Tone: There is some questionable increased tone in the left upper extremity, versus gegenhalten. Sensation: Sensation is intact to light touch and pinprick throughout (facial, trunk, extremities). Vibration is intact at the bilateral big toe. There is no extinction with double simultaneous stimulation. There is no sensory dermatomal level identified. Coordination:  The patient has no difficulty with RAM's or FNF bilaterally. Motor: Strength is 5/5 in the bilateral upper and lower extremities.  Shoulder shrug is equal and symmetric. There is no pronator drift.  There are no fasciculations noted. DTR's: Deep tendon reflexes are 3/4 at the bilateral biceps, triceps, brachioradialis, patella and achilles.  Plantar responses are downgoing bilaterally. Gait and Station: The patient is able to ambulate without difficulty. There is decreased arm swing b/l  Labs:  Lab Results  Component Value Date   WBC 5.3 11/25/2012   HGB 12.9 11/25/2012   HCT 38.4 11/25/2012   MCV 95.2 11/25/2012   PLT 347.0 11/25/2012     Chemistry      Component Value Date/Time   NA 139 04/05/2012 1308   K 4.1 04/05/2012 1308   CL 102 04/05/2012 1308   CO2 31 04/05/2012 1308   BUN 17 04/05/2012 1308   CREATININE 0.6 04/05/2012 1308      Component Value Date/Time   CALCIUM 9.3 04/05/2012 1308   ALKPHOS 59 04/05/2012 1308   AST 21 04/05/2012 1308   ALT 16 04/05/2012 1308   BILITOT 0.2* 04/05/2012 1308     Lab Results  Component Value Date   TSH 1.22 04/05/2012      IMPRESSION/PLAN:  1. transient alteration of awareness.  -I agree with the patient's daughter that  we need to make sure that this is not something more than the patient does not paying attention.  -She will have an MRI of the brain at [facility].  -She will have an EEG.  If that is normal, I anticipate scheduling her an ambulatory EEG at [facility].  The patient and her daughter were agreeable with this approach.    -sz/safety discussed.  -She is to call me after her MRI and EEG has been completed for the results. 2.  restless leg syndrome.  -She is improved on Requip.  There is no evidence of anemia or thyroid dysfunction contributing. 3.  followup with me will be after the above has been completed.

## 2012-12-06 NOTE — Patient Instructions (Addendum)
1.  Get MRI completed.  It is scheduled for Friday, Feb. 7th at 3:15pm at [facility] [address] [place], [place] [address] [phone]  2.  Get EEG done.  We will call you with date and time. 3.  Call me one week after the above are completed and we will decide then whether or not we need to proceed with the longer, 24 hour EEG

## 2012-12-06 NOTE — Addendum Note (Signed)
Addended by: Legrand Como on: 12/06/2012 02:54 PM   Modules accepted: Orders

## 2012-12-17 ENCOUNTER — Telehealth: Payer: Self-pay | Admitting: Neurology

## 2012-12-17 NOTE — Telephone Encounter (Signed)
Forward to 2 pages from [facility] to Dr. Arbutus Leas

## 2012-12-25 ENCOUNTER — Telehealth: Payer: Self-pay

## 2012-12-25 ENCOUNTER — Ambulatory Visit (HOSPITAL_COMMUNITY)
Admission: RE | Admit: 2012-12-25 | Discharge: 2012-12-25 | Disposition: A | Discharge status: Home / Self Care | Payer: Medicare Other | Source: Ambulatory Visit | Attending: Neurology | Admitting: Neurology

## 2012-12-25 DIAGNOSIS — R404 Transient alteration of awareness: Secondary | ICD-10-CM

## 2012-12-25 NOTE — Telephone Encounter (Signed)
Aware- I will see her then and she was advised to call if worse  Please check on her Thursday to see how she is doing-thanks

## 2012-12-25 NOTE — Telephone Encounter (Signed)
Pt walked in feeling little jerky and nervous; pt had EEG today. Pt wanted to know if need to schedule appt for 1-2 weeks BP has been elevated; today 1 hr ago BP was 175/92. 30 mins ago pt took Clonidine. BP now 179/92. No chest pain,h/a,dizziness or SOB. Pt appears OK and stated she felt less jerky since she had been at office. Dr Milinda Antis advised pt to schedule appt. Pt to see Dr Milinda Antis 12/27/12 at 10:15 am; advised pt if condition changes or worsens to call office or go to [facility] if at night.pt voiced understanding.Pt said was going home to rest.

## 2012-12-25 NOTE — Progress Notes (Signed)
EEG completed.

## 2012-12-26 ENCOUNTER — Telehealth: Payer: Self-pay | Admitting: Neurology

## 2012-12-26 NOTE — Procedures (Signed)
TECHNICAL SUMMARY:  A 24 channel referential and bipolar montage EEG using the standard international 10-20 system was performed on the patient described as awake, drowsy and asleep.  The dominant background activity consists of 8 hertz activity seen most prominantly over the anterior head region. 6-7 hertzcan be seen overriding in the central and frontotemporal head regions. The backgound activity is nonreactive to eye opening and closing procedures.  Low voltage fast (beta) activity is distributed symmetrically and maximally over the anterior head regions.  ACTIVATION:  Stepwise photic stimulation at 4-20 flashes per second was performed and did not elicit any abnormal waveforms.    EPILEPTIFORM ACTIVITY:  There were no spikes, sharp waves or paroxysmal activity.  SLEEP:  Both stage I and stage II sleep architecture were identified.  CARDIAC:  The EKG lead revealed a regular sinus rhythm.  IMPRESSION:  This is an abnormal EEG for the patient's stated age, demonstrating a mild diffuse slowing of electrocerebral activity.  This can be seen in a wide variety of encephalopathic state including those of a toxic, metabolic, or degenerative nature.  There was no focal, hemispheric, or lateralizing features.  No epileptiform activity was recorded.

## 2012-12-26 NOTE — Telephone Encounter (Signed)
Good, thanks.

## 2012-12-26 NOTE — Telephone Encounter (Signed)
Pt said she feel a little better and hasn't developed any new symptoms, she took her BP last night and the top # was 153, so it did come down some so pt said she is okay today and will keep her appt with [place] for tomorrow

## 2012-12-26 NOTE — Telephone Encounter (Signed)
Jan,  Please tell pt that her EEG did not show any seizure waves.  Schedule 24 hour EEG at [facility] (pt aware of this..See the pts chart).   Dx:  [phone].  Thanks

## 2012-12-27 ENCOUNTER — Ambulatory Visit (INDEPENDENT_AMBULATORY_CARE_PROVIDER_SITE_OTHER): Payer: Medicare Other | Admitting: Family Medicine

## 2012-12-27 ENCOUNTER — Encounter: Payer: Self-pay | Admitting: Family Medicine

## 2012-12-27 VITALS — BP 164/92 | HR 96 | Temp 98.6°F | Ht 58.5 in | Wt 90.5 lb

## 2012-12-27 DIAGNOSIS — I10 Essential (primary) hypertension: Secondary | ICD-10-CM

## 2012-12-27 MED ORDER — HYDROCHLOROTHIAZIDE 25 MG PO TABS: ORAL_TABLET | ORAL | Status: DC

## 2012-12-27 NOTE — Patient Instructions (Addendum)
Take 1/2 of an hctz pill every day for blood pressure - and if that does not bring it down enough we can increase to one whole pill daily  If any problems or side effects let me know  Follow up in 2-3 months  Take care of yourself - stay active

## 2012-12-27 NOTE — Progress Notes (Signed)
Subjective:    Patient ID: Beth Roberts, female    DOB: 09-01-1925, 79 y.o.   MRN: [id number]  HPI Here for HTN  Previously ace was d/c due to voice dysf Then benicar hct lowered bp too much and was stopped Recently bp has been up   BP Readings from Last 3 Encounters:  12/27/12 164/92  12/06/12 146/84  11/25/12 132/68    Was feeling very jittery and nervous about the bp - and that probably added to it  Last nt 179/102 - and that was highest so far  Went down after her med - then 144 systolic (catapress)   She does get a small ha when it is up   Patient Active Problem List  Diagnosis  . HYPOGLYCEMIA, UNSPECIFIED  . HYPERLIPIDEMIA  . HYPERTENSION  . Seasonal and perennial allergic rhinitis  . COPD  . GERD  . OSTEOPOROSIS  . EPISTAXIS  . Other screening mammogram  . Varicose veins  . Hoarseness, chronic  . Restless legs syndrome (RLS)  . Staring spell   Past Medical History  Diagnosis Date  . GERD (gastroesophageal reflux disease)     hiatal henia  . Hyperlipidemia   . Hypertension   . Osteoporosis   . Allergy     allergic rhinitis  . COPD (chronic obstructive pulmonary disease)     congenital Lung Deformity//Scar tissue on lung( cxr) after surgery  )   Past Surgical History  Procedure Laterality Date  . Appendectomy    . Eye surgery      cataract extraction, bilateral  . Abdominal hysterectomy      partial ; bleeding  . Lung removal, partial  1988    Lung -wedge resection/s/p sequestration (Has scar tissue on cxr now)  . Shoulder surgery  2000-2001    Bilaterial  . Foot ganglion excision    . Fracture surgery  2008    left shoulder   . Fracture left shoulder  2008  . Cystoscopy  1993    bladder diverticulum  . Ct lung screening  12/2004    right basilor ? mass// Lung CT stable 05/2005   History  Substance Use Topics  . Smoking status: Never Smoker   . Smokeless tobacco: Never Used  . Alcohol Use: No   Family History  Problem Relation Age of  Onset  . Cancer Mother     breast  . Hypertension Mother   . Stroke Mother   . Heart disease Father     MI  . Hyperlipidemia Brother   . Hypertension Brother    Allergies  Allergen Reactions  . Ace Inhibitors     Voice dysfunction  . Codeine     REACTION: nausea and vomiting  . Penicillins     REACTION: breaks out  . Sulfonamide Derivatives    Current Outpatient Prescriptions on File Prior to Visit  Medication Sig Dispense Refill  . Ascorbic Acid (VITAMIN C) 500 MG tablet Take 500 mg by mouth Nightly.       . Calcium Carb-Cholecalciferol [phone] MG-UNIT CAPS Take 1 capsule by mouth daily.        . Cholecalciferol (VITAMIN D) 2000 UNITS tablet Take 2,000 Units by mouth daily.        . cloNIDine (CATAPRES) 0.1 MG tablet TAKE ONE TABLET EACH AM AND ONE TABLET AT LUNCH  60 tablet  2  . ezetimibe-simvastatin (VYTORIN) 10-20 MG per tablet Take 1 tablet by mouth at bedtime.  30 tablet  11  . fish  oil-omega-3 fatty acids 1000 MG capsule Take 1 g by mouth daily.       . Multiple Vitamins-Minerals (WOMENS MULTIVITAMIN PLUS) TABS Take 1 tablet by mouth daily.        . Potassium 99 MG TABS Take 1 tablet by mouth daily.        . ranitidine (ZANTAC) 150 MG tablet Take 1 tablet (150 mg total) by mouth 2 (two) times daily.  60 tablet  11  . rOPINIRole (REQUIP) 0.5 MG tablet Take 2 tablets (1 mg total) by mouth at bedtime.  30 tablet  5  . sennosides-docusate sodium (SENOKOT-S) 8.6-50 MG tablet Take 1 tablet by mouth daily.      . vitamin B-12 (CYANOCOBALAMIN) 1000 MCG tablet Take 1,000 mcg by mouth daily. Take one by mouth at bedtime        No current facility-administered medications on file prior to visit.     Review of Systems Review of Systems  Constitutional: Negative for fever, appetite change, fatigue and unexpected weight change.  Eyes: Negative for pain and visual disturbance.  Respiratory: Negative for cough and shortness of breath.   Cardiovascular: Negative for cp or  palpitations    Gastrointestinal: Negative for nausea, diarrhea and constipation.  Genitourinary: Negative for urgency and frequency.  Skin: Negative for pallor or rash   Neurological: Negative for weakness, light-headedness and numbness Hematological: Negative for adenopathy. Does not bruise/bleed easily.  Psychiatric/Behavioral: Negative for dysphoric mood. The patient is nervous/anxious.  - feels jittery       Objective:   Physical Exam  Constitutional: She appears well-developed and well-nourished. No distress.  HENT:  Head: Normocephalic and atraumatic.  Eyes: Conjunctivae and EOM are normal. Pupils are equal, round, and reactive to light. No scleral icterus.  Neck: Normal range of motion. Neck supple. No JVD present. Carotid bruit is not present. No thyromegaly present.  Cardiovascular: Normal rate, regular rhythm and intact distal pulses.  Exam reveals no gallop.   Pulmonary/Chest: Effort normal and breath sounds normal. No respiratory distress. She has no wheezes.  Abdominal: Soft. Bowel sounds are normal. She exhibits no distension and no abdominal bruit.  Lymphadenopathy:    She has no cervical adenopathy.  Neurological: She is alert. She has normal reflexes.  Skin: Skin is warm and dry. No pallor.  Psychiatric: She has a normal mood and affect.          Assessment & Plan:

## 2012-12-27 NOTE — Assessment & Plan Note (Signed)
Pt had hoarseness on ace and then hypotension on benicar hct  Will change to hct plain 12.5 and if not enough control-inc to 25 (she has taken hct prn in the past with no problems at all) Pt understood directions  Anxiety may play a role as well  F/u 2-3 mo for visit/ labs

## 2012-12-30 ENCOUNTER — Telehealth: Payer: Self-pay | Admitting: Family Medicine

## 2012-12-30 ENCOUNTER — Other Ambulatory Visit: Payer: Self-pay | Admitting: Neurology

## 2012-12-30 DIAGNOSIS — R404 Transient alteration of awareness: Secondary | ICD-10-CM

## 2012-12-30 MED ORDER — AMLODIPINE BESYLATE 5 MG PO TABS: 5.0000 mg | ORAL_TABLET | Freq: Every day | ORAL | Status: DC

## 2012-12-30 NOTE — Addendum Note (Signed)
Addended by: Roxy Manns A on: 12/30/2012 04:56 PM   Modules accepted: Orders

## 2012-12-30 NOTE — Telephone Encounter (Signed)
Done. Sherron Monday with Andrey Campanile at the [facility] at [place]. All information faxed as requested.

## 2012-12-30 NOTE — Addendum Note (Signed)
Addended by: Shon Millet on: 12/30/2012 05:03 PM   Modules accepted: Orders

## 2012-12-30 NOTE — Telephone Encounter (Signed)
Pt said she has taken the HCTZ pill 1/2 tab BID today and she just checked her BP and it was 180/103, pt wants to know what to do and she wanted me to ask you if she could take her clonidine TID, please advise

## 2012-12-30 NOTE — Telephone Encounter (Signed)
Pt said she already tried to take the whole pill in the am and it didn't work so she is going to try to take 1/2 tab BID and call us back if that doesn't help

## 2012-12-30 NOTE — Telephone Encounter (Signed)
Tell her for the hydrodiuril (hctz ) to either take 1/2 in am and 1/2 mid day or take the whole pill in the am and see how that works- update me if no improvement

## 2012-12-30 NOTE — Telephone Encounter (Signed)
?   Why the word prostate cancer just came up?  Please call if any side effects or problems and continue the hctz 1/2 bid

## 2012-12-30 NOTE — Telephone Encounter (Signed)
Pt notified and Rx sent to pharmacy, pt advise to call later in the week with BP readings

## 2012-12-30 NOTE — Telephone Encounter (Signed)
Cannot dose clonidine more often I would try benicar hct but she became hypotensive on it  Lets add norvasc (amlodipine 5 mg ) daily  Update me later in the week  prostate cancer

## 2012-12-30 NOTE — Telephone Encounter (Signed)
Patient Information:  Caller Name: Beth Roberts  Phone: [phone]  Patient: Beth Roberts  Gender: Female  DOB: Mar 24, 1925  Age: 79 Years  PCP: Tower, [employer] (Family Practice)  Office Follow Up:  Does the office need to follow up with this patient?: Yes  Instructions For The Office: see note   Symptoms  Reason For Call & Symptoms: Patient calling, her blood pressure is going up in the middle of the day and she thinks that she needs one to take at that time.  She is currently on Clonidine bid.  Yesterday her b/p was 181/118 and she went ahead and took the pm dose of her medication.  She retook her b/p before going to bed and same 146/80.  This am her b/p was 168/101.   After taking her medication this morning her b/p is now 137/81, 110.   Her medications have been changed recently and she thinks that it needs to be adjusted again.  Denies any chest pain or shortness of breath or headaches.  Was seen last in the office 2/21.  Reviewed Health History In EMR: N/A  Reviewed Medications In EMR: N/A  Reviewed Allergies In EMR: N/A  Reviewed Surgeries / Procedures: N/A  Date of Onset of Symptoms: 12/29/2012  Treatments Tried: took her Clonidine early on Sunday  Treatments Tried Worked: No  Guideline(s) Used:  High Blood Pressure  Disposition Per Guideline:   See Within 2 Weeks in [employer]  Reason For Disposition Reached:   BP > 160/100  Advice Given:  N/A  RN Overrode Recommendation:  Patient Requests Prescription  wanting a b/p pill for mid-day to keep her pressure down

## 2013-01-03 NOTE — Telephone Encounter (Signed)
Before going back to that increase amlodipine over the weekend to 2 pills once daily -lets see how she does  If not effective I may try something similar to the ramipril

## 2013-01-03 NOTE — Telephone Encounter (Signed)
Pt called to let you know her BP has still been running high her last BP reading was today and it was 161/84, pt wants to know if she should start back taking her ramipril. On 08/29/13 (note in chart) Dr. Maple Hudson advise pt to stop the ramipril and try the sample he gave her because the ramipril was causing her to cough and be hoarsed, pt said she has been on ramipril for years and that is the only thing that has controlled her BP, pt said she only has 5 pills left so if it's ok with you for her to start back taking the ramipril pt needs a new Rx, please advise

## 2013-01-06 NOTE — Telephone Encounter (Signed)
Advise pt to take 2 pills today and tomorrow and call us Wednesday to let us know how BP has been running pt verbalized understanding

## 2013-01-14 ENCOUNTER — Telehealth: Payer: Self-pay

## 2013-01-14 MED ORDER — LOSARTAN POTASSIUM 50 MG PO TABS: 50.0000 mg | ORAL_TABLET | Freq: Every day | ORAL | Status: DC

## 2013-01-14 NOTE — Telephone Encounter (Signed)
Even thought it sounds like amlodipine is controlling bp - I do not like the side effects so stop it  Change to losartan 50 mg once daily - monitor bp and let me know how it is doing next week , or update if side effects  If this affects voice or causes cough- let me know Please call in

## 2013-01-14 NOTE — Telephone Encounter (Addendum)
Pt has been out of town staying with family due to weather; pt said after increasing amlodipine to 2 a day; pt said when takes amlodipine around 1 pm two hours later pt feels very flush and red in face; dizziness also. Pt said BP averaging 137-140/77-80 since increase med. Pt wants to know if needs appt with Dr Milinda Antis.Please advise.pt not having any h/a.chest pain or SOB.

## 2013-01-14 NOTE — Telephone Encounter (Signed)
Pt notified of Dr. Royden Purl recommendations and Rx sent to pharmacy

## 2013-01-20 ENCOUNTER — Telehealth: Payer: Self-pay | Admitting: Family Medicine

## 2013-01-20 NOTE — Telephone Encounter (Signed)
This is an MRI from [facility] which is why its not in system.  Apparently, the scan center has not scanned it through although I looked at results weeks ago.  Jan, will you call [facility] again and get a copy of report?  Also, call pt and tell her it did not show reason for seizure.  Showed some mild hardening of arteries which can happen with age and brain has shrunk some with age.

## 2013-01-20 NOTE — Telephone Encounter (Signed)
Called and spoke with the patient. Information given as per Dr. Arbutus Leas re: MRI results. The patient denies any further spells and attributes the earlier staring spells to "just thinking hard about things." I told her to call if any questions or concerns. She states she will.

## 2013-01-20 NOTE — Telephone Encounter (Signed)
I do not see any results in epic (I do see order, but not test) Will forward to Dr Tat

## 2013-01-20 NOTE — Telephone Encounter (Signed)
Pt said that Dr. Arbutus Leas did an MRI of her brain last month and she hasn't heard anything back about the results, I didn't see an MRI done but I see one ordered in EPIC, pt said she hasn't heard anything back from Dr. Don Perking office and can't get in contact with anyone ther so she called her PCP to see if Dr. Milinda Antis has MRI results,  I didn't see results in EPIC and advise pt but she wanted me to still ask if Dr. Milinda Antis had any results, please advise

## 2013-01-23 ENCOUNTER — Encounter: Payer: Self-pay | Admitting: Neurology

## 2013-02-12 ENCOUNTER — Ambulatory Visit (INDEPENDENT_AMBULATORY_CARE_PROVIDER_SITE_OTHER): Payer: Medicare Other | Admitting: Family Medicine

## 2013-02-12 ENCOUNTER — Encounter: Payer: Self-pay | Admitting: Family Medicine

## 2013-02-12 VITALS — BP 120/80 | HR 88 | Temp 97.7°F | Ht 58.5 in | Wt 90.0 lb

## 2013-02-12 DIAGNOSIS — G2581 Restless legs syndrome: Secondary | ICD-10-CM

## 2013-02-12 DIAGNOSIS — E785 Hyperlipidemia, unspecified: Secondary | ICD-10-CM

## 2013-02-12 DIAGNOSIS — I10 Essential (primary) hypertension: Secondary | ICD-10-CM

## 2013-02-12 LAB — LIPID PANEL
HDL: 78.7 mg/dL (ref >39.00)
Total CHOL/HDL Ratio: 3
Triglycerides: 76 mg/dL (ref 0.0–149.0)

## 2013-02-12 LAB — LDL CHOLESTEROL, DIRECT: Direct LDL: 120.1 mg/dL

## 2013-02-12 NOTE — Assessment & Plan Note (Signed)
Pt has some side eff of requip- dizzy the next day - I suspect other meds like klonopin or gabapentin would do the same so for now will not px new med

## 2013-02-12 NOTE — Assessment & Plan Note (Signed)
bp in fair control at this time  No changes needed  Disc lifstyle change with low sodium diet and exercise  Doing well with losartan and no side effects

## 2013-02-12 NOTE — Progress Notes (Signed)
Subjective:    Patient ID: Beth Roberts, female    DOB: 1925-09-19, 79 y.o.   MRN: [id number]  HPI Here for HTN and pt would like her cholesterol checked  She takes fish oil tablets and eats a healthy diet Eats a healthy diet  She is on vytorin - ran out of that   Is on losartan now -no side effects  bp is stable today  No cp or palpitations or headaches or edema  No side effects to medicines  BP Readings from Last 3 Encounters:  02/12/13 120/80  12/27/12 164/92  12/06/12 146/84     She was on requip for restless legs - and she thinks it sedates her the next day and it also causes constipation  Unsure if it is worth the side effects  Her legs do keep her up at night Her friend rubs a salve into hers -? What the name is Tries to stretch No cramps   Lab Results  Component Value Date   WBC 5.3 11/25/2012   HGB 12.9 11/25/2012   HCT 38.4 11/25/2012   MCV 95.2 11/25/2012   PLT 347.0 11/25/2012     Patient Active Problem List  Diagnosis  . HYPOGLYCEMIA, UNSPECIFIED  . HYPERLIPIDEMIA  . HYPERTENSION  . Seasonal and perennial allergic rhinitis  . COPD  . GERD  . OSTEOPOROSIS  . EPISTAXIS  . Other screening mammogram  . Varicose veins  . Hoarseness, chronic  . Restless legs syndrome (RLS)  . Staring spell   Past Medical History  Diagnosis Date  . GERD (gastroesophageal reflux disease)     hiatal henia  . Hyperlipidemia   . Hypertension   . Osteoporosis   . Allergy     allergic rhinitis  . COPD (chronic obstructive pulmonary disease)     congenital Lung Deformity//Scar tissue on lung( cxr) after surgery  )   Past Surgical History  Procedure Laterality Date  . Appendectomy    . Eye surgery      cataract extraction, bilateral  . Abdominal hysterectomy      partial ; bleeding  . Lung removal, partial  1988    Lung -wedge resection/s/p sequestration (Has scar tissue on cxr now)  . Shoulder surgery  2000-2001    Bilaterial  . Foot ganglion excision    . Fracture  surgery  2008    left shoulder   . Fracture left shoulder  2008  . Cystoscopy  1993    bladder diverticulum  . Ct lung screening  12/2004    right basilor ? mass// Lung CT stable 05/2005   History  Substance Use Topics  . Smoking status: Never Smoker   . Smokeless tobacco: Never Used  . Alcohol Use: No   Family History  Problem Relation Age of Onset  . Cancer Mother     breast  . Hypertension Mother   . Stroke Mother   . Heart disease Father     MI  . Hyperlipidemia Brother   . Hypertension Brother    Allergies  Allergen Reactions  . Ace Inhibitors     Voice dysfunction  . Amlodipine     Flushing/ dizziness  . Codeine     REACTION: nausea and vomiting  . Penicillins     REACTION: breaks out  . Sulfonamide Derivatives    Current Outpatient Prescriptions on File Prior to Visit  Medication Sig Dispense Refill  . Ascorbic Acid (VITAMIN C) 500 MG tablet Take 500 mg by mouth Nightly.       Marland Kitchen  Calcium Carb-Cholecalciferol [phone] MG-UNIT CAPS Take 1 capsule by mouth daily.        . Cholecalciferol (VITAMIN D) 2000 UNITS tablet Take 2,000 Units by mouth daily.        . cloNIDine (CATAPRES) 0.1 MG tablet TAKE ONE TABLET EACH AM AND ONE TABLET AT LUNCH  60 tablet  2  . ezetimibe-simvastatin (VYTORIN) 10-20 MG per tablet Take 1 tablet by mouth at bedtime.  30 tablet  11  . fish oil-omega-3 fatty acids 1000 MG capsule Take 1 g by mouth daily.       . hydrochlorothiazide (HYDRODIURIL) 25 MG tablet Take 1/2 pill by mouth once daily  15 tablet  11  . losartan (COZAAR) 50 MG tablet Take 1 tablet (50 mg total) by mouth daily.  30 tablet  3  . Multiple Vitamins-Minerals (WOMENS MULTIVITAMIN PLUS) TABS Take 1 tablet by mouth daily.        . Potassium 99 MG TABS Take 1 tablet by mouth daily.        . ranitidine (ZANTAC) 150 MG tablet Take 1 tablet (150 mg total) by mouth 2 (two) times daily.  60 tablet  11  . rOPINIRole (REQUIP) 0.5 MG tablet Take 2 tablets (1 mg total) by mouth at  bedtime.  30 tablet  5  . sennosides-docusate sodium (SENOKOT-S) 8.6-50 MG tablet Take 1 tablet by mouth daily.      . vitamin B-12 (CYANOCOBALAMIN) 1000 MCG tablet Take 1,000 mcg by mouth daily. Take one by mouth at bedtime        No current facility-administered medications on file prior to visit.    Review of Systems Review of Systems  Constitutional: Negative for fever, appetite change, fatigue and unexpected weight change.  Eyes: Negative for pain and visual disturbance.  Respiratory: Negative for cough and shortness of breath.   Cardiovascular: Negative for cp or palpitations    Gastrointestinal: Negative for nausea, diarrhea and pos for constipation   Genitourinary: Negative for urgency and frequency.  Skin: Negative for pallor or rash   Neurological: Negative for weakness, light-headedness, numbness and headaches. pos for RLS Hematological: Negative for adenopathy. Does not bruise/bleed easily.  Psychiatric/Behavioral: Negative for dysphoric mood. The patient is not nervous/anxious.         Objective:   Physical Exam  Constitutional: She appears well-developed and well-nourished. No distress.  Slim well appearing elderly female  HENT:  Head: Normocephalic and atraumatic.  Mouth/Throat: Oropharynx is clear and moist.  Eyes: Conjunctivae and EOM are normal. Pupils are equal, round, and reactive to light. Right eye exhibits no discharge. Left eye exhibits no discharge. No scleral icterus.  Neck: Normal range of motion. Neck supple. No JVD present. Carotid bruit is not present. No thyromegaly present.  Cardiovascular: Normal rate, regular rhythm, normal heart sounds and intact distal pulses.  Exam reveals no gallop.   Pulmonary/Chest: Effort normal and breath sounds normal. No respiratory distress. She has no wheezes.  Abdominal: Soft. Bowel sounds are normal. She exhibits no distension, no abdominal bruit and no mass.  Musculoskeletal: She exhibits no edema.  Lymphadenopathy:     She has no cervical adenopathy.  Neurological: She is alert. She has normal reflexes. No cranial nerve deficit. She exhibits normal muscle tone. Coordination normal.  Skin: Skin is warm and dry. No rash noted. No pallor.  Psychiatric: She has a normal mood and affect.          Assessment & Plan:

## 2013-02-12 NOTE — Assessment & Plan Note (Signed)
Check lipid today Ran out of vytorin - curious to see what it is  Diet is great  Will update and re start med if needed

## 2013-02-12 NOTE — Patient Instructions (Addendum)
Stop the requip if the side effects are worse than the restless legs Blood pressure is great  Checking your cholesterol today - I will let know if you need to re start the vytroin  Take care of yourself

## 2013-02-13 ENCOUNTER — Encounter: Payer: Self-pay | Admitting: *Deleted

## 2013-02-25 ENCOUNTER — Other Ambulatory Visit: Payer: Self-pay | Admitting: Family Medicine

## 2013-02-25 ENCOUNTER — Telehealth: Payer: Self-pay

## 2013-02-25 NOTE — Telephone Encounter (Signed)
Pt checking on status of requip refill. Spoke with [place] at [facility] Requip ready for pick up . Pt will go to pharmacy now.

## 2013-03-03 ENCOUNTER — Telehealth: Payer: Self-pay

## 2013-03-03 MED ORDER — CLONAZEPAM 0.5 MG PO TABS: 0.5000 mg | ORAL_TABLET | Freq: Every evening | ORAL | Status: DC | PRN

## 2013-03-03 NOTE — Telephone Encounter (Signed)
We can try klonopin- but use caution with this-is sedating and can cause dizziness/ falls  Take it at bedtime and tell me if any problems or if it does not work  This is a controlled substance - so refils are limited (I will keep track of this) -and can be habit forming Please call .5 in to take qhs

## 2013-03-03 NOTE — Telephone Encounter (Signed)
I think she takes 1 mg - we can try going up on the dose to see if that helps - let me know if agreeable and I will call it in

## 2013-03-03 NOTE — Telephone Encounter (Signed)
Pt is afraid to increase the dose of the medication, pt said she is already having side eff of the medication, pt said she gets this nervous feeling, and feels jittery, and has trouble sleeping already, pt wanted to know if she could try another medication, pleases advise

## 2013-03-03 NOTE — Telephone Encounter (Signed)
Pt states Requip is not helping restless leg; pt has been using for 2 weeks. Pt takes at 10 pm and then wakes up at 1:30 - 2 am and cannot go back to sleep for several hours due to restless leg. Pt wants to know if anything else can do. [facility].Pt request call back.

## 2013-03-03 NOTE — Telephone Encounter (Signed)
Rx called in as prescribed and pt notified of Dr. Tower's comments/recommendations, pt verbalized understanding  

## 2013-03-04 MED ORDER — GABAPENTIN 100 MG PO CAPS: 100.0000 mg | ORAL_CAPSULE | Freq: Every day | ORAL | Status: DC

## 2013-03-04 NOTE — Telephone Encounter (Signed)
Pt notified to stop the klonopin an new Rx sent into pharmacy and let us know if how it goes, pt verbalized understanding

## 2013-03-04 NOTE — Telephone Encounter (Signed)
Take 100 mg about an hour before bedtime - let me know how it goes

## 2013-03-04 NOTE — Telephone Encounter (Signed)
I sent it to her pharmacy

## 2013-03-04 NOTE — Addendum Note (Signed)
Addended by: Roxy Manns A on: 03/04/2013 10:15 AM   Modules accepted: Orders, Medications

## 2013-03-04 NOTE — Telephone Encounter (Signed)
Ok - stop this and I will put on med intol list  Next thing to try is gabapentin- again watch out for sedation or dizziness (hopefully not like the klonopin)

## 2013-03-04 NOTE — Telephone Encounter (Signed)
Pt said the Klonopin caused her to be dizzy x 2 last night; almost fell forward but pt did not fall.pt said Klonopin caused pt to be awake all night;pt wants to stop Klonopin and start another med.Please advise. [facility]. Pt request call back.

## 2013-03-06 NOTE — Telephone Encounter (Signed)
Pt came by office; Gabapentin did not help RLS; pt said she thought it kept her awake. Gabapentin did not cause dizziness or sedated feeling. Pt said usually she can go to sleep easily but is awakened during night with RLS; pt said she cannot go thru another night without rest. -Pt is crying; she said she thinks some of problem may be nerves; pt is concerned about brother and sister that are sick, and pt has her home for sale, going to move to [place] to be closer to her daughter.Please advise. [facility]. Pt said needs call back today.

## 2013-03-06 NOTE — Telephone Encounter (Signed)
Patient notified as instructed by telephone. Pt scheduled appt to see Dr Milinda Antis 03/07/13 at 8 am. Pt is agreeable for neurology referral.

## 2013-03-06 NOTE — Telephone Encounter (Signed)
Left vm for pt to callback 

## 2013-03-06 NOTE — Telephone Encounter (Signed)
I will see her then and do neurology referral at that time

## 2013-03-06 NOTE — Telephone Encounter (Signed)
I would ordinarily increase the gabapentin, but not if she feels side effects from it (ie staying awake)  My next option - since we have tried several things so far - would be to refer her to Dr Tat in neurology- she specializes in movement disorders like RLS and could help [place] figure this out  meds for anxiety at night generally act like klonopin - and I know that did not agree with her  Let me know what she thinks about the referral and put her in to see me tomorrow if necessary about the anxiety issues

## 2013-03-07 ENCOUNTER — Encounter: Payer: Self-pay | Admitting: Family Medicine

## 2013-03-07 ENCOUNTER — Ambulatory Visit (INDEPENDENT_AMBULATORY_CARE_PROVIDER_SITE_OTHER): Payer: Medicare Other | Admitting: Family Medicine

## 2013-03-07 ENCOUNTER — Telehealth: Payer: Self-pay

## 2013-03-07 VITALS — BP 126/86 | HR 93 | Temp 97.6°F | Ht 58.5 in | Wt 89.0 lb

## 2013-03-07 DIAGNOSIS — G2581 Restless legs syndrome: Secondary | ICD-10-CM

## 2013-03-07 DIAGNOSIS — F430 Acute stress reaction: Secondary | ICD-10-CM | POA: Insufficient documentation

## 2013-03-07 MED ORDER — BUSPIRONE HCL 15 MG PO TABS: 7.5000 mg | ORAL_TABLET | Freq: Three times a day (TID) | ORAL | Status: DC

## 2013-03-07 NOTE — Telephone Encounter (Signed)
Sorry about that - I want her to take 1/2 tab twice a day    (if she does well we may increase to three times per day as needed ) Thanks

## 2013-03-07 NOTE — Assessment & Plan Note (Signed)
Long disc about symptoms of anxiety caused by stress reaction / stressors/ coping tech/ support and opt for tx  Plans to move in with daughter while her house sells  Will try buspar 7.5 bid if tolerated (disc poss side eff) Disc self care  Will update if worse  >25 min spent with face to face with patient, >50% counseling and/or coordinating care

## 2013-03-07 NOTE — Assessment & Plan Note (Signed)
Pt has tried requip / klonopin and gabapentin - all with side effects/sleeplessness or no efficacy Will have her f/u with Dr Tat for this and see what else can be done Honestly -anxiety may be playing a role also

## 2013-03-07 NOTE — Progress Notes (Signed)
Subjective:    Patient ID: Beth Roberts, female    DOB: 02-14-1925, 79 y.o.   MRN: [id number]  HPI Here for RLS and also anxiety  She has tried requip, klonopin and gabapentin for her RLS - all gave her significant side effects - such as dizziness / and kept her awake  Lab Results  Component Value Date   WBC 5.3 11/25/2012   HGB 12.9 11/25/2012   HCT 38.4 11/25/2012   MCV 95.2 11/25/2012   PLT 347.0 11/25/2012    Anxiety -new  Stressors include sister dx with alzeimers , and brother with severe neuropathy  This is a strain on her emotionally  Has house up for sale and she is moving to [place]   (her daughter is there) She is generally nervous and this keeps her from sleeping also  Tearful at times but denies depression   Also recently retired   She has to go and stay with her brother sometimes   Patient Active Problem List   Diagnosis Date Noted  . Restless legs syndrome (RLS) 11/25/2012  . Staring spell 11/25/2012  . Hoarseness, chronic 09/25/2012  . Other screening mammogram 09/25/2011  . Varicose veins 09/25/2011  . EPISTAXIS 01/20/2011  . HYPOGLYCEMIA, UNSPECIFIED 11/12/2008  . Seasonal and perennial allergic rhinitis 09/25/2007  . HYPERLIPIDEMIA 04/30/2007  . HYPERTENSION 04/30/2007  . COPD 04/30/2007  . GERD 04/30/2007  . OSTEOPOROSIS 04/30/2007   Past Medical History  Diagnosis Date  . GERD (gastroesophageal reflux disease)     hiatal henia  . Hyperlipidemia   . Hypertension   . Osteoporosis   . Allergy     allergic rhinitis  . COPD (chronic obstructive pulmonary disease)     congenital Lung Deformity//Scar tissue on lung( cxr) after surgery  )   Past Surgical History  Procedure Laterality Date  . Appendectomy    . Eye surgery      cataract extraction, bilateral  . Abdominal hysterectomy      partial ; bleeding  . Lung removal, partial  1988    Lung -wedge resection/s/p sequestration (Has scar tissue on cxr now)  . Shoulder surgery  2000-2001   Bilaterial  . Foot ganglion excision    . Fracture surgery  2008    left shoulder   . Fracture left shoulder  2008  . Cystoscopy  1993    bladder diverticulum  . Ct lung screening  12/2004    right basilor ? mass// Lung CT stable 05/2005   History  Substance Use Topics  . Smoking status: Never Smoker   . Smokeless tobacco: Never Used  . Alcohol Use: No   Family History  Problem Relation Age of Onset  . Cancer Mother     breast  . Hypertension Mother   . Stroke Mother   . Heart disease Father     MI  . Hyperlipidemia Brother   . Hypertension Brother    Allergies  Allergen Reactions  . Ace Inhibitors     Voice dysfunction  . Amlodipine     Flushing/ dizziness  . Codeine     REACTION: nausea and vomiting  . Klonopin (Clonazepam)     Dizzy/ sleepless  . Penicillins     REACTION: breaks out  . Requip (Ropinirole Hcl)     Not effective and makes her nervous  . Sulfonamide Derivatives    Current Outpatient Prescriptions on File Prior to Visit  Medication Sig Dispense Refill  . Ascorbic Acid (VITAMIN C) 500 MG tablet  Take 500 mg by mouth Nightly.       . Calcium Carb-Cholecalciferol [phone] MG-UNIT CAPS Take 1 capsule by mouth daily.        . Cholecalciferol (VITAMIN D) 2000 UNITS tablet Take 2,000 Units by mouth daily.        . cloNIDine (CATAPRES) 0.1 MG tablet TAKE ONE TABLET EACH AM AND ONE TABLET AT LUNCH  60 tablet  2  . ezetimibe-simvastatin (VYTORIN) 10-20 MG per tablet Take 1 tablet by mouth at bedtime.  30 tablet  11  . fish oil-omega-3 fatty acids 1000 MG capsule Take 1 g by mouth daily.       . hydrochlorothiazide (HYDRODIURIL) 25 MG tablet Take 1/2 pill by mouth once daily  15 tablet  11  . losartan (COZAAR) 50 MG tablet Take 1 tablet (50 mg total) by mouth daily.  30 tablet  3  . Multiple Vitamins-Minerals (WOMENS MULTIVITAMIN PLUS) TABS Take 1 tablet by mouth daily.        . Potassium 99 MG TABS Take 1 tablet by mouth daily.        . ranitidine (ZANTAC)  150 MG tablet Take 1 tablet (150 mg total) by mouth 2 (two) times daily.  60 tablet  11  . sennosides-docusate sodium (SENOKOT-S) 8.6-50 MG tablet Take 1 tablet by mouth daily.      . vitamin B-12 (CYANOCOBALAMIN) 1000 MCG tablet Take 1,000 mcg by mouth daily. Take one by mouth at bedtime        No current facility-administered medications on file prior to visit.    Review of Systems Review of Systems  Constitutional: Negative for fever, appetite change,  and unexpected weight change. pos for fatigue Eyes: Negative for pain and visual disturbance.  Respiratory: Negative for cough and shortness of breath.   Cardiovascular: Negative for cp or palpitations    Gastrointestinal: Negative for nausea, diarrhea and constipation.  Genitourinary: Negative for urgency and frequency.  Skin: Negative for pallor or rash   Neurological: Negative for weakness, light-headedness, numbness and headaches. pos for restless legs at night Hematological: Negative for adenopathy. Does not bruise/bleed easily.  Psychiatric/Behavioral: Negative for dysphoric mood. The patient is anxious and has difficulty sleeping .         Objective:   Physical Exam  Constitutional: She is oriented to person, place, and time. She appears well-developed and well-nourished. No distress.  Slim and well appearing elderly female  HENT:  Head: Normocephalic and atraumatic.  Eyes: Conjunctivae and EOM are normal. Pupils are equal, round, and reactive to light.  Neck: Normal range of motion. Neck supple.  Cardiovascular: Normal rate and regular rhythm.   Pulmonary/Chest: Effort normal and breath sounds normal. No respiratory distress.  Musculoskeletal: She exhibits no edema and no tenderness.  Lymphadenopathy:    She has no cervical adenopathy.  Neurological: She is alert and oriented to person, place, and time. She has normal reflexes. She displays no atrophy and no tremor. No cranial nerve deficit or sensory deficit. She exhibits  normal muscle tone. Coordination and gait normal.  Skin: Skin is warm and dry. No pallor.  Psychiatric: Her speech is normal and behavior is normal. Thought content normal. Her mood appears anxious. Her affect is not blunt, not labile and not inappropriate. Thought content is not paranoid. Cognition and memory are normal. She does not exhibit a depressed mood. She expresses no homicidal and no suicidal ideation.  Seems nervous  Is attentive and engaged with good eye contact  Not  tearful          Assessment & Plan:

## 2013-03-07 NOTE — Telephone Encounter (Signed)
Pt seen today and when picked up buspirone 15 mg instructions are take 1/2 tab three times a day. Pt said AVS paper said to take 7.5 mg twice a day. Pt request call back to verify instructions.Please advise.

## 2013-03-07 NOTE — Telephone Encounter (Signed)
Pt notified to take 1/2 tab BID but if she does well we may increase it to 1/2 TID, pt verbalized understanding

## 2013-03-07 NOTE — Patient Instructions (Addendum)
Talk to [facility] on the way out about your referrals to Dr Tat and a counselor  Try the buspar 1/2 pill twice daily for anxiety- if side effects or if it makes you feel worse- stop it and let me know  Take care of yourself and sleep when you can  Consider moving in with your daughter until your house sells

## 2013-03-09 ENCOUNTER — Other Ambulatory Visit: Payer: Self-pay | Admitting: Family Medicine

## 2013-03-10 ENCOUNTER — Telehealth: Payer: Self-pay

## 2013-03-10 MED ORDER — ALPRAZOLAM 0.25 MG PO TABS: 0.2500 mg | ORAL_TABLET | Freq: Every evening | ORAL | Status: DC | PRN

## 2013-03-10 NOTE — Telephone Encounter (Signed)
She can have 6 mo of refils , thanks

## 2013-03-10 NOTE — Telephone Encounter (Signed)
done

## 2013-03-10 NOTE — Telephone Encounter (Signed)
Rx called in as prescribed and pt notified of new Rx and Dr. Milinda Antis recommendations, pt verbalized understanding

## 2013-03-10 NOTE — Telephone Encounter (Signed)
Pt request call back from [facility]; to literature given to pt.

## 2013-03-10 NOTE — Telephone Encounter (Signed)
We do not have a lot of options for sleep at this point - we do want to be very cautious of falls We can try a low dose of xanax with great caution  Please call it in - let me know how it goes and follow up with Dr Tat as planned

## 2013-03-10 NOTE — Telephone Encounter (Signed)
Ok to refill 

## 2013-03-10 NOTE — Telephone Encounter (Signed)
Spoke to patient about her appt with Dr Kandice Moos, she will keep her appt and decide if she wants to have a second appt.

## 2013-03-10 NOTE — Telephone Encounter (Signed)
Pt said last night took Buspar 15 mg  1/2 tab tid and still did not sleep last night; pt finally slept for short period this morning at 6:15 am. Pt wants to know what she can do to sleep tonight. Pt has appt with Dr Arbutus Leas 03/11/13. [facility].

## 2013-03-11 ENCOUNTER — Encounter: Payer: Self-pay | Admitting: Neurology

## 2013-03-11 ENCOUNTER — Ambulatory Visit: Payer: Medicare Other | Admitting: Neurology

## 2013-03-11 ENCOUNTER — Ambulatory Visit (INDEPENDENT_AMBULATORY_CARE_PROVIDER_SITE_OTHER): Payer: Medicare Other | Admitting: Neurology

## 2013-03-11 VITALS — BP 130/80 | HR 92 | Temp 97.7°F | Resp 12 | Ht 59.0 in | Wt 88.0 lb

## 2013-03-11 DIAGNOSIS — G2581 Restless legs syndrome: Secondary | ICD-10-CM

## 2013-03-11 DIAGNOSIS — IMO0001 Reserved for inherently not codable concepts without codable children: Secondary | ICD-10-CM

## 2013-03-11 DIAGNOSIS — R569 Unspecified convulsions: Secondary | ICD-10-CM

## 2013-03-11 DIAGNOSIS — D509 Iron deficiency anemia, unspecified: Secondary | ICD-10-CM

## 2013-03-11 MED ORDER — PRAMIPEXOLE DIHYDROCHLORIDE 0.125 MG PO TABS: 0.1250 mg | ORAL_TABLET | Freq: Every evening | ORAL | Status: DC

## 2013-03-11 NOTE — Progress Notes (Signed)
Beth Roberts was seen today in neurologic consultation at the request of Roxy Manns, MD.  The consultation is for the evaluation of RLS and staring spells.  This patient is accompanied in the office by her friend who supplements the history.  I reviewed her med records since last visit.  The patient reports a restlessness in the legs at night.  She wants to move them around.  It is preventing her from sleeping.  She was started on Requip on 11/25/2012.  The patient last visit reported benefit with this medication. However, it caused sleepiness and the medication was changed to clonazepam.  She did not take this long but stated that it caused dizziness.  She tried Neurontin, 100 mg at night.  Again, she'll try this for about a day but she felt panicky.  She is here today to discuss further options. She took xanax last night but slept "too good" and had a hangover effect this AM.    Last visit, her daughter was concerned about some staring spells.  The patient was convinced that she knew everything that was going on around her, but was in he thought.  She did have an MRI of the brain since last visit.  This revealed atrophy and moderate small vessel disease.  Her EEG just revealed some mild slowing.  She refused the ambulatory EEG we sent her for.    Neuroimaging has  previously been performed.  As above, it was done on February, 2014.  There was atrophy and moderate small vessel disease.  PREVIOUS MEDICATIONS: Requip, initially helped but then became sleepy; Klonopin, dizzy after one dose; Neurontin, 100 mg-one pill and felt panicky and no help; xanax - too sleepy  ALLERGIES:   Allergies  Allergen Reactions  . Ace Inhibitors     Voice dysfunction  . Amlodipine     Flushing/ dizziness  . Codeine     REACTION: nausea and vomiting  . Klonopin (Clonazepam)     Dizzy/ sleepless  . Penicillins     REACTION: breaks out  . Requip (Ropinirole Hcl)     Not effective and makes her nervous  . Sulfonamide  Derivatives     CURRENT MEDICATIONS:  Current Outpatient Prescriptions on File Prior to Visit  Medication Sig Dispense Refill  . ALPRAZolam (XANAX) 0.25 MG tablet Take 1 tablet (0.25 mg total) by mouth at bedtime as needed for sleep.  15 tablet  0  . Ascorbic Acid (VITAMIN C) 500 MG tablet Take 500 mg by mouth Nightly.       . busPIRone (BUSPAR) 15 MG tablet Take 0.5 tablets (7.5 mg total) by mouth 3 (three) times daily.  30 tablet  5  . Calcium Carb-Cholecalciferol [phone] MG-UNIT CAPS Take 1 capsule by mouth daily.        . Cholecalciferol (VITAMIN D) 2000 UNITS tablet Take 2,000 Units by mouth daily.        . cloNIDine (CATAPRES) 0.1 MG tablet TAKE ONE TABLET EACH AM AND ONE TABLET AT LUNCH  60 tablet  5  . ezetimibe-simvastatin (VYTORIN) 10-20 MG per tablet Take 1 tablet by mouth at bedtime.  30 tablet  11  . fish oil-omega-3 fatty acids 1000 MG capsule Take 1 g by mouth daily.       . hydrochlorothiazide (HYDRODIURIL) 25 MG tablet Take 1/2 pill by mouth once daily  15 tablet  11  . losartan (COZAAR) 50 MG tablet Take 1 tablet (50 mg total) by mouth daily.  30 tablet  3  . Multiple Vitamins-Minerals (WOMENS MULTIVITAMIN PLUS) TABS Take 1 tablet by mouth daily.        . Potassium 99 MG TABS Take 1 tablet by mouth daily.        . ranitidine (ZANTAC) 150 MG tablet Take 1 tablet (150 mg total) by mouth 2 (two) times daily.  60 tablet  11  . sennosides-docusate sodium (SENOKOT-S) 8.6-50 MG tablet Take 1 tablet by mouth daily.      . vitamin B-12 (CYANOCOBALAMIN) 1000 MCG tablet Take 1,000 mcg by mouth daily. Take one by mouth at bedtime        No current facility-administered medications on file prior to visit.    PAST MEDICAL HISTORY:   Past Medical History  Diagnosis Date  . GERD (gastroesophageal reflux disease)     hiatal henia  . Hyperlipidemia   . Hypertension   . Osteoporosis   . Allergy     allergic rhinitis  . COPD (chronic obstructive pulmonary disease)     congenital  Lung Deformity//Scar tissue on lung( cxr) after surgery  )    PAST SURGICAL HISTORY:   Past Surgical History  Procedure Laterality Date  . Appendectomy    . Eye surgery      cataract extraction, bilateral  . Abdominal hysterectomy      partial ; bleeding  . Lung removal, partial  1988    Lung -wedge resection/s/p sequestration (Has scar tissue on cxr now)  . Shoulder surgery  2000-2001    Bilaterial  . Foot ganglion excision    . Fracture surgery  2008    left shoulder   . Fracture left shoulder  2008  . Cystoscopy  1993    bladder diverticulum  . Ct lung screening  12/2004    right basilor ? mass// Lung CT stable 05/2005    SOCIAL HISTORY:   History   Social History  . Marital Status: Widowed    Spouse Name: N/A    Number of Children: N/A  . Years of Education: N/A   Occupational History  . 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 Smoking status: Never Smoker   . Smokeless tobacco: Never Used  . Alcohol Use: No  . Drug Use: No  . Sexually Active: Not on file   Other Topics Concern  . Not on file   Social History Narrative  . No narrative on file    FAMILY HISTORY:   Family Status  Relation Status Death Age  . Mother Deceased     CVA  . Father Deceased     MI  . Brother Alive     PN  . Child Alive     1, adopted    ROS:  A complete 10 system review of systems was obtained and was unremarkable apart from what is mentioned above.  PHYSICAL EXAMINATION:    VITALS:   Filed Vitals:   03/11/13 1246  BP: 130/80  Pulse: 92  Temp: 97.7 F (36.5 C)  Resp: 12  Height: 4\' 11"  (1.499 m)  Weight: 88 lb (39.917 kg)    GEN:  Normal appears female in no acute distress.  Appears stated age. HEENT:  Normocephalic, atraumatic. The mucous membranes are moist. The superficial temporal arteries are without ropiness or tenderness. Cardiovascular: Regular rate and rhythm. Lungs: Clear to auscultation  bilaterally. Neck/Heme: There are no carotid bruits noted bilaterally.  NEUROLOGICAL: Orientation:  The patient  is alert and oriented x 3.  Seems somewhat more confused today but answers orientation questions. Cranial nerves: There is good facial symmetry. The pupils are equal round and reactive to light bilaterally. Fundoscopic exam reveals clear disc margins bilaterally. Extraocular muscles are intact and visual fields are full to confrontational testing. Speech is fluent and clear. Soft palate rises symmetrically and there is no tongue deviation. Hearing is intact to conversational tone. Tone: There is some questionable increased tone in the left upper extremity, versus gegenhalten. Sensation: Sensation is intact to light touch and pinprick throughout (facial, trunk, extremities). Vibration is intact at the bilateral big toe. There is no extinction with double simultaneous stimulation. There is no sensory dermatomal level identified. Coordination:  The patient has no difficulty with RAM's or FNF bilaterally. Motor: Strength is 5/5 in the bilateral upper and lower extremities.  Shoulder shrug is equal and symmetric. There is no pronator drift.  There are no fasciculations noted. DTR's: Deep tendon reflexes are 3/4 at the bilateral biceps, triceps, brachioradialis, patella and achilles.  Plantar responses are downgoing bilaterally. Gait and Station: The patient is able to ambulate without difficulty. There is decreased arm swing b/l  Labs:  Lab Results  Component Value Date   WBC 5.3 11/25/2012   HGB 12.9 11/25/2012   HCT 38.4 11/25/2012   MCV 95.2 11/25/2012   PLT 347.0 11/25/2012     Chemistry      Component Value Date/Time   NA 136 12/06/2012 1454   K 4.2 12/06/2012 1454   CL 100 12/06/2012 1454   CO2 30 12/06/2012 1454   BUN 21 12/06/2012 1454   CREATININE 0.7 12/06/2012 1454      Component Value Date/Time   CALCIUM 9.1 12/06/2012 1454   ALKPHOS 59 04/05/2012 1308   AST 21 04/05/2012 1308    ALT 16 04/05/2012 1308   BILITOT 0.2* 04/05/2012 1308     Lab Results  Component Value Date   TSH 1.22 04/05/2012   No results found for this basename: IRON, TIBC, FERRITIN      IMPRESSION/PLAN:  1. transient alteration of awareness.  -Her routine EEG just demonstrated some slowing and she has refused ambulatory EEG. 2.  restless leg syndrome.  -She is going to try mirapex but I told her that she needs to give it a fair trial before we increase the medication or change it.  Risks, benefits, side effects and alternative therapies were discussed.  The opportunity to ask questions was given and they were answered to the best of my ability.  The patient expressed understanding and willingness to follow the outlined treatment protocols. 3.  followup with me i 3 months, sooner should new neuro issues arise.

## 2013-03-11 NOTE — Patient Instructions (Addendum)
We will let you know about today's lab results.  Please make a three month follow up appointment with Dr. Arbutus Leas.

## 2013-03-14 ENCOUNTER — Telehealth: Payer: Self-pay

## 2013-03-14 NOTE — Telephone Encounter (Signed)
Pt just started Mirapex a couple of nights ago and wanted to know if it should have started working by now.  I told her to give it some more time.  It usually takes more than a couple of nights to be fully effective.  She will give it more time and let us know how she is doing.

## 2013-03-17 ENCOUNTER — Telehealth: Payer: Self-pay | Admitting: Family Medicine

## 2013-03-17 ENCOUNTER — Telehealth: Payer: Self-pay | Admitting: Neurology

## 2013-03-17 DIAGNOSIS — E785 Hyperlipidemia, unspecified: Secondary | ICD-10-CM

## 2013-03-17 DIAGNOSIS — I10 Essential (primary) hypertension: Secondary | ICD-10-CM

## 2013-03-17 DIAGNOSIS — M810 Age-related osteoporosis without current pathological fracture: Secondary | ICD-10-CM

## 2013-03-17 DIAGNOSIS — K219 Gastro-esophageal reflux disease without esophagitis: Secondary | ICD-10-CM

## 2013-03-17 LAB — IRON: Iron: 67 ug/dL (ref 42–145)

## 2013-03-17 NOTE — Telephone Encounter (Signed)
Message copied by Judy Pimple on Mon Mar 17, 2013  9:22 PM ------      Message from: Alvina Chou      Created: Thu Mar 06, 2013 12:39 PM      Regarding: lab orders for Tuesday, 5.13.14       Patient is scheduled for CPX labs, please order future labs, Thanks , Terri       ------

## 2013-03-17 NOTE — Telephone Encounter (Signed)
Patient left a VM requesting a CB. No reason given.

## 2013-03-17 NOTE — Telephone Encounter (Signed)
Have her take 2 at night but remind her again, that she needs to give it time to work

## 2013-03-17 NOTE — Telephone Encounter (Signed)
Why would she want to go to a lower dose?  Wouldn't she want a higher dose to see if that helps the restlessness?

## 2013-03-17 NOTE — Telephone Encounter (Signed)
Pt willing to try higher dose if you think it will help with her fidgetiness.

## 2013-03-17 NOTE — Telephone Encounter (Signed)
Pt still not doing well on the mirapex.  She is up most of the night and real fidgety.  Each night seems to get worse.  She would like to know if she can cut it in half and try that.

## 2013-03-18 ENCOUNTER — Other Ambulatory Visit: Payer: Medicare Other

## 2013-03-18 NOTE — Telephone Encounter (Signed)
Left msg for pt to call. 

## 2013-03-19 ENCOUNTER — Ambulatory Visit (INDEPENDENT_AMBULATORY_CARE_PROVIDER_SITE_OTHER): Payer: No Typology Code available for payment source | Admitting: Psychology

## 2013-03-19 DIAGNOSIS — F4322 Adjustment disorder with anxiety: Secondary | ICD-10-CM

## 2013-03-20 ENCOUNTER — Ambulatory Visit: Payer: Medicare Other | Admitting: Psychology

## 2013-03-21 NOTE — Telephone Encounter (Signed)
Pt notified and will let us know how she does.

## 2013-03-21 NOTE — Telephone Encounter (Signed)
Left msg for pt to call. 

## 2013-03-24 ENCOUNTER — Encounter: Payer: Self-pay | Admitting: Radiology

## 2013-03-25 ENCOUNTER — Encounter: Payer: Self-pay | Admitting: Family Medicine

## 2013-03-25 ENCOUNTER — Ambulatory Visit (INDEPENDENT_AMBULATORY_CARE_PROVIDER_SITE_OTHER): Payer: Medicare Other | Admitting: Family Medicine

## 2013-03-25 VITALS — BP 112/78 | HR 84 | Temp 98.1°F | Ht <= 58 in | Wt 88.8 lb

## 2013-03-25 DIAGNOSIS — F430 Acute stress reaction: Secondary | ICD-10-CM

## 2013-03-25 DIAGNOSIS — Z0000 Encounter for general adult medical examination without abnormal findings: Secondary | ICD-10-CM | POA: Insufficient documentation

## 2013-03-25 DIAGNOSIS — G2581 Restless legs syndrome: Secondary | ICD-10-CM

## 2013-03-25 DIAGNOSIS — E785 Hyperlipidemia, unspecified: Secondary | ICD-10-CM

## 2013-03-25 DIAGNOSIS — I10 Essential (primary) hypertension: Secondary | ICD-10-CM

## 2013-03-25 DIAGNOSIS — M810 Age-related osteoporosis without current pathological fracture: Secondary | ICD-10-CM

## 2013-03-25 NOTE — Assessment & Plan Note (Signed)
Saw Dr Laymond Purser for counseling and feels better Decided to move before selling her house- big relief Disc getting regular meals in effort for wt gain  Not on buspar  Not depressed Will continue to follow

## 2013-03-25 NOTE — Assessment & Plan Note (Signed)
Doing better on mirapex - saw Dr Arbutus Leas in neurology Is able to sleep Tolerating side eff No dizziness of falls

## 2013-03-25 NOTE — Assessment & Plan Note (Signed)
bp in fair control at this time  No changes needed  Disc lifstyle change with low sodium diet and exercise  Rev last labs

## 2013-03-25 NOTE — Progress Notes (Signed)
Subjective:    Patient ID: Beth Roberts, female    DOB: [birth year], 79 y.o.   MRN: [id number]  HPI I have personally reviewed the Medicare Annual Wellness questionnaire and have noted 1. The patient's medical and social history 2. Their use of alcohol, tobacco or illicit drugs 3. Their current medications and supplements 4. The patient's functional ability including ADL's, fall risks, home safety risks and hearing or visual             impairment. 5. Diet and physical activities 6. Evidence for depression or mood disorders  The patients weight, height, BMI have been recorded in the chart and visual acuity is per eye clinic.  I have made referrals, counseling and provided education to the patient based review of the above and I have provided the pt with a written personalized care plan for preventive services.  She saw Dr Tat for her RLS On mirapex after failing multiple agents -- now after 2 weeks she is finally able to sleep  It does have side effect of headache   Allergies are bad this year- uses allegra   Anxiety- she saw Dr Laymond Purser - recently - and will continue counseling -found it helpful She has a lot of stressors going on - with the move upcoming   See scanned forms.  Routine anticipatory guidance given to patient.  See health maintenance. Flu 9/13 Shingles vaccine 8/11 PNA 2004 vaccine Tetanus 11/12 Colonoscopy was 2007 - it was ok  Breast cancer screening mammogram 4/13  Self breast exam -no lumps or changes  Just had her yearly eye exam  Pap 11/11 had hysterectomy  Falls-none at all   Mood - is nervous at times but not depressed - is seeing counselor/ she stays motivated     bp is stable today  No cp or palpitations or headaches or edema  No side effects to medicines  BP Readings from Last 3 Encounters:  03/25/13 112/78  03/11/13 130/80  03/07/13 126/86       Chemistry      Component Value Date/Time   NA 136 12/06/2012 1454   K 4.2 12/06/2012 1454   CL  100 12/06/2012 1454   CO2 30 12/06/2012 1454   BUN 21 12/06/2012 1454   CREATININE 0.7 12/06/2012 1454      Component Value Date/Time   CALCIUM 9.1 12/06/2012 1454   ALKPHOS 59 04/05/2012 1308   AST 21 04/05/2012 1308   ALT 16 04/05/2012 1308   BILITOT 0.2* 04/05/2012 1308       Hyperlipidemia  Lab Results  Component Value Date   CHOL 227* 02/12/2013   HDL 78.70 02/12/2013   LDLCALC 64 09/11/2011   LDLDIRECT 120.1 02/12/2013   TRIG 76.0 02/12/2013   CHOLHDL 3 02/12/2013    Lab Results  Component Value Date   WBC 5.3 11/25/2012   HGB 12.9 11/25/2012   HCT 38.4 11/25/2012   MCV 95.2 11/25/2012   PLT 347.0 11/25/2012    Advance directive-- has one / living will  Cognitive function addressed- see scanned forms- and if abnormal then additional documentation follows.  No significant problems   Stress reaction - will be moving in closer to daughter - stress but will be happier she knows   PMH and SH reviewed  Meds, vitals, and allergies reviewed.   Wt is stable with bmi of 18- she sometimes has to eat even when not hungry  ROS: See HPI.  Otherwise negative.     Patient Active Problem  List   Diagnosis Date Noted  . Stress reaction 03/07/2013  . Restless legs syndrome (RLS) 11/25/2012  . Staring spell 11/25/2012  . Hoarseness, chronic 09/25/2012  . Other screening mammogram 09/25/2011  . Varicose veins 09/25/2011  . EPISTAXIS 01/20/2011  . HYPOGLYCEMIA, UNSPECIFIED 11/12/2008  . Seasonal and perennial allergic rhinitis 09/25/2007  . HYPERLIPIDEMIA 04/30/2007  . HYPERTENSION 04/30/2007  . COPD 04/30/2007  . GERD 04/30/2007  . OSTEOPOROSIS 04/30/2007   Past Medical History  Diagnosis Date  . GERD (gastroesophageal reflux disease)     hiatal henia  . Hyperlipidemia   . Hypertension   . Osteoporosis   . Allergy     allergic rhinitis  . COPD (chronic obstructive pulmonary disease)     congenital Lung Deformity//Scar tissue on lung( cxr) after surgery  )   Past Surgical History   Procedure Laterality Date  . Appendectomy    . Eye surgery      cataract extraction, bilateral  . Abdominal hysterectomy      partial ; bleeding  . Lung removal, partial  1988    Lung -wedge resection/s/p sequestration (Has scar tissue on cxr now)  . Shoulder surgery  2000-2001    Bilaterial  . Foot ganglion excision    . Fracture surgery  2008    left shoulder   . Fracture left shoulder  2008  . Cystoscopy  1993    bladder diverticulum  . Ct lung screening  12/2004    right basilor ? mass// Lung CT stable 05/2005   History  Substance Use Topics  . Smoking status: Never Smoker   . Smokeless tobacco: Never Used  . Alcohol Use: No   Family History  Problem Relation Age of Onset  . Cancer Mother     breast  . Hypertension Mother   . Stroke Mother   . Heart disease Father     MI  . Hyperlipidemia Brother   . Hypertension Brother    Allergies  Allergen Reactions  . Ace Inhibitors     Voice dysfunction  . Amlodipine     Flushing/ dizziness  . Codeine     REACTION: nausea and vomiting  . Klonopin (Clonazepam)     Dizzy/ sleepless  . Penicillins     REACTION: breaks out  . Requip (Ropinirole Hcl)     Not effective and makes her nervous  . Sulfonamide Derivatives    Current Outpatient Prescriptions on File Prior to Visit  Medication Sig Dispense Refill  . ALPRAZolam (XANAX) 0.25 MG tablet Take 1 tablet (0.25 mg total) by mouth at bedtime as needed for sleep.  15 tablet  0  . Cholecalciferol (VITAMIN D) 2000 UNITS tablet Take 2,000 Units by mouth daily.        . cloNIDine (CATAPRES) 0.1 MG tablet TAKE ONE TABLET EACH AM AND ONE TABLET AT LUNCH  60 tablet  5  . fish oil-omega-3 fatty acids 1000 MG capsule Take 1 g by mouth daily.       Marland Kitchen losartan (COZAAR) 50 MG tablet Take 1 tablet (50 mg total) by mouth daily.  30 tablet  3  . Multiple Vitamins-Minerals (WOMENS MULTIVITAMIN PLUS) TABS Take 1 tablet by mouth daily.        . Potassium 99 MG TABS Take 1 tablet by  mouth daily.        . pramipexole (MIRAPEX) 0.125 MG tablet Take 1 tablet (0.125 mg total) by mouth Nightly.  30 tablet  4  . ranitidine (ZANTAC)  150 MG tablet Take 1 tablet (150 mg total) by mouth 2 (two) times daily.  60 tablet  11  . sennosides-docusate sodium (SENOKOT-S) 8.6-50 MG tablet Take 1 tablet by mouth daily.      . vitamin B-12 (CYANOCOBALAMIN) 1000 MCG tablet Take 1,000 mcg by mouth daily. Take one by mouth at bedtime       . Ascorbic Acid (VITAMIN C) 500 MG tablet Take 500 mg by mouth Nightly.       . Calcium Carb-Cholecalciferol [phone] MG-UNIT CAPS Take 1 capsule by mouth daily.         No current facility-administered medications on file prior to visit.    Review of Systems Review of Systems  Constitutional: Negative for fever, appetite change, fatigue and unexpected weight change.  Eyes: Negative for pain and visual disturbance.  Respiratory: Negative for cough and shortness of breath.   Cardiovascular: Negative for cp or palpitations    Gastrointestinal: Negative for nausea, diarrhea and constipation.  Genitourinary: Negative for urgency and frequency.  Skin: Negative for pallor or rash   Neurological: Negative for weakness, light-headedness, numbness and headaches. pos for RLS Hematological: Negative for adenopathy. Does not bruise/bleed easily.  Psychiatric/Behavioral: Negative for dysphoric mood. The patient is  nervous/anxious.  - but this has improved        Objective:   Physical Exam  Constitutional: She appears well-developed and well-nourished. No distress.  Slim and well appearing   HENT:  Head: Normocephalic and atraumatic.  Right Ear: External ear normal.  Left Ear: External ear normal.  Nose: Nose normal.  Eyes: Conjunctivae and EOM are normal. Pupils are equal, round, and reactive to light. Right eye exhibits no discharge. Left eye exhibits no discharge. No scleral icterus.  Neck: Normal range of motion. Neck supple. No JVD present. Carotid bruit  is not present. No thyromegaly present.  Cardiovascular: Normal rate, regular rhythm, normal heart sounds and intact distal pulses.  Exam reveals no gallop.   Pulmonary/Chest: Effort normal and breath sounds normal. No respiratory distress. She has no wheezes. She exhibits no tenderness.  Abdominal: Soft. Bowel sounds are normal. She exhibits no distension, no abdominal bruit and no mass. There is no tenderness.  Genitourinary: No breast swelling, tenderness, discharge or bleeding.  Breast exam: No mass, nodules, thickening, tenderness, bulging, retraction, inflamation, nipple discharge or skin changes noted.  No axillary or clavicular LA.  Chaperoned exam.    Musculoskeletal: Normal range of motion. She exhibits no edema and no tenderness.  Lymphadenopathy:    She has no cervical adenopathy.  Neurological: She is alert. She has normal reflexes. No cranial nerve deficit. She exhibits normal muscle tone. Coordination normal.  Skin: Skin is warm and dry. No rash noted. No erythema. No pallor.  Psychiatric: She has a normal mood and affect.          Assessment & Plan:

## 2013-03-25 NOTE — Assessment & Plan Note (Signed)
Reviewed health habits including diet and exercise and skin cancer prevention Also reviewed health mt list, fam hx and immunizations  See HPI Labs reviewed  

## 2013-03-25 NOTE — Assessment & Plan Note (Signed)
Disc goals for lipids and reasons to control them Rev labs with pt Rev low sat fat diet in detail   

## 2013-03-25 NOTE — Patient Instructions (Addendum)
Don't forget to schedule your yearly mammogram - it is due now Take care of yourself  Do not skip meals  I'm glad you are feeling better Follow up in about 6 months

## 2013-03-26 ENCOUNTER — Ambulatory Visit: Payer: Medicare Other | Admitting: Psychology

## 2013-04-01 ENCOUNTER — Telehealth: Payer: Self-pay | Admitting: Neurology

## 2013-04-01 ENCOUNTER — Telehealth: Payer: Self-pay

## 2013-04-01 NOTE — Telephone Encounter (Signed)
Pt has lost appt with Dr Laymond Purser; advised pt to call Victorino Dike at [phone]. Pt voiced understanding.

## 2013-04-01 NOTE — Telephone Encounter (Signed)
Picked up a call from the patient who was requesting a refill on her Mirapex 0.125 mg. States she ran out because she has increased her medication to 2 po at HS. I told her I would check with Dr. Arbutus Leas and be sure she was ok with that. She is ok to wait. **Dr. Arbutus Leas per the phone note on 05/12, patient was to stat taking 2 at hs and then call in a few weeks with an update. Please advise refill request. Thanks.

## 2013-04-02 ENCOUNTER — Other Ambulatory Visit: Payer: Self-pay | Admitting: Neurology

## 2013-04-02 MED ORDER — PRAMIPEXOLE DIHYDROCHLORIDE 0.125 MG PO TABS: ORAL_TABLET | ORAL | Status: DC

## 2013-04-02 NOTE — Telephone Encounter (Signed)
E-scribed to the [facility] in [place] as per Dr. Arbutus Leas below. Patient aware.

## 2013-04-02 NOTE — Telephone Encounter (Signed)
You may fill for 0.125 mg - 2 po q hs and give #60 with 5rf

## 2013-04-07 ENCOUNTER — Telehealth: Payer: Self-pay

## 2013-04-07 NOTE — Telephone Encounter (Signed)
Pt notified.  If she continues with HA she will check with her pcp.  She did just have a cpe last week.  She didn't mention she is under a lot of stress due to selling her condo and moving to [place].

## 2013-04-07 NOTE — Telephone Encounter (Signed)
Mirapex causes BP to drop and not elevate.  However, that should be checkout out as that could be something serious and could be cause of headache.  In my experience, mirapex doesn't cause HA either.  She can stop it if she wants, but there will be no other options for the RLS given the number of meds she has been through.

## 2013-04-07 NOTE — Telephone Encounter (Signed)
Pt calling to let us know she has been having a HA everyday since she started taking the mirapex, she takes Tylenol and it goes away, but comes back as soon as the med wears off.  Also, the other night she woke up around 1:30am and her head felt funny so she checked her BP and it was around 200/100 so she took some more of her bp med and it came down.  She is wondering if the mirapex could be causing her HA and her one time bp spike.  The mirapex is helping her legs.  She says she has HA's fairly often, but they have been daily since staring the mirapex.

## 2013-04-21 ENCOUNTER — Encounter: Payer: Self-pay | Admitting: Family Medicine

## 2013-04-29 ENCOUNTER — Ambulatory Visit (INDEPENDENT_AMBULATORY_CARE_PROVIDER_SITE_OTHER): Payer: No Typology Code available for payment source | Admitting: Psychology

## 2013-04-29 DIAGNOSIS — F4322 Adjustment disorder with anxiety: Secondary | ICD-10-CM

## 2013-04-30 ENCOUNTER — Ambulatory Visit: Payer: Self-pay | Admitting: Family Medicine

## 2013-04-30 ENCOUNTER — Encounter: Payer: Self-pay | Admitting: Family Medicine

## 2013-05-01 ENCOUNTER — Encounter: Payer: Self-pay | Admitting: Internal Medicine

## 2013-05-01 ENCOUNTER — Ambulatory Visit (INDEPENDENT_AMBULATORY_CARE_PROVIDER_SITE_OTHER): Payer: Medicare Other | Admitting: Internal Medicine

## 2013-05-01 VITALS — BP 130/80 | HR 99 | Temp 98.1°F | Wt 88.0 lb

## 2013-05-01 DIAGNOSIS — R490 Dysphonia: Secondary | ICD-10-CM

## 2013-05-01 MED ORDER — OMEPRAZOLE 20 MG PO CPDR: 20.0000 mg | DELAYED_RELEASE_CAPSULE | Freq: Every day | ORAL | Status: DC

## 2013-05-01 NOTE — Assessment & Plan Note (Signed)
Ongoing issue No clear infection Most likely reflux related but seems to have allergies and AM drainage  Will ask her to take allegra every night Change to omeprazole at bedtime  May need further changes if this persist

## 2013-05-01 NOTE — Progress Notes (Signed)
Subjective:    Patient ID: Beth Roberts, female    DOB: [birth year], 79 y.o.   MRN: [id number]  HPI 2 nights ago, met with Dr Laymond Purser Felt hoarse then and it worsened Goes back for some time ?allergies per Dr Maple Hudson  Has had an elevated temp-- up 1-2 degrees from her baseline (like 97) No cough Does have drainage in throat---occ heaves and vomits in AM from the drainage Takes allegra most days--helps drainage in day but not at night  Chronic heartburn Takes the zantac every day  Current Outpatient Prescriptions on File Prior to Visit  Medication Sig Dispense Refill  . ALPRAZolam (XANAX) 0.25 MG tablet Take 1 tablet (0.25 mg total) by mouth at bedtime as needed for sleep.  15 tablet  0  . Ascorbic Acid (VITAMIN C) 500 MG tablet Take 500 mg by mouth Nightly.       . Calcium Carb-Cholecalciferol [phone] MG-UNIT CAPS Take 1 capsule by mouth daily.        . Cholecalciferol (VITAMIN D) 2000 UNITS tablet Take 2,000 Units by mouth daily.        . cloNIDine (CATAPRES) 0.1 MG tablet TAKE ONE TABLET EACH AM AND ONE TABLET AT LUNCH  60 tablet  5  . fish oil-omega-3 fatty acids 1000 MG capsule Take 1 g by mouth daily.       . hydrochlorothiazide (HYDRODIURIL) 25 MG tablet Take 12.5 mg by mouth as needed. Take 1/2 pill by mouth once daily      . losartan (COZAAR) 50 MG tablet Take 1 tablet (50 mg total) by mouth daily.  30 tablet  3  . Multiple Vitamins-Minerals (WOMENS MULTIVITAMIN PLUS) TABS Take 1 tablet by mouth daily.        . Potassium 99 MG TABS Take 1 tablet by mouth daily.        . pramipexole (MIRAPEX) 0.125 MG tablet Take 2 by mouth every night.  60 tablet  5  . ranitidine (ZANTAC) 150 MG tablet Take 1 tablet (150 mg total) by mouth 2 (two) times daily.  60 tablet  11  . sennosides-docusate sodium (SENOKOT-S) 8.6-50 MG tablet Take 1 tablet by mouth daily.      . vitamin B-12 (CYANOCOBALAMIN) 1000 MCG tablet Take 1,000 mcg by mouth daily. Take one by mouth at bedtime        No current  facility-administered medications on file prior to visit.    Allergies  Allergen Reactions  . Ace Inhibitors     Voice dysfunction  . Amlodipine     Flushing/ dizziness  . Codeine     REACTION: nausea and vomiting  . Klonopin (Clonazepam)     Dizzy/ sleepless  . Penicillins     REACTION: breaks out  . Requip (Ropinirole Hcl)     Not effective and makes her nervous  . Sulfonamide Derivatives     Past Medical History  Diagnosis Date  . GERD (gastroesophageal reflux disease)     hiatal henia  . Hyperlipidemia   . Hypertension   . Osteoporosis   . Allergy     allergic rhinitis  . COPD (chronic obstructive pulmonary disease)     congenital Lung Deformity//Scar tissue on lung( cxr) after surgery  )    Past Surgical History  Procedure Laterality Date  . Appendectomy    . Eye surgery      cataract extraction, bilateral  . Abdominal hysterectomy      partial ; bleeding  . Lung removal, partial  1988    Lung -wedge resection/s/p sequestration (Has scar tissue on cxr now)  . Shoulder surgery  2000-2001    Bilaterial  . Foot ganglion excision    . Fracture surgery  2008    left shoulder   . Fracture left shoulder  2008  . Cystoscopy  1993    bladder diverticulum  . Ct lung screening  12/2004    right basilor ? mass// Lung CT stable 05/2005    Family History  Problem Relation Age of Onset  . Cancer Mother     breast  . Hypertension Mother   . Stroke Mother   . Heart disease Father     MI  . Hyperlipidemia Brother   . Hypertension Brother     History   Social History  . Marital Status: Widowed    Spouse Name: N/A    Number of Children: N/A  . Years of Education: N/A   Occupational History  . 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 Smoking status: Never Smoker   . Smokeless tobacco: Never Used  . Alcohol Use: No  . Drug Use: No  . Sexually Active: Not on file   Other Topics Concern  . Not on file    Social History Narrative  . No narrative on file   Review of Systems No SOB. Stable DOE Appetite is off Weight is stable    Objective:   Physical Exam  Constitutional: She appears well-developed. No distress.  HENT:  Nose: Nose normal.  Mouth/Throat: Oropharynx is clear and moist. No oropharyngeal exudate.  TMs normal  Neck: Normal range of motion. Neck supple. No thyromegaly present.  Pulmonary/Chest: Effort normal and breath sounds normal. No respiratory distress. She has no wheezes. She has no rales.  Lymphadenopathy:    She has no cervical adenopathy.          Assessment & Plan:

## 2013-05-01 NOTE — Patient Instructions (Signed)
Please stop the zantac and start the omeprazole 20mg  daily at bedtime. Don't eat close to bedtime and try elevating the head of your bed (like putting a brick under the front bedposts) Please take your allegra every night Call Dr Milinda Antis if your hoarseness is not getting any better in the next couple of weeks

## 2013-05-02 ENCOUNTER — Encounter: Payer: Self-pay | Admitting: *Deleted

## 2013-05-21 ENCOUNTER — Ambulatory Visit (INDEPENDENT_AMBULATORY_CARE_PROVIDER_SITE_OTHER): Payer: No Typology Code available for payment source | Admitting: Psychology

## 2013-05-21 DIAGNOSIS — F4322 Adjustment disorder with anxiety: Secondary | ICD-10-CM

## 2013-06-05 ENCOUNTER — Encounter: Payer: Self-pay | Admitting: Neurology

## 2013-06-05 ENCOUNTER — Ambulatory Visit (INDEPENDENT_AMBULATORY_CARE_PROVIDER_SITE_OTHER): Payer: BC Managed Care – PPO | Admitting: Neurology

## 2013-06-05 VITALS — BP 122/72 | HR 96 | Temp 97.5°F | Resp 20 | Wt 85.0 lb

## 2013-06-05 DIAGNOSIS — G2581 Restless legs syndrome: Secondary | ICD-10-CM

## 2013-06-05 MED ORDER — PRAMIPEXOLE DIHYDROCHLORIDE 0.125 MG PO TABS: ORAL_TABLET | ORAL | Status: AC

## 2013-06-05 MED ORDER — LIDOCAINE 5 % EX PTCH: 1.0000 | MEDICATED_PATCH | CUTANEOUS | Status: DC

## 2013-06-05 NOTE — Progress Notes (Signed)
Beth Roberts was seen today in neurologic consultation at the request of Roxy Manns, MD.  The consultation is for the evaluation of RLS and staring spells.  This patient is accompanied in the office by her friend who supplements the history.  I reviewed her med records since last visit.  The patient reports a restlessness in the legs at night.  She wants to move them around.  It is preventing her from sleeping.  She was started on Requip on 11/25/2012.  The patient last visit reported benefit with this medication. However, it caused sleepiness and the medication was changed to clonazepam.  She did not take this long but stated that it caused dizziness.  She tried Neurontin, 100 mg at night.  Again, she'll try this for about a day but she felt panicky.  She is here today to discuss further options. She took xanax last night but slept "too good" and had a hangover effect this AM.    Last visit, her daughter was concerned about some staring spells.  The patient was convinced that she knew everything that was going on around her, but was in he thought.  She did have an MRI of the brain since last visit.  This revealed atrophy and moderate small vessel disease.  Her EEG just revealed some mild slowing.  She refused the ambulatory EEG we sent her for.    Neuroimaging has  previously been performed.  As above, it was done on February, 2014.  There was atrophy and moderate small vessel disease.  06/05/2013 update:  Last visit, I started the patient on Mirapex.  She has failed multiple other medications, although not necessarily give them a fair trial.  She is currently on Mirapex, 0.125 mg, 2 tablets at night for restless leg.  She reports that she is still having problems during the day, after she awakens in the AM.  She thinks that Mirapex causes sleeping problems.  She does admit that she thinks that anxiety contributes; she did sell her place and has spent one night in her new place and that has helped stress.     PREVIOUS MEDICATIONS: Requip, initially helped but then became sleepy; Klonopin, dizzy after one dose; Neurontin, 100 mg-one pill and felt panicky and no help; xanax - too sleepy  ALLERGIES:   Allergies  Allergen Reactions  . Ace Inhibitors     Voice dysfunction  . Amlodipine     Flushing/ dizziness  . Codeine     REACTION: nausea and vomiting  . Klonopin (Clonazepam)     Dizzy/ sleepless  . Penicillins     REACTION: breaks out  . Requip (Ropinirole Hcl)     Not effective and makes her nervous  . Sulfonamide Derivatives     CURRENT MEDICATIONS:  Current Outpatient Prescriptions on File Prior to Visit  Medication Sig Dispense Refill  . ALPRAZolam (XANAX) 0.25 MG tablet Take 1 tablet (0.25 mg total) by mouth at bedtime as needed for sleep.  15 tablet  0  . Ascorbic Acid (VITAMIN C) 500 MG tablet Take 500 mg by mouth Nightly.       . Calcium Carb-Cholecalciferol [phone] MG-UNIT CAPS Take 1 capsule by mouth daily.        . Cholecalciferol (VITAMIN D) 2000 UNITS tablet Take 2,000 Units by mouth daily.        . cloNIDine (CATAPRES) 0.1 MG tablet TAKE ONE TABLET EACH AM AND ONE TABLET AT LUNCH  60 tablet  5  . fexofenadine (ALLEGRA)  180 MG tablet Take 180 mg by mouth at bedtime.      . fish oil-omega-3 fatty acids 1000 MG capsule Take 1 g by mouth daily.       . hydrochlorothiazide (HYDRODIURIL) 25 MG tablet Take 12.5 mg by mouth as needed. Take 1/2 pill by mouth once daily      . losartan (COZAAR) 50 MG tablet Take 1 tablet (50 mg total) by mouth daily.  30 tablet  3  . Multiple Vitamins-Minerals (WOMENS MULTIVITAMIN PLUS) TABS Take 1 tablet by mouth daily.        Marland Kitchen omeprazole (PRILOSEC) 20 MG capsule Take 1 capsule (20 mg total) by mouth at bedtime.  30 capsule  3  . Potassium 99 MG TABS Take 1 tablet by mouth daily.        . pramipexole (MIRAPEX) 0.125 MG tablet Take 2 by mouth every night.  60 tablet  5  . sennosides-docusate sodium (SENOKOT-S) 8.6-50 MG tablet Take 1 tablet by  mouth daily.      . vitamin B-12 (CYANOCOBALAMIN) 1000 MCG tablet Take 1,000 mcg by mouth daily. Take one by mouth at bedtime        No current facility-administered medications on file prior to visit.    PAST MEDICAL HISTORY:   Past Medical History  Diagnosis Date  . GERD (gastroesophageal reflux disease)     hiatal henia  . Hyperlipidemia   . Hypertension   . Osteoporosis   . Allergy     allergic rhinitis  . COPD (chronic obstructive pulmonary disease)     congenital Lung Deformity//Scar tissue on lung( cxr) after surgery  )    PAST SURGICAL HISTORY:   Past Surgical History  Procedure Laterality Date  . Appendectomy    . Eye surgery      cataract extraction, bilateral  . Abdominal hysterectomy      partial ; bleeding  . Lung removal, partial  1988    Lung -wedge resection/s/p sequestration (Has scar tissue on cxr now)  . Shoulder surgery  2000-2001    Bilaterial  . Foot ganglion excision    . Fracture surgery  2008    left shoulder   . Fracture left shoulder  2008  . Cystoscopy  1993    bladder diverticulum  . Ct lung screening  12/2004    right basilor ? mass// Lung CT stable 05/2005    SOCIAL HISTORY:   History   Social History  . Marital Status: Widowed    Spouse Name: N/A    Number of Children: N/A  . Years of Education: N/A   Occupational History  . 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 Smoking status: Never Smoker   . Smokeless tobacco: Never Used  . Alcohol Use: No  . Drug Use: No  . Sexually Active: Not on file   Other Topics Concern  . Not on file   Social History Narrative  . No narrative on file    FAMILY HISTORY:   Family Status  Relation Status Death Age  . Mother Deceased     CVA  . Father Deceased     MI  . Brother Alive     PN  . Child Alive     1, adopted    ROS:  A complete 10 system review of systems was obtained and was unremarkable apart from what is mentioned  above.  PHYSICAL EXAMINATION:  VITALS:   There were no vitals filed for this visit.  GEN:  Normal appears female in no acute distress.  Appears stated age. HEENT:  Normocephalic, atraumatic. The mucous membranes are moist. The superficial temporal arteries are without ropiness or tenderness. Cardiovascular: Regular rate and rhythm. Lungs: Clear to auscultation bilaterally. Neck/Heme: There are no carotid bruits noted bilaterally.  NEUROLOGICAL: Orientation:  The patient is alert and oriented x 3.  Seems somewhat more confused today but answers orientation questions. Cranial nerves: There is good facial symmetry. The pupils are equal round and reactive to light bilaterally. Fundoscopic exam reveals clear disc margins bilaterally. Extraocular muscles are intact and visual fields are full to confrontational testing. Speech is fluent and clear. Soft palate rises symmetrically and there is no tongue deviation. Hearing is intact to conversational tone. Tone: There is some questionable increased tone in the left upper extremity, versus gegenhalten. Sensation: Sensation is intact to light touch and pinprick throughout (facial, trunk, extremities). Vibration is intact at the bilateral big toe. There is no extinction with double simultaneous stimulation. There is no sensory dermatomal level identified. Coordination:  The patient has no difficulty with RAM's or FNF bilaterally. Motor: Strength is 5/5 in the bilateral upper and lower extremities.  Shoulder shrug is equal and symmetric. There is no pronator drift.  There are no fasciculations noted. DTR's: Deep tendon reflexes are 3/4 at the bilateral biceps, triceps, brachioradialis, patella and achilles.  Plantar responses are downgoing bilaterally. Gait and Station: The patient is able to ambulate without difficulty. There is decreased arm swing b/l  Labs:  Lab Results  Component Value Date   WBC 5.3 11/25/2012   HGB 12.9 11/25/2012   HCT 38.4  11/25/2012   MCV 95.2 11/25/2012   PLT 347.0 11/25/2012     Chemistry      Component Value Date/Time   NA 136 12/06/2012 1454   K 4.2 12/06/2012 1454   CL 100 12/06/2012 1454   CO2 30 12/06/2012 1454   BUN 21 12/06/2012 1454   CREATININE 0.7 12/06/2012 1454      Component Value Date/Time   CALCIUM 9.1 12/06/2012 1454   ALKPHOS 59 04/05/2012 1308   AST 21 04/05/2012 1308   ALT 16 04/05/2012 1308   BILITOT 0.2* 04/05/2012 1308     Lab Results  Component Value Date   TSH 1.22 04/05/2012   Lab Results  Component Value Date   IRON 67 03/11/2013   IRON 72 03/11/2013      IMPRESSION/PLAN:  1. transient alteration of awareness.  -Her routine EEG just demonstrated some slowing and she has refused ambulatory EEG.  She has been free of spells since last visit. 2.  restless leg syndrome.  -We will increase the mirapex to 2 tablets in the pm (same as previous) and add 1 in the AM  Risks, benefits, side effects and alternative therapies were discussed.  The opportunity to ask questions was given and they were answered to the best of my ability.  The patient expressed understanding and willingness to follow the outlined treatment protocols.  -She asks for a medication that will help sleep and I reminded her that we have tried this previously (klonopin) and she did not care for that.  I don't think that we should try other meds just for sleep.  -I will add lidoderm patches as needed, 12 hours on, 12 hours off. 3.  She has moved and requests referral to somewhere closer to home.  She will be referred  to [facility].

## 2013-06-05 NOTE — Patient Instructions (Addendum)
1.  Take mirapex as follows:  2 tablets at night,  1 in the AM 2.  Try the lidoderm patches:  You may wear them 12 hours on and 12 hours off 3.  I will get you an appointment with [facility] neurology. 4.  Beth Roberts!

## 2013-06-10 ENCOUNTER — Ambulatory Visit: Payer: Medicare Other | Admitting: Neurology

## 2013-06-11 ENCOUNTER — Telehealth: Payer: Self-pay

## 2013-06-11 NOTE — Telephone Encounter (Signed)
Pt requesting something to help her with sleep.  Her daughter thinks her nerves are getting to her a bit. She can't get relaxed and feels jerky on the inside.  FYI she has her appt with [facility] the end of this month

## 2013-06-12 NOTE — Telephone Encounter (Signed)
Left msg for pt to call to ask about meds and to inform her that Lidocaine patches were denied.

## 2013-06-12 NOTE — Telephone Encounter (Signed)
We discussed this when she was here; we already tried the sleep drug for RLS and she did not like it (klonopin).  The other meds for RLS she has either tried or are not going to help her purely fall asleep.  Also, I don't want to start new med when she is no longer following here.  She has appt with [facility] soon and I think that its best we wait.

## 2013-06-12 NOTE — Telephone Encounter (Signed)
Pt would like to try klonopin again.  She said she doesn't need a months worth, if you would be willing to give her one or two weeks worth.

## 2013-06-12 NOTE — Telephone Encounter (Signed)
What did she do with the remainder of the bottle that she previously had?  She did not try it for long

## 2013-06-13 ENCOUNTER — Other Ambulatory Visit: Payer: Self-pay | Admitting: Family Medicine

## 2013-06-13 MED ORDER — CLONAZEPAM 0.5 MG PO TABS: ORAL_TABLET | ORAL | Status: DC

## 2013-06-13 NOTE — Telephone Encounter (Signed)
Rx sent to [facility] [place] pt notified.

## 2013-06-13 NOTE — Telephone Encounter (Signed)
Klonopin - 0.5 mg - 1/2 to 1 tablet at night before bed.  #30 RF 1

## 2013-06-13 NOTE — Telephone Encounter (Signed)
Pt called to ck on refill losartan; advised refill sen t to [facility] [place]; pt will have pharmacy in [place] to transfer there.

## 2013-06-13 NOTE — Telephone Encounter (Signed)
Pt says she threw out the other klonopin from before so it would be laying around.

## 2013-07-21 ENCOUNTER — Other Ambulatory Visit: Payer: Self-pay | Admitting: Family Medicine

## 2013-08-26 ENCOUNTER — Other Ambulatory Visit: Payer: Self-pay | Admitting: Internal Medicine

## 2013-09-02 ENCOUNTER — Ambulatory Visit (INDEPENDENT_AMBULATORY_CARE_PROVIDER_SITE_OTHER): Payer: Medicare Other

## 2013-09-02 DIAGNOSIS — Z23 Encounter for immunization: Secondary | ICD-10-CM

## 2013-09-07 ENCOUNTER — Other Ambulatory Visit: Payer: Self-pay | Admitting: Family Medicine

## 2013-09-08 ENCOUNTER — Other Ambulatory Visit: Payer: Self-pay | Admitting: Family Medicine

## 2013-09-08 NOTE — Telephone Encounter (Signed)
Electronic refill request, please advise  

## 2013-09-08 NOTE — Telephone Encounter (Signed)
Please schedule PE after 5/20 and refill until then, thanks

## 2013-09-09 MED ORDER — LOSARTAN POTASSIUM 50 MG PO TABS: ORAL_TABLET | ORAL | Status: AC

## 2013-09-09 NOTE — Telephone Encounter (Signed)
Pt is moving to [place] with her daughter and just needs Rx filled until she gets established there, I refilled med but didn't schedule a CPE for 03/25/13 because she will be moved by then, but does have a f/u appt this month with you

## 2013-09-26 ENCOUNTER — Ambulatory Visit: Payer: Self-pay | Admitting: Family Medicine

## 2013-09-30 ENCOUNTER — Telehealth: Payer: Self-pay

## 2013-09-30 ENCOUNTER — Other Ambulatory Visit: Payer: Self-pay | Admitting: Internal Medicine

## 2013-09-30 ENCOUNTER — Other Ambulatory Visit: Payer: Self-pay

## 2013-09-30 NOTE — Telephone Encounter (Signed)
Opened in error

## 2013-09-30 NOTE — Telephone Encounter (Signed)
Pt said omeprazole had been denied earlier today and pt has moved to [facility] and has appt with Doctor in [place] in December and request refill omeprazole to [facility].Please advise.

## 2013-09-30 NOTE — Telephone Encounter (Signed)
Please refill for her until she has her new appt-thanks

## 2013-10-01 MED ORDER — OMEPRAZOLE 20 MG PO CPDR: DELAYED_RELEASE_CAPSULE | ORAL | Status: AC

## 2013-10-01 NOTE — Telephone Encounter (Signed)
done

## 2013-11-07 ENCOUNTER — Other Ambulatory Visit: Payer: Self-pay | Admitting: Family Medicine

## 2013-11-24 ENCOUNTER — Other Ambulatory Visit: Payer: Self-pay | Admitting: *Deleted

## 2013-11-24 NOTE — Telephone Encounter (Signed)
Please deny since she has a new PCP-thanks

## 2013-11-24 NOTE — Telephone Encounter (Signed)
Received faxed refill request from pharmacy. Please confirm that rx should be denied because patient has moved and has a new PCP.

## 2014-01-05 ENCOUNTER — Other Ambulatory Visit: Payer: Self-pay | Admitting: Neurology

## 2014-01-08 ENCOUNTER — Telehealth: Payer: Self-pay | Admitting: Neurology

## 2014-01-08 NOTE — Telephone Encounter (Signed)
Denied.

## 2014-01-08 NOTE — Telephone Encounter (Signed)
RX request for [facility]azepam received [facility] [place]. Request 0.5 mg tablets 1/2 - 1 at bedtime. Patient has not been seen since 05/2013 with no follow up scheduled. Please advise.

## 2014-01-09 NOTE — Telephone Encounter (Signed)
Denial faxed to pharmacy.

## 2014-06-30 ENCOUNTER — Telehealth: Payer: Self-pay | Admitting: Family Medicine

## 2014-06-30 NOTE — Telephone Encounter (Signed)
Pt called wanting you to call her regarding a bill she keeps getting

## 2014-07-01 NOTE — Telephone Encounter (Signed)
Pt is calling in reference to a $40 balance she's being billed for in reference to [facility] for DOS 04/29/2013 in the amt of $20 and 05/21/2013 in the amt of $20.  There was another insurance that should have been in effect, but for some reason it has been termed out. I corrected insurance info and spoke to [employer] and [employer] @ billing. They are going to refile the claims for those 2 DOS. Advised pt of correction and that they are refiling to her 2ndary.

## 2014-10-12 ENCOUNTER — Other Ambulatory Visit: Payer: Self-pay | Admitting: Family Medicine

## 2014-10-12 NOTE — Telephone Encounter (Signed)
Rx declined, pt has moved out of city

## 2015-02-22 ENCOUNTER — Emergency Department
Admit: 2015-02-22 | Disposition: A | Discharge status: Home / Self Care | Payer: Self-pay | Admitting: Emergency Medicine

## 2015-02-22 LAB — CBC
HCT: 34.7 % — ABNORMAL LOW (ref 35.0–47.0)
HGB: 11.4 g/dL — AB (ref 12.0–16.0)
MCH: 30.7 pg (ref 26.0–34.0)
MCHC: 32.7 g/dL (ref 32.0–36.0)
MCV: 94 fL (ref 80–100)
Platelet: 503 10*3/uL — ABNORMAL HIGH (ref 150–440)
RBC: 3.7 10*6/uL — ABNORMAL LOW (ref 3.80–5.20)
RDW: 14.1 % (ref 11.5–14.5)
WBC: 11.7 10*3/uL — ABNORMAL HIGH (ref 3.6–11.0)

## 2015-02-22 LAB — COMPREHENSIVE METABOLIC PANEL
ANION GAP: 9 (ref 7–16)
Albumin: 3.3 g/dL — ABNORMAL LOW
Alkaline Phosphatase: 79 U/L
BUN: 17 mg/dL
Bilirubin,Total: 0.3 mg/dL
Calcium, Total: 9.3 mg/dL
Chloride: 102 mmol/L
Co2: 28 mmol/L
Creatinine: 0.63 mg/dL
EGFR (African American): >60
GLUCOSE: 104 mg/dL — AB
Potassium: 3.6 mmol/L
SGOT(AST): 33 U/L
SGPT (ALT): 19 U/L
Sodium: 139 mmol/L
Total Protein: 6.6 g/dL

## 2015-02-22 LAB — URINALYSIS, COMPLETE
BLOOD: NEGATIVE
Bacteria: NONE SEEN
Bilirubin,UR: NEGATIVE
Glucose,UR: NEGATIVE mg/dL (ref 0–75)
Ketone: NEGATIVE
Leukocyte Esterase: NEGATIVE
NITRITE: NEGATIVE
PH: 8 (ref 4.5–8.0)
Protein: NEGATIVE
Specific Gravity: 1.01 (ref 1.003–1.030)
Squamous Epithelial: NONE SEEN

## 2015-02-22 LAB — TROPONIN I: Troponin-I: 0.05 ng/mL — ABNORMAL HIGH

## 2015-06-28 ENCOUNTER — Emergency Department: Payer: Medicare Other

## 2015-06-28 ENCOUNTER — Other Ambulatory Visit: Payer: Self-pay

## 2015-06-28 ENCOUNTER — Encounter: Payer: Self-pay | Admitting: Radiology

## 2015-06-28 ENCOUNTER — Emergency Department
Admission: EM | Admit: 2015-06-28 | Discharge: 2015-06-28 | Disposition: A | Discharge status: Home / Self Care | Payer: Medicare Other | Attending: Emergency Medicine | Admitting: Emergency Medicine

## 2015-06-28 DIAGNOSIS — I10 Essential (primary) hypertension: Secondary | ICD-10-CM | POA: Insufficient documentation

## 2015-06-28 DIAGNOSIS — R51 Headache: Secondary | ICD-10-CM | POA: Diagnosis not present

## 2015-06-28 DIAGNOSIS — R05 Cough: Secondary | ICD-10-CM | POA: Diagnosis present

## 2015-06-28 DIAGNOSIS — Z8701 Personal history of pneumonia (recurrent): Secondary | ICD-10-CM | POA: Diagnosis not present

## 2015-06-28 DIAGNOSIS — Z79899 Other long term (current) drug therapy: Secondary | ICD-10-CM | POA: Insufficient documentation

## 2015-06-28 DIAGNOSIS — Z880 Allergy status to penicillin: Secondary | ICD-10-CM | POA: Insufficient documentation

## 2015-06-28 DIAGNOSIS — J449 Chronic obstructive pulmonary disease, unspecified: Secondary | ICD-10-CM | POA: Insufficient documentation

## 2015-06-28 DIAGNOSIS — J069 Acute upper respiratory infection, unspecified: Secondary | ICD-10-CM | POA: Insufficient documentation

## 2015-06-28 DIAGNOSIS — R011 Cardiac murmur, unspecified: Secondary | ICD-10-CM

## 2015-06-28 LAB — CBC WITH DIFFERENTIAL/PLATELET
Basophils Absolute: 0.1 10*3/uL (ref 0–0.1)
Basophils Relative: 1 %
Eosinophils Absolute: 0.3 10*3/uL (ref 0–0.7)
Eosinophils Relative: 3 %
HEMATOCRIT: 37.9 % (ref 35.0–47.0)
Hemoglobin: 12.6 g/dL (ref 12.0–16.0)
LYMPHS PCT: 14 %
Lymphs Abs: 1.6 10*3/uL (ref 1.0–3.6)
MCH: 30.7 pg (ref 26.0–34.0)
MCHC: 33.3 g/dL (ref 32.0–36.0)
MCV: 92.1 fL (ref 80.0–100.0)
MONO ABS: 0.7 10*3/uL (ref 0.2–0.9)
MONOS PCT: 7 %
NEUTROS ABS: 8.3 10*3/uL — AB (ref 1.4–6.5)
Neutrophils Relative %: 75 %
Platelets: 351 10*3/uL (ref 150–440)
RBC: 4.12 MIL/uL (ref 3.80–5.20)
RDW: 14.9 % — AB (ref 11.5–14.5)
WBC: 11 10*3/uL (ref 3.6–11.0)

## 2015-06-28 LAB — BASIC METABOLIC PANEL
Anion gap: 10 (ref 5–15)
BUN: 18 mg/dL (ref 6–20)
CO2: 26 mmol/L (ref 22–32)
CREATININE: 0.57 mg/dL (ref 0.44–1.00)
Calcium: 9.1 mg/dL (ref 8.9–10.3)
Chloride: 96 mmol/L — ABNORMAL LOW (ref 101–111)
GFR calc Af Amer: >60 mL/min (ref >60)
GLUCOSE: 88 mg/dL (ref 65–99)
POTASSIUM: 4.2 mmol/L (ref 3.5–5.1)
Sodium: 132 mmol/L — ABNORMAL LOW (ref 135–145)

## 2015-06-28 LAB — TROPONIN I: Troponin I: <0.03 ng/mL (ref <0.031)

## 2015-06-28 MED ORDER — LEVOFLOXACIN 750 MG PO TABS: 750.0000 mg | ORAL_TABLET | Freq: Every day | ORAL | Status: AC

## 2015-06-28 MED ORDER — LEVOFLOXACIN 750 MG PO TABS
750.0000 mg | ORAL_TABLET | Freq: Once | ORAL | Status: AC
  Administered 2015-06-28: 750 mg via ORAL
  Filled 2015-06-28: qty 1

## 2015-06-28 NOTE — Discharge Instructions (Signed)
Heart Murmur Please discussed this murmur with her primary care doctor. He will likely need an echocardiogram to visualize her heart and determine the cause of the murmur. Return immediately for any shortness of breath, or other new or concerning symptoms such as chest pain. A heart murmur is an extra sound heard by your health care provider when listening to your heart with a device called a stethoscope. The sound comes from turbulence when blood flows through the heart and may be a "hum" or "whoosh" sound heard when the heart beats. There are two types of heart murmurs:  Innocent murmurs. Most people with this type of heart murmur do not have a heart problem. Many children have innocent heart murmurs. Your health care provider may suggest some basic testing to know whether your murmur is an innocent murmur. If an innocent heart murmur is found, there is no need for further tests or treatment and no need to restrict activities or stop playing sports.  Abnormal murmurs. These types of murmurs can occur in children and adults. In children, abnormal heart murmurs are typically caused from heart defects that are present at birth (congenital). In adults, abnormal murmurs are usually from heart valve problems caused by disease, infection, or aging. CAUSES  All heart murmurs are a result of an issue with your heart valves. Normally, these valves open to let blood flow through or out of your heart and then shut to keep it from flowing backward. If they do not work properly, you could have:  Regurgitation--When blood leaks back through the valve in the wrong direction.  Mitral valve prolapse--When the mitral valve of the heart has a loose flap and does not close tightly.  Stenosis--When the valve does not open enough and blocks blood flow. SIGNS AND SYMPTOMS  Innocent murmurs do not cause symptoms, and many people with abnormal murmurs may or may not have symptoms. If symptoms do develop, they may  include:  Shortness of breath.  Blue coloring of the skin, especially on the fingertips.  Chest pain.  Palpitations, or feeling a fluttering or skipped heartbeat.  Fainting.  Persistent cough.  Getting tired much faster than expected. DIAGNOSIS  A heart murmur might be heard during a sports physical or during any type of examination. When a murmur is heard, it may suggest a possible problem. When this happens, your health care provider may ask you to see a heart specialist (cardiologist). You may also be asked to have one or more heart tests. In these cases, testing may vary depending on what your health care provider heard. Tests for a heart murmur may include:  Electrocardiogram.  Echocardiogram.  MRI. For children and adults who have an abnormal heart murmur and want to play sports, it is important to complete testing, review test results, and receive recommendations from your health care provider. If heart disease is present, it may not be safe to play. TREATMENT  Innocent murmurs require no treatment or activity restriction. If an abnormal murmur represents a problem with the heart, treatment will depend on the exact nature of the problem. In these cases, medicine or surgery may be needed to treat the problem. HOME CARE INSTRUCTIONS If you want to participate in sports or other types of strenuous physical activity, it is important to discuss this first with your health care provider. If the murmur represents a problem with the heart and you choose to participate in sports, there is a small chance that a serious problem (including sudden death) could  result.  SEEK MEDICAL CARE IF:   You feel that your symptoms are slowly worsening.  You develop any new symptoms that cause concern.  You feel that you are having side effects from any medicines prescribed. SEEK IMMEDIATE MEDICAL CARE IF:   You develop chest pain.  You have shortness of breath.  You notice that your heart  beats irregularly often enough to cause you to worry.  You have fainting spells.  Your symptoms suddenly get worse. Document Released: 11/30/2004 Document Revised: 10/28/2013 Document Reviewed: 06/30/2013 [facility] Patient Information 2015 [employer] [place], [place]. This information is not intended to replace advice given to you by your health care provider. Make sure you discuss any questions you have with your health care provider.

## 2015-06-28 NOTE — ED Provider Notes (Addendum)
[facility] Emergency Department Provider Note  ____________________________________________  Time seen: Approximately 1230 PM  I have reviewed the triage vital signs and the nursing notes.   HISTORY  Chief Complaint Cough    HPI Beth Roberts is a 81 y.o. female with a history of multiple pneumonia as well as COPD with a left lower lobe resection who is presenting today with cough, shortness of breath and low-grade fever overnight. She says that she also has a runny nose and sore throat which has resolved. No nausea, vomiting or diarrhea. No known sick contacts. Patient and daughter who is at the bedside say that she has had multiple pneumonias in the past.   Past Medical History  Diagnosis Date  . GERD (gastroesophageal reflux disease)     hiatal henia  . Hyperlipidemia   . Hypertension   . Osteoporosis   . Allergy     allergic rhinitis  . COPD (chronic obstructive pulmonary disease)     congenital Lung Deformity//Scar tissue on lung( cxr) after surgery  )    Patient Active Problem List   Diagnosis Date Noted  . Encounter for Medicare annual wellness exam 03/25/2013  . Stress reaction 03/07/2013  . Restless legs syndrome (RLS) 11/25/2012  . Staring spell 11/25/2012  . Hoarseness, chronic 09/25/2012  . Other screening mammogram 09/25/2011  . Varicose veins 09/25/2011  . EPISTAXIS 01/20/2011  . HYPOGLYCEMIA, UNSPECIFIED 11/12/2008  . Seasonal and perennial allergic rhinitis 09/25/2007  . HYPERLIPIDEMIA 04/30/2007  . HYPERTENSION 04/30/2007  . COPD 04/30/2007  . GERD 04/30/2007    Past Surgical History  Procedure Laterality Date  . Appendectomy    . Eye surgery      cataract extraction, bilateral  . Abdominal hysterectomy      partial ; bleeding  . Lung removal, partial  1988    Lung -wedge resection/s/p sequestration (Has scar tissue on cxr now)  . Shoulder surgery  2000-2001    Bilaterial  . Foot ganglion excision    . Fracture  surgery  2008    left shoulder   . Fracture left shoulder  2008  . Cystoscopy  1993    bladder diverticulum  . Ct lung screening  12/2004    right basilor ? mass// Lung CT stable 05/2005    Current Outpatient Rx  Name  Route  Sig  Dispense  Refill  . ALPRAZolam (XANAX) 0.25 MG tablet   Oral   Take 1 tablet (0.25 mg total) by mouth at bedtime as needed for sleep.   15 tablet   0   . Ascorbic Acid (VITAMIN C) 500 MG tablet   Oral   Take 500 mg by mouth Nightly.          . Calcium Carb-Cholecalciferol [phone] MG-UNIT CAPS   Oral   Take 1 capsule by mouth daily.           . Cholecalciferol (VITAMIN D) 2000 UNITS tablet   Oral   Take 2,000 Units by mouth daily.           . clonazePAM (KLONOPIN) 0.5 MG tablet      1/2 to one qhs prn   30 tablet   1   . cloNIDine (CATAPRES) 0.1 MG tablet      TAKE ONE TABLET EACH AM AND ONE TABLET AT LUNCH   60 tablet   2   . fexofenadine (ALLEGRA) 180 MG tablet   Oral   Take 180 mg by mouth at bedtime.         Marland Kitchen  fish oil-omega-3 fatty acids 1000 MG capsule   Oral   Take 1 g by mouth daily.          . hydrochlorothiazide (HYDRODIURIL) 25 MG tablet   Oral   Take 12.5 mg by mouth as needed. Take 1/2 pill by mouth once daily         . lidocaine (LIDODERM) 5 %   Transdermal   Place 1 patch onto the skin daily. Remove & Discard patch within 12 hours or as directed by MD   30 patch   5   . losartan (COZAAR) 50 MG tablet      TAKE 1 TABLET BY MOUTH EVERY DAY   30 tablet   2   . Multiple Vitamins-Minerals (WOMENS MULTIVITAMIN PLUS) TABS   Oral   Take 1 tablet by mouth daily.           Marland Kitchen omeprazole (PRILOSEC) 20 MG capsule      TAKE 1 CAPSULE (20 MG TOTAL) BY MOUTH AT BEDTIME.   30 capsule   0   . Potassium 99 MG TABS   Oral   Take 1 tablet by mouth daily.           . pramipexole (MIRAPEX) 0.125 MG tablet      1 in AM, 2 po q hs   90 tablet   5   . sennosides-docusate sodium (SENOKOT-S) 8.6-50 MG  tablet   Oral   Take 1 tablet by mouth daily.         . vitamin B-12 (CYANOCOBALAMIN) 1000 MCG tablet   Oral   Take 1,000 mcg by mouth daily. Take one by mouth at bedtime            Allergies Ace inhibitors; Amlodipine; Codeine; Klonopin; Penicillins; Requip; and Sulfonamide derivatives  Family History  Problem Relation Age of Onset  . Cancer Mother     breast  . Hypertension Mother   . Stroke Mother   . Heart disease Father     MI  . Hyperlipidemia Brother   . Hypertension Brother     Social History Social History  Substance Use Topics  . Smoking status: Never Smoker   . Smokeless tobacco: Never Used  . Alcohol Use: No    Review of Systems Constitutional: As above Eyes: No visual changes. ENT: No sore throat. Cardiovascular: Said had chest pain related to cough last night. No complaints of chest pain at this time. Respiratory: Cough  Gastrointestinal: No abdominal pain.  No nausea, no vomiting.  No diarrhea.  No constipation. Genitourinary: Negative for dysuria. Musculoskeletal: Negative for back pain. Skin: Negative for rash. Neurological: Patient and daughter also complaining of left-sided headaches it radiates from the parieto-occipital region down to the trapezius which is been ongoing for months after a fall. They have seen a neurologist for these headaches but do not currently have a diagnosis. Patient says she also hears a sound similar to a waterfall cascading on her left ear when she turns her head. 10-point ROS otherwise negative.  ____________________________________________   PHYSICAL EXAM:  VITAL SIGNS: ED Triage Vitals  Enc Vitals Group     BP 06/28/15 1217 181/91 mmHg     Pulse Rate 06/28/15 1217 91     Resp 06/28/15 1217 24     Temp 06/28/15 1217 99.1 F (37.3 C)     Temp Source 06/28/15 1217 Oral     SpO2 06/28/15 1217 97 %     Weight 06/28/15 1217 79 lb (  35.834 kg)     Height 06/28/15 1217 4\' 11"  (1.499 m)     Head Cir --       Peak Flow --      Pain Score 06/28/15 1219 8     Pain Loc --      Pain Edu? --      Excl. in GC? --     Constitutional: Alert and oriented. Well appearing and in no acute distress. Eyes: Conjunctivae are normal. PERRL. EOMI. Head: Atraumatic. No tenderness palpation. Normal TMs bilaterally. Nose: Bilateral rhinorrhea. Mouth/Throat: Mucous membranes are moist.  Oropharynx non-erythematous. Neck: No stridor.   Cardiovascular: Normal rate, regular rhythm. 3/6 systolic ejection murmur. Patient and family said they've never been told of a murmur in the past.  Good peripheral circulation. Respiratory: Normal respiratory effort.  No retractions. Crackles that are mild to the right lower lobe.. Gastrointestinal: Soft and nontender. No distention. No abdominal bruits. No CVA tenderness. Musculoskeletal: No lower extremity tenderness nor edema.  No joint effusions. Neurologic:  Normal speech and language. No gross focal neurologic deficits are appreciated. No gait instability. Skin:  Skin is warm, dry and intact. No rash noted. Psychiatric: Mood and affect are normal. Speech and behavior are normal.  ____________________________________________   LABS (all labs ordered are listed, but only abnormal results are displayed)  Labs Reviewed  BASIC METABOLIC PANEL - Abnormal; Notable for the following:    Sodium 132 (*)    Chloride 96 (*)    All other components within normal limits  CBC WITH DIFFERENTIAL/PLATELET - Abnormal; Notable for the following:    RDW 14.9 (*)    Neutro Abs 8.3 (*)    All other components within normal limits  TROPONIN I   ____________________________________________  EKG  ED ECG REPORT I, Arelia Longest, the attending physician, personally viewed and interpreted this ECG.   Date: 06/28/2015  EKG Time: 1348  Rate: 93  Rhythm: normal sinus rhythm  Axis: Normal axis  Intervals:none  ST&T Change: No ST segment elevation or depression. No abnormal T-wave  inversion.  ____________________________________________  RADIOLOGY  No acute disease on the chest x-ray or CT scan of the brain. I personally reviewed the chest x-ray. ____________________________________________   PROCEDURES    ____________________________________________   INITIAL IMPRESSION / ASSESSMENT AND PLAN / ED COURSE  Pertinent labs & imaging results that were available during my care of the patient were reviewed by me and considered in my medical decision making (see chart for details).  ----------------------------------------- 2:17 PM on 06/28/2015 -----------------------------------------  Patient continues to rest comfortably. Reviewed the labs as well as imaging results with the patient and her family. No visible pneumonia on the chest x-ray. However, the patient's daughter says that she has had multiple pneumonia in the past that started as URIs similar to this. The patient was hospitalized one to 2 years ago with pneumonia and discharged with Levaquin which she tolerated well. I will discharge the patient with Levaquin as this may be early stages of pneumonia which is not showing on chest x-ray at this time. This may be the reason for the rales on the exam. However, the rales could also be from chronic scarring. Also discussed the murmur with the patient and her family and they will follow up with her primary care doctor for echocardiogram. The patient denies any chest pain at this time or shortness of breath. Believe that the murmur is a symptomatically at this time and will not further workup in the  emergency department. ____________________________________________   FINAL CLINICAL IMPRESSION(S) / ED DIAGNOSES  Heart murmur. Acute URI. Initial visit.    Myrna Blazer, MD 06/28/15 1419  Unclear cause for headaches.  Family to f/u with neurology who they have already seen for this issue previously.    Myrna Blazer, MD 06/28/15 1425

## 2015-06-28 NOTE — ED Notes (Signed)
Pt presents via EMS From [facility] with c/o chest during the night,  She denies chest pain at this time.  She also has productive cough that developed overnight, and  low grade fever and decreased energy x 2 day

## 2015-07-06 ENCOUNTER — Other Ambulatory Visit: Payer: Self-pay | Admitting: Family Medicine

## 2015-07-06 ENCOUNTER — Ambulatory Visit: Payer: Medicare Other | Admitting: Family Medicine

## 2015-07-06 DIAGNOSIS — R011 Cardiac murmur, unspecified: Secondary | ICD-10-CM

## 2015-07-13 ENCOUNTER — Ambulatory Visit
Admission: RE | Admit: 2015-07-13 | Discharge: 2015-07-13 | Disposition: A | Discharge status: Home / Self Care | Payer: Medicare Other | Source: Ambulatory Visit | Attending: Family Medicine | Admitting: Family Medicine

## 2015-07-13 DIAGNOSIS — R011 Cardiac murmur, unspecified: Secondary | ICD-10-CM | POA: Insufficient documentation

## 2015-07-13 NOTE — Progress Notes (Signed)
*  PRELIMINARY RESULTS* Echocardiogram 2D Echocardiogram has been performed.  Georgann Housekeeper Hege 07/13/2015, 11:18 AM

## 2015-07-18 ENCOUNTER — Observation Stay
Admission: EM | Admit: 2015-07-18 | Discharge: 2015-07-20 | Disposition: A | Discharge status: Skilled Nursing Facility | Payer: Medicare Other | Attending: Internal Medicine | Admitting: Internal Medicine

## 2015-07-18 ENCOUNTER — Encounter: Payer: Self-pay | Admitting: Emergency Medicine

## 2015-07-18 ENCOUNTER — Emergency Department: Payer: Medicare Other

## 2015-07-18 DIAGNOSIS — G20 Parkinson's disease: Secondary | ICD-10-CM | POA: Insufficient documentation

## 2015-07-18 DIAGNOSIS — Z880 Allergy status to penicillin: Secondary | ICD-10-CM | POA: Insufficient documentation

## 2015-07-18 DIAGNOSIS — Z7982 Long term (current) use of aspirin: Secondary | ICD-10-CM | POA: Diagnosis not present

## 2015-07-18 DIAGNOSIS — G9340 Encephalopathy, unspecified: Secondary | ICD-10-CM | POA: Insufficient documentation

## 2015-07-18 DIAGNOSIS — E43 Unspecified severe protein-calorie malnutrition: Principal | ICD-10-CM | POA: Insufficient documentation

## 2015-07-18 DIAGNOSIS — Z882 Allergy status to sulfonamides status: Secondary | ICD-10-CM | POA: Diagnosis not present

## 2015-07-18 DIAGNOSIS — Z8249 Family history of ischemic heart disease and other diseases of the circulatory system: Secondary | ICD-10-CM | POA: Diagnosis not present

## 2015-07-18 DIAGNOSIS — G309 Alzheimer's disease, unspecified: Secondary | ICD-10-CM | POA: Diagnosis not present

## 2015-07-18 DIAGNOSIS — R4182 Altered mental status, unspecified: Secondary | ICD-10-CM | POA: Diagnosis present

## 2015-07-18 DIAGNOSIS — Z8489 Family history of other specified conditions: Secondary | ICD-10-CM | POA: Insufficient documentation

## 2015-07-18 DIAGNOSIS — R159 Full incontinence of feces: Secondary | ICD-10-CM | POA: Insufficient documentation

## 2015-07-18 DIAGNOSIS — F329 Major depressive disorder, single episode, unspecified: Secondary | ICD-10-CM | POA: Insufficient documentation

## 2015-07-18 DIAGNOSIS — Z823 Family history of stroke: Secondary | ICD-10-CM | POA: Diagnosis not present

## 2015-07-18 DIAGNOSIS — J449 Chronic obstructive pulmonary disease, unspecified: Secondary | ICD-10-CM | POA: Diagnosis not present

## 2015-07-18 DIAGNOSIS — I739 Peripheral vascular disease, unspecified: Secondary | ICD-10-CM | POA: Insufficient documentation

## 2015-07-18 DIAGNOSIS — R32 Unspecified urinary incontinence: Secondary | ICD-10-CM | POA: Diagnosis not present

## 2015-07-18 DIAGNOSIS — R410 Disorientation, unspecified: Secondary | ICD-10-CM

## 2015-07-18 DIAGNOSIS — K5900 Constipation, unspecified: Secondary | ICD-10-CM | POA: Insufficient documentation

## 2015-07-18 DIAGNOSIS — J3089 Other allergic rhinitis: Secondary | ICD-10-CM | POA: Diagnosis not present

## 2015-07-18 DIAGNOSIS — R490 Dysphonia: Secondary | ICD-10-CM | POA: Insufficient documentation

## 2015-07-18 DIAGNOSIS — Z9049 Acquired absence of other specified parts of digestive tract: Secondary | ICD-10-CM | POA: Diagnosis not present

## 2015-07-18 DIAGNOSIS — E785 Hyperlipidemia, unspecified: Secondary | ICD-10-CM | POA: Insufficient documentation

## 2015-07-18 DIAGNOSIS — F0390 Unspecified dementia without behavioral disturbance: Secondary | ICD-10-CM | POA: Insufficient documentation

## 2015-07-18 DIAGNOSIS — E860 Dehydration: Secondary | ICD-10-CM | POA: Insufficient documentation

## 2015-07-18 DIAGNOSIS — D649 Anemia, unspecified: Secondary | ICD-10-CM | POA: Diagnosis not present

## 2015-07-18 DIAGNOSIS — Z66 Do not resuscitate: Secondary | ICD-10-CM | POA: Insufficient documentation

## 2015-07-18 DIAGNOSIS — Z885 Allergy status to narcotic agent status: Secondary | ICD-10-CM | POA: Insufficient documentation

## 2015-07-18 DIAGNOSIS — Z803 Family history of malignant neoplasm of breast: Secondary | ICD-10-CM | POA: Insufficient documentation

## 2015-07-18 DIAGNOSIS — F419 Anxiety disorder, unspecified: Secondary | ICD-10-CM | POA: Diagnosis not present

## 2015-07-18 DIAGNOSIS — F0280 Dementia in other diseases classified elsewhere without behavioral disturbance: Secondary | ICD-10-CM | POA: Insufficient documentation

## 2015-07-18 DIAGNOSIS — R05 Cough: Secondary | ICD-10-CM | POA: Diagnosis not present

## 2015-07-18 DIAGNOSIS — Z9889 Other specified postprocedural states: Secondary | ICD-10-CM | POA: Diagnosis not present

## 2015-07-18 DIAGNOSIS — M810 Age-related osteoporosis without current pathological fracture: Secondary | ICD-10-CM | POA: Diagnosis not present

## 2015-07-18 DIAGNOSIS — G2581 Restless legs syndrome: Secondary | ICD-10-CM | POA: Diagnosis not present

## 2015-07-18 DIAGNOSIS — Z9842 Cataract extraction status, left eye: Secondary | ICD-10-CM | POA: Insufficient documentation

## 2015-07-18 DIAGNOSIS — R51 Headache: Secondary | ICD-10-CM | POA: Insufficient documentation

## 2015-07-18 DIAGNOSIS — I10 Essential (primary) hypertension: Secondary | ICD-10-CM | POA: Insufficient documentation

## 2015-07-18 DIAGNOSIS — Z888 Allergy status to other drugs, medicaments and biological substances status: Secondary | ICD-10-CM | POA: Diagnosis not present

## 2015-07-18 DIAGNOSIS — K219 Gastro-esophageal reflux disease without esophagitis: Secondary | ICD-10-CM | POA: Diagnosis not present

## 2015-07-18 DIAGNOSIS — Z9841 Cataract extraction status, right eye: Secondary | ICD-10-CM | POA: Insufficient documentation

## 2015-07-18 DIAGNOSIS — I639 Cerebral infarction, unspecified: Secondary | ICD-10-CM

## 2015-07-18 DIAGNOSIS — F0150 Vascular dementia without behavioral disturbance: Secondary | ICD-10-CM | POA: Insufficient documentation

## 2015-07-18 DIAGNOSIS — M5481 Occipital neuralgia: Secondary | ICD-10-CM | POA: Diagnosis not present

## 2015-07-18 DIAGNOSIS — R627 Adult failure to thrive: Secondary | ICD-10-CM | POA: Insufficient documentation

## 2015-07-18 DIAGNOSIS — Z886 Allergy status to analgesic agent status: Secondary | ICD-10-CM | POA: Insufficient documentation

## 2015-07-18 DIAGNOSIS — R1310 Dysphagia, unspecified: Secondary | ICD-10-CM | POA: Insufficient documentation

## 2015-07-18 LAB — URINALYSIS COMPLETE WITH MICROSCOPIC (ARMC ONLY)
BACTERIA UA: NONE SEEN
Bilirubin Urine: NEGATIVE
Glucose, UA: NEGATIVE mg/dL
HGB URINE DIPSTICK: NEGATIVE
LEUKOCYTES UA: NEGATIVE
Nitrite: NEGATIVE
PH: 6 (ref 5.0–8.0)
PROTEIN: 30 mg/dL — AB
SPECIFIC GRAVITY, URINE: 1.023 (ref 1.005–1.030)

## 2015-07-18 LAB — CBC WITH DIFFERENTIAL/PLATELET
BASOS ABS: 0.1 10*3/uL (ref 0–0.1)
BASOS PCT: 0 %
Eosinophils Absolute: 0.1 10*3/uL (ref 0–0.7)
Eosinophils Relative: 1 %
HEMATOCRIT: 33.5 % — AB (ref 35.0–47.0)
HEMOGLOBIN: 11.3 g/dL — AB (ref 12.0–16.0)
Lymphocytes Relative: 6 %
Lymphs Abs: 1 10*3/uL (ref 1.0–3.6)
MCH: 31.9 pg (ref 26.0–34.0)
MCHC: 33.8 g/dL (ref 32.0–36.0)
MCV: 94.2 fL (ref 80.0–100.0)
MONO ABS: 1.1 10*3/uL — AB (ref 0.2–0.9)
Monocytes Relative: 7 %
NEUTROS ABS: 13.8 10*3/uL — AB (ref 1.4–6.5)
NEUTROS PCT: 86 %
Platelets: 302 10*3/uL (ref 150–440)
RBC: 3.56 MIL/uL — ABNORMAL LOW (ref 3.80–5.20)
RDW: 13.9 % (ref 11.5–14.5)
WBC: 16.1 10*3/uL — ABNORMAL HIGH (ref 3.6–11.0)

## 2015-07-18 LAB — COMPREHENSIVE METABOLIC PANEL
ALT: 10 U/L — ABNORMAL LOW (ref 14–54)
AST: 20 U/L (ref 15–41)
Albumin: 3.7 g/dL (ref 3.5–5.0)
Alkaline Phosphatase: 90 U/L (ref 38–126)
Anion gap: 7 (ref 5–15)
BILIRUBIN TOTAL: 0.9 mg/dL (ref 0.3–1.2)
BUN: 27 mg/dL — AB (ref 6–20)
CO2: 30 mmol/L (ref 22–32)
Calcium: 9.3 mg/dL (ref 8.9–10.3)
Chloride: 97 mmol/L — ABNORMAL LOW (ref 101–111)
Creatinine, Ser: 0.76 mg/dL (ref 0.44–1.00)
GFR calc Af Amer: >60 mL/min (ref >60)
GFR calc non Af Amer: >60 mL/min (ref >60)
GLUCOSE: 114 mg/dL — AB (ref 65–99)
POTASSIUM: 4 mmol/L (ref 3.5–5.1)
Sodium: 134 mmol/L — ABNORMAL LOW (ref 135–145)
TOTAL PROTEIN: 6.9 g/dL (ref 6.5–8.1)

## 2015-07-18 LAB — TROPONIN I: Troponin I: <0.03 ng/mL (ref <0.031)

## 2015-07-18 MED ORDER — POLYETHYL GLYCOL-PROPYL GLYCOL 0.4-0.3 % OP SOLN: 1.0000 [drp] | Freq: Four times a day (QID) | OPHTHALMIC | Status: DC

## 2015-07-18 MED ORDER — ARTIFICIAL TEARS OP OINT
1.0000 "application " | TOPICAL_OINTMENT | Freq: Three times a day (TID) | OPHTHALMIC | Status: DC
  Filled 2015-07-18: qty 3.5

## 2015-07-18 MED ORDER — SODIUM CHLORIDE 0.9 % IV SOLN
INTRAVENOUS | Status: DC
  Administered 2015-07-18 – 2015-07-20 (×3): via INTRAVENOUS

## 2015-07-18 MED ORDER — SODIUM CHLORIDE 0.9 % IV BOLUS (SEPSIS)
1000.0000 mL | Freq: Once | INTRAVENOUS | Status: AC
  Administered 2015-07-18: 1000 mL via INTRAVENOUS

## 2015-07-18 MED ORDER — MEMANTINE HCL ER 28 MG PO CP24
28.0000 mg | ORAL_CAPSULE | Freq: Every day | ORAL | Status: DC
  Administered 2015-07-19: 11:00:00 28 mg via ORAL
  Filled 2015-07-18: qty 1

## 2015-07-18 MED ORDER — MAGNESIUM 250 MG PO TABS: 1.0000 | ORAL_TABLET | Freq: Two times a day (BID) | ORAL | Status: DC

## 2015-07-18 MED ORDER — HEPARIN SODIUM (PORCINE) 5000 UNIT/ML IJ SOLN
5000.0000 [IU] | Freq: Three times a day (TID) | INTRAMUSCULAR | Status: DC
  Administered 2015-07-19 (×2): 5000 [IU] via SUBCUTANEOUS
  Filled 2015-07-18 (×2): qty 1

## 2015-07-18 MED ORDER — SERTRALINE HCL 50 MG PO TABS
50.0000 mg | ORAL_TABLET | Freq: Every day | ORAL | Status: DC
  Administered 2015-07-19: 50 mg via ORAL
  Filled 2015-07-18: qty 1

## 2015-07-18 MED ORDER — PANTOPRAZOLE SODIUM 40 MG PO TBEC
40.0000 mg | DELAYED_RELEASE_TABLET | Freq: Every day | ORAL | Status: DC
  Administered 2015-07-19: 11:00:00 40 mg via ORAL
  Filled 2015-07-18: qty 1

## 2015-07-18 MED ORDER — CARBIDOPA-LEVODOPA 10-100 MG PO TABS
1.0000 | ORAL_TABLET | Freq: Three times a day (TID) | ORAL | Status: DC
  Administered 2015-07-19: 11:00:00 1 via ORAL
  Filled 2015-07-18 (×5): qty 1

## 2015-07-18 MED ORDER — ASPIRIN 81 MG PO CHEW
324.0000 mg | CHEWABLE_TABLET | Freq: Once | ORAL | Status: AC
  Administered 2015-07-18: 324 mg via ORAL
  Filled 2015-07-18: qty 4

## 2015-07-18 MED ORDER — DONEPEZIL HCL 5 MG PO TABS: 10.0000 mg | ORAL_TABLET | Freq: Every day | ORAL | Status: DC

## 2015-07-18 MED ORDER — SENNOSIDES-DOCUSATE SODIUM 8.6-50 MG PO TABS: 1.0000 | ORAL_TABLET | Freq: Every day | ORAL | Status: DC | PRN

## 2015-07-18 MED ORDER — STROKE: EARLY STAGES OF RECOVERY BOOK
Freq: Once | Status: AC
  Administered 2015-07-18

## 2015-07-18 MED ORDER — ASPIRIN EC 81 MG PO TBEC
81.0000 mg | DELAYED_RELEASE_TABLET | Freq: Every day | ORAL | Status: DC
  Administered 2015-07-19: 81 mg via ORAL
  Filled 2015-07-18: qty 1

## 2015-07-18 MED ORDER — LORATADINE 10 MG PO TABS
10.0000 mg | ORAL_TABLET | Freq: Every day | ORAL | Status: DC
  Administered 2015-07-19: 10 mg via ORAL
  Filled 2015-07-18: qty 1

## 2015-07-18 MED ORDER — SENNOSIDES-DOCUSATE SODIUM 8.6-50 MG PO TABS: 1.0000 | ORAL_TABLET | Freq: Every evening | ORAL | Status: DC | PRN

## 2015-07-18 MED ORDER — POLYVINYL ALCOHOL 1.4 % OP SOLN
1.0000 [drp] | Freq: Four times a day (QID) | OPHTHALMIC | Status: DC
  Administered 2015-07-19: 11:00:00 1 [drp] via OPHTHALMIC
  Filled 2015-07-18: qty 15

## 2015-07-18 MED ORDER — CARBOXYMETHYLCELL-HYPROMELLOSE 0.25-0.3 % OP GEL
1.0000 "application " | Freq: Every day | OPHTHALMIC | Status: DC
  Filled 2015-07-18 (×3): qty 1

## 2015-07-18 MED ORDER — MAGNESIUM OXIDE 400 (241.3 MG) MG PO TABS
200.0000 mg | ORAL_TABLET | Freq: Two times a day (BID) | ORAL | Status: DC
  Administered 2015-07-19 – 2015-07-20 (×2): 200 mg via ORAL
  Filled 2015-07-18: qty 1
  Filled 2015-07-18: qty 2
  Filled 2015-07-18: qty 1

## 2015-07-18 MED ORDER — INFLUENZA VAC SPLIT QUAD 0.5 ML IM SUSY: 0.5000 mL | PREFILLED_SYRINGE | INTRAMUSCULAR | Status: DC

## 2015-07-18 NOTE — H&P (Signed)
[facility] - [place] at [facility]   PATIENT NAME: Beth Roberts    MR#:  [id number]  DATE OF BIRTH:  October 14, 1925  DATE OF ADMISSION:  07/18/2015  PRIMARY CARE PHYSICIAN: Beth Baseman, MD   REQUESTING/REFERRING PHYSICIAN: Myrna Blazer, MD  CHIEF COMPLAINT:   Chief Complaint  Patient presents with  . Altered Mental Status   altered mental status today.  HISTORY OF PRESENT ILLNESS:  Beth Roberts  is a 81 y.o. female with a known history of hypertension, hyperlipidemia, COPD and dementia. The patient was sent from nursing home to the ED due to altered mental status today. The patient cannot provide any information. She denies any symptoms. According to patient's daughter, the patient has a baseline dementia or blood was noticed to be very confused today. In addition, the patient has a hoarse voice which is of normal for her. The patient had a pneumonia recently and was treated with the antibiotics. Patient has leukocytosis but no source of infection. The ED physician suspects that the patient may have CVA. The patient was treated with aspirin 1 dose in ED. PAST MEDICAL HISTORY:   Past Medical History  Diagnosis Date  . GERD (gastroesophageal reflux disease)     hiatal henia  . Hyperlipidemia   . Hypertension   . Osteoporosis   . Allergy     allergic rhinitis  . COPD (chronic obstructive pulmonary disease)     congenital Lung Deformity//Scar tissue on lung( cxr) after surgery  )    PAST SURGICAL HISTORY:   Past Surgical History  Procedure Laterality Date  . Appendectomy    . Eye surgery      cataract extraction, bilateral  . Abdominal hysterectomy      partial ; bleeding  . Lung removal, partial  1988    Lung -wedge resection/s/p sequestration (Has scar tissue on cxr now)  . Shoulder surgery  2000-2001    Bilaterial  . Foot ganglion excision    . Fracture surgery  2008    left shoulder   . Fracture left shoulder  2008  . Cystoscopy   1993    bladder diverticulum  . Ct lung screening  12/2004    right basilor ? mass// Lung CT stable 05/2005    SOCIAL HISTORY:   Social History  Substance Use Topics  . Smoking status: Never Smoker   . Smokeless tobacco: Never Used  . Alcohol Use: No    FAMILY HISTORY:   Family History  Problem Relation Age of Onset  . Cancer Mother     breast  . Hypertension Mother   . Stroke Mother   . Heart disease Father     MI  . Hyperlipidemia Brother   . Hypertension Brother     DRUG ALLERGIES:   Allergies  Allergen Reactions  . Ace Inhibitors     Voice dysfunction  . Amlodipine     Flushing/ dizziness  . Codeine     REACTION: nausea and vomiting  . Klonopin [Clonazepam]     Dizzy/ sleepless  . Penicillins     REACTION: breaks out  . Requip [Ropinirole Hcl]     Not effective and makes her nervous  . Sulfonamide Derivatives     REVIEW OF SYSTEMS:  Demented and unable to get a review of system.  MEDICATIONS AT HOME:   Prior to Admission medications   Medication Sig Start Date End Date Taking? Authorizing Provider  acetaminophen (TYLENOL) 500 MG tablet Take 500 mg  by mouth every 4 (four) hours as needed for mild pain, fever or headache.   Yes Historical Provider, MD  aspirin EC 81 MG tablet Take 81 mg by mouth daily.   Yes Historical Provider, MD  carbidopa-levodopa (SINEMET IR) 10-100 MG per tablet Take 1 tablet by mouth 3 (three) times daily.   Yes Historical Provider, MD  Carboxymethylcell-Hypromellose (GENTEAL) 0.25-0.3 % GEL Apply 1 application to eye at bedtime.   Yes Historical Provider, MD  cloNIDine (CATAPRES) 0.1 MG tablet Take 0.1 mg by mouth daily.   Yes Historical Provider, MD  donepezil (ARICEPT) 10 MG tablet Take 10 mg by mouth at bedtime.   Yes Historical Provider, MD  ferrous sulfate 325 (65 FE) MG tablet Take 325 mg by mouth daily with breakfast.   Yes Historical Provider, MD  gabapentin (NEURONTIN) 100 MG capsule Take 100 mg by mouth 2 (two) times  daily.   Yes Historical Provider, MD  hydrochlorothiazide (HYDRODIURIL) 25 MG tablet Take 12.5 mg by mouth as needed.  12/27/12  Yes Judy Pimple, MD  hypromellose (GENTEAL) 0.3 % GEL ophthalmic ointment Place 1 application into both eyes 3 (three) times daily.   Yes Historical Provider, MD  loratadine (CLARITIN) 10 MG tablet Take 10 mg by mouth daily.   Yes Historical Provider, MD  losartan (COZAAR) 50 MG tablet TAKE 1 TABLET BY MOUTH EVERY DAY Patient taking differently: Take 50 mg by mouth daily.  09/09/13  Yes Judy Pimple, MD  Magnesium 250 MG TABS Take 1 tablet by mouth 2 (two) times daily.   Yes Historical Provider, MD  memantine (NAMENDA XR) 28 MG CP24 24 hr capsule Take 28 mg by mouth daily.   Yes Historical Provider, MD  naproxen sodium (ANAPROX) 220 MG tablet Take 220 mg by mouth 2 (two) times daily with a meal.   Yes Historical Provider, MD  omeprazole (PRILOSEC) 20 MG capsule TAKE 1 CAPSULE (20 MG TOTAL) BY MOUTH AT BEDTIME. Patient taking differently: Take 20 mg by mouth at bedtime.  09/30/13  Yes Judy Pimple, MD  ondansetron (ZOFRAN) 4 MG tablet Take 4 mg by mouth every 6 (six) hours as needed for nausea or vomiting.   Yes Historical Provider, MD  Polyethyl Glycol-Propyl Glycol (SYSTANE) 0.4-0.3 % SOLN Apply 1 drop to eye 4 (four) times daily.   Yes Historical Provider, MD  sennosides-docusate sodium (SENOKOT-S) 8.6-50 MG tablet Take 1 tablet by mouth daily as needed for constipation.    Yes Historical Provider, MD  sertraline (ZOLOFT) 50 MG tablet Take 50 mg by mouth daily.   Yes Historical Provider, MD  traMADol (ULTRAM) 50 MG tablet Take 50 mg by mouth 2 (two) times daily as needed for moderate pain.   Yes Historical Provider, MD  ALPRAZolam (XANAX) 0.25 MG tablet Take 1 tablet (0.25 mg total) by mouth at bedtime as needed for sleep. 03/10/13   Judy Pimple, MD  clonazePAM (KLONOPIN) 0.5 MG tablet 1/2 to one qhs prn 06/13/13   Rebecca S Tat, DO  lidocaine (LIDODERM) 5 % Place 1  patch onto the skin daily. Remove & Discard patch within 12 hours or as directed by MD 06/05/13   Octaviano Batty Tat, DO  pramipexole (MIRAPEX) 0.125 MG tablet 1 in AM, 2 po q hs 06/05/13   Octaviano Batty Tat, DO      VITAL SIGNS:  Blood pressure 95/79, pulse 100, temperature 99.3 F (37.4 C), temperature source Oral, resp. rate 14, height 4\' 10"  (1.473 m), weight  37.195 kg (82 lb), SpO2 90 %.  PHYSICAL EXAMINATION:  GENERAL:  81 y.o.-year-old patient lying in the bed with no acute distress.  EYES: Pupils equal, round, reactive to light and accommodation. No scleral icterus. Extraocular muscles intact.  HEENT: Head atraumatic, normocephalic. Oropharynx and nasopharynx clear.  NECK:  Supple, no jugular venous distention. No thyroid enlargement, no tenderness.  LUNGS: Normal breath sounds bilaterally, no wheezing, rales,rhonchi or crepitation. No use of accessory muscles of respiration.  CARDIOVASCULAR: S1, S2 normal. No murmurs, rubs, or gallops.  ABDOMEN: Soft, nontender, nondistended. Bowel sounds present. No organomegaly or mass.  EXTREMITIES: No pedal edema, cyanosis, or clubbing.  NEUROLOGIC: Cranial nerves II through XII are intact. Muscle strength 4/5 in all extremities. Sensation intact. Gait not checked.  PSYCHIATRIC: The patient is alert and oriented x 2.  SKIN: No obvious rash, lesion, or ulcer.   LABORATORY PANEL:   CBC  Recent Labs Lab 07/18/15 1909  WBC 16.1*  HGB 11.3*  HCT 33.5*  PLT 302   ------------------------------------------------------------------------------------------------------------------  Chemistries   Recent Labs Lab 07/18/15 1909  NA 134*  K 4.0  CL 97*  CO2 30  GLUCOSE 114*  BUN 27*  CREATININE 0.76  CALCIUM 9.3  AST 20  ALT 10*  ALKPHOS 90  BILITOT 0.9   ------------------------------------------------------------------------------------------------------------------  Cardiac Enzymes  Recent Labs Lab 07/18/15 1909  TROPONINI <0.03    ------------------------------------------------------------------------------------------------------------------  RADIOLOGY:  Dg Neck Soft Tissue  07/18/2015   CLINICAL DATA:  Cough.  Hoarseness.  Altered mental status.  EXAM: NECK SOFT TISSUES - 1+ VIEW  COMPARISON:  None.  FINDINGS: Soft tissues of the neck are normal. Moderate to severe cervical spondylosis is present. Apparent ankylosis of C4-C5. Between 1 mm and 2 mm anterolisthesis of C 2 on C3 and C3 on C4 which appears degenerative.  IMPRESSION: Normal soft tissue neck.   Electronically Signed   By: Andreas Newport M.D.   On: 07/18/2015 19:15   Dg Chest 1 View  07/18/2015   CLINICAL DATA:  Hoarseness.  Cough.  Altered mental status.  EXAM: CHEST  1 VIEW  COMPARISON:  06/28/2015.  FINDINGS: Cardiopericardial silhouette is upper limits of normal for projection. Chronic tortuosity and calcification of the aortic arch. Configuration of the proximal LEFT humerus suggests an old healed fracture. Chronic scarring at the RIGHT costophrenic angle and RIGHT lung base. The LEFT lung is clear. No airspace disease. No gross hilar enlargement or significant change from prior exams in this patient with boy exchange.  IMPRESSION: No interval change or acute cardiopulmonary disease.   Electronically Signed   By: Andreas Newport M.D.   On: 07/18/2015 19:08   Ct Head Wo Contrast  07/18/2015   CLINICAL DATA:  Code stroke. Altered mental status. Speech disturbance.  EXAM: CT HEAD WITHOUT CONTRAST  TECHNIQUE: Contiguous axial images were obtained from the base of the skull through the vertex without intravenous contrast.  COMPARISON:  06/28/2015.  FINDINGS: No mass lesion, mass effect, midline shift, hydrocephalus, hemorrhage. No acute territorial cortical ischemia/infarct. Atrophy and chronic ischemic white matter disease is present. Benign LEFT basal ganglia calcifications are present. Chronic internal capsule lacunar infarcts. Paranasal sinuses appear normal.   IMPRESSION: 1. Atrophy and chronic ischemic white matter disease without acute intracranial abnormality. 2. Critical Value/emergent results were called by telephone at the time of interpretation on 07/18/2015 at 6:54 pm to Dr. Gladstone Pih , who verbally acknowledged these results.   Electronically Signed   By: Charolette Child.D.  On: 07/18/2015 18:56    EKG:   Orders placed or performed during the hospital encounter of 07/18/15  . ED EKG  . ED EKG    IMPRESSION AND PLAN:   Altered mental status, rule out CVA. Leukocytosis Dehydration Hypertension Hyperlipidemia COPD Dementia  The patient will be admitted to medical floor. I will start CVA protocol, get the MRI of the brain, echocardiogram and carotid duplex. I will hold hypertension medication due to low sacral blood pressure.  Continue aspirin and statin, get physical therapy and speech study. For dehydration, I will start normal saline IV and follow-up BMP. Follow-up CBC, but I will not start antibiotics due to no source of infection. Aspiration and fall precaution.  All the records are reviewed and case discussed with ED provider. Management plans discussed with the patient's daughter and they are in agreement.  CODE STATUS: DO NOT RESUSCITATE  TOTAL TIME TAKING CARE OF THIS PATIENT: 56 minutes.    Shaune Pollack M.D on 07/18/2015 at 10:47 PM  Between 7am to 6pm - Pager - [phone]  After 6pm go to [email or web address] - password [email or web address] [facility]  [facility]  Office  [phone]  CC: Primary care physician; Beth Baseman, MD

## 2015-07-18 NOTE — ED Notes (Signed)
Pt from [facility]. Staff at [facility] states pt at baseline. Family states she is altered. Her voice is very soft & hoarse, which she says is abnormal for her. Pt alert & oriented, occasionally mumbling about someone looking for the office.

## 2015-07-18 NOTE — ED Provider Notes (Signed)
[facility] Emergency Department Provider Note  ____________________________________________  Time seen: Seen upon arrival to the emergency department  I have reviewed the triage vital signs and the nursing notes.   HISTORY  Chief Complaint Altered Mental Status    HPI Beth Roberts is a 81 y.o. female with a history of hypertension and COPD who is presenting today with acutely altered mental status. The family said that they came to see her at about 5 PM today and she was very jittery and not responding to them as she normally would. She also has a very hoarse voice which is abnormal for her. The patient denies any pain right now. She has been having a cough and just finished her second course of antibiotics for pneumonia. She denies any dysuria. Per the EMS the patient was acting at baseline at her residence this morning. It is unclear at this time when exactly the patient became acutely altered. The family thinks that it may have happened about 4:30.   Past Medical History  Diagnosis Date  . GERD (gastroesophageal reflux disease)     hiatal henia  . Hyperlipidemia   . Hypertension   . Osteoporosis   . Allergy     allergic rhinitis  . COPD (chronic obstructive pulmonary disease)     congenital Lung Deformity//Scar tissue on lung( cxr) after surgery  )    Patient Active Problem List   Diagnosis Date Noted  . Encounter for Medicare annual wellness exam 03/25/2013  . Stress reaction 03/07/2013  . Restless legs syndrome (RLS) 11/25/2012  . Staring spell 11/25/2012  . Hoarseness, chronic 09/25/2012  . Other screening mammogram 09/25/2011  . Varicose veins 09/25/2011  . EPISTAXIS 01/20/2011  . HYPOGLYCEMIA, UNSPECIFIED 11/12/2008  . Seasonal and perennial allergic rhinitis 09/25/2007  . HYPERLIPIDEMIA 04/30/2007  . HYPERTENSION 04/30/2007  . COPD 04/30/2007  . GERD 04/30/2007    Past Surgical History  Procedure Laterality Date  . Appendectomy     . Eye surgery      cataract extraction, bilateral  . Abdominal hysterectomy      partial ; bleeding  . Lung removal, partial  1988    Lung -wedge resection/s/p sequestration (Has scar tissue on cxr now)  . Shoulder surgery  2000-2001    Bilaterial  . Foot ganglion excision    . Fracture surgery  2008    left shoulder   . Fracture left shoulder  2008  . Cystoscopy  1993    bladder diverticulum  . Ct lung screening  12/2004    right basilor ? mass// Lung CT stable 05/2005    Current Outpatient Rx  Name  Route  Sig  Dispense  Refill  . ALPRAZolam (XANAX) 0.25 MG tablet   Oral   Take 1 tablet (0.25 mg total) by mouth at bedtime as needed for sleep.   15 tablet   0   . Ascorbic Acid (VITAMIN C) 500 MG tablet   Oral   Take 500 mg by mouth Nightly.          . Calcium Carb-Cholecalciferol [phone] MG-UNIT CAPS   Oral   Take 1 capsule by mouth daily.           . Cholecalciferol (VITAMIN D) 2000 UNITS tablet   Oral   Take 2,000 Units by mouth daily.           . clonazePAM (KLONOPIN) 0.5 MG tablet      1/2 to one qhs prn   30  tablet   1   . cloNIDine (CATAPRES) 0.1 MG tablet      TAKE ONE TABLET EACH AM AND ONE TABLET AT LUNCH   60 tablet   2   . fexofenadine (ALLEGRA) 180 MG tablet   Oral   Take 180 mg by mouth at bedtime.         . fish oil-omega-3 fatty acids 1000 MG capsule   Oral   Take 1 g by mouth daily.          . hydrochlorothiazide (HYDRODIURIL) 25 MG tablet   Oral   Take 12.5 mg by mouth as needed. Take 1/2 pill by mouth once daily         . lidocaine (LIDODERM) 5 %   Transdermal   Place 1 patch onto the skin daily. Remove & Discard patch within 12 hours or as directed by MD   30 patch   5   . losartan (COZAAR) 50 MG tablet      TAKE 1 TABLET BY MOUTH EVERY DAY   30 tablet   2   . Multiple Vitamins-Minerals (WOMENS MULTIVITAMIN PLUS) TABS   Oral   Take 1 tablet by mouth daily.           Marland Kitchen omeprazole (PRILOSEC) 20 MG  capsule      TAKE 1 CAPSULE (20 MG TOTAL) BY MOUTH AT BEDTIME.   30 capsule   0   . Potassium 99 MG TABS   Oral   Take 1 tablet by mouth daily.           . pramipexole (MIRAPEX) 0.125 MG tablet      1 in AM, 2 po q hs   90 tablet   5   . sennosides-docusate sodium (SENOKOT-S) 8.6-50 MG tablet   Oral   Take 1 tablet by mouth daily.         . vitamin B-12 (CYANOCOBALAMIN) 1000 MCG tablet   Oral   Take 1,000 mcg by mouth daily. Take one by mouth at bedtime            Allergies Ace inhibitors; Amlodipine; Codeine; Klonopin; Penicillins; Requip; and Sulfonamide derivatives  Family History  Problem Relation Age of Onset  . Cancer Mother     breast  . Hypertension Mother   . Stroke Mother   . Heart disease Father     MI  . Hyperlipidemia Brother   . Hypertension Brother     Social History Social History  Substance Use Topics  . Smoking status: Never Smoker   . Smokeless tobacco: Never Used  . Alcohol Use: No    Review of Systems Constitutional: No fever/chills Eyes: No visual changes. ENT: No sore throat. Hoarse voice. Cardiovascular: Denies chest pain. Respiratory: Denies shortness of breath. Gastrointestinal: No abdominal pain.  No nausea, no vomiting.  No diarrhea.  No constipation. Genitourinary: Negative for dysuria. Musculoskeletal: Negative for back pain. Skin: Negative for rash. Neurological: Negative for headaches, focal weakness or numbness.  10-point ROS otherwise negative.  ____________________________________________   PHYSICAL EXAM:  VITAL SIGNS: ED Triage Vitals  Enc Vitals Group     BP 07/18/15 1815 109/52 mmHg     Pulse Rate 07/18/15 1815 93     Resp 07/18/15 1815 12     Temp 07/18/15 1815 98.7 F (37.1 C)     Temp Source 07/18/15 1815 Oral     SpO2 07/18/15 1815 93 %     Weight 07/18/15 1815 82 lb (37.195 kg)  Height 07/18/15 1815  (1.473 m)     Head Cir --      Peak Flow --      Pain Score --      Pain Loc  --      Pain Edu? --      Excl. in GC? --     Constitutional: Alert and oriented 3. Well appearing and in no acute distress. Eyes: Conjunctivae are normal. PERRL. EOMI. Head: Atraumatic. Nose: No congestion/rhinnorhea. Mouth/Throat: Mucous membranes are moist.  Oropharynx non-erythematous. Neck: No stridor.   Cardiovascular: Normal rate, regular rhythm. Grossly normal heart sounds.  Good peripheral circulation. Respiratory: Normal respiratory effort.  No retractions. Lungs CTAB. Gastrointestinal: Soft and nontender. No distention. No abdominal bruits. No CVA tenderness. Musculoskeletal: No lower extremity tenderness nor edema.  No joint effusions. Neurologic:  Normal speech and language. No gross focal neurologic deficits are appreciated. The patient is picking at her down and only intermittently following commands appropriately. No longer with any tremors. Skin:  Skin is warm, dry and intact. No rash noted. Psychiatric: Mood and affect are normal. Speech and behavior are normal.  NIH Stroke Scale  Person Administering Scale: Arelia Longest  Administer stroke scale items in the order listed. Record performance in each category after each subscale exam. Do not go back and change scores. Follow directions provided for each exam technique. Scores should reflect what the patient does, not what the clinician thinks the patient can do. The clinician should record answers while administering the exam and work quickly. Except where indicated, the patient should not be coached (i.e., repeated requests to patient to make a special effort).   1a  Level of consciousness: 1=not alert but arousable by minor stimulation to obey, answer or respond  1b. LOC questions:  0=Performs both tasks correctly  1c. LOC commands: 0=Performs both tasks correctly  2.  Best Gaze: 0=normal  3.  Visual: 0=No visual loss  4. Facial Palsy: 0=Normal symmetric movement  5a.  Motor left arm: 0=No drift, limb holds  90 (or 45) degrees for full 10 seconds  5b.  Motor right arm: 0=No drift, limb holds 90 (or 45) degrees for full 10 seconds  6a. motor left leg: 0=No drift, limb holds 90 (or 45) degrees for full 10 seconds  6b  Motor right leg:  0=No drift, limb holds 90 (or 45) degrees for full 10 seconds  7. Limb Ataxia: 0=Absent  8.  Sensory: 0=Normal; no sensory loss  9. Best Language:  0=No aphasia, normal  10. Dysarthria: 0=Normal  11. Extinction and Inattention: 0=No abnormality  12. Distal motor function: 0=Normal   Total:   0    ____________________________________________   LABS (all labs ordered are listed, but only abnormal results are displayed)  Labs Reviewed  CBC WITH DIFFERENTIAL/PLATELET - Abnormal; Notable for the following:    WBC 16.1 (*)    RBC 3.56 (*)    Hemoglobin 11.3 (*)    HCT 33.5 (*)    Neutro Abs 13.8 (*)    Monocytes Absolute 1.1 (*)    All other components within normal limits  COMPREHENSIVE METABOLIC PANEL - Abnormal; Notable for the following:    Sodium 134 (*)    Chloride 97 (*)    Glucose, Bld 114 (*)    BUN 27 (*)    ALT 10 (*)    All other components within normal limits  URINALYSIS COMPLETEWITH MICROSCOPIC ([facility] ONLY) - Abnormal; Notable for the following:  Color, Urine YELLOW (*)    APPearance CLEAR (*)    Ketones, ur TRACE (*)    Protein, ur 30 (*)    Squamous Epithelial / LPF 0-5 (*)    All other components within normal limits  TROPONIN I   ____________________________________________  EKG  ED ECG REPORT I, Dai Apel,  Teena Irani, the attending physician, personally viewed and interpreted this ECG.   Date: 07/18/2015  EKG Time: 2019  Rate: 101  Rhythm: sinus tachycardia  Axis: Normal axis  Intervals:none  ST&T Change: No ST elevations or depressions. T-wave inversion in aVL which is unchanged from 06/28/2015.  ____________________________________________  RADIOLOGY  No acute findings on soft tissue neck, x-ray of the chest or CT  of the brain. ____________________________________________   PROCEDURES   ____________________________________________   INITIAL IMPRESSION / ASSESSMENT AND PLAN / ED COURSE  Pertinent labs & imaging results that were available during my care of the patient were reviewed by me and considered in my medical decision making (see chart for details).  ---------------------------------------- 6:25 PM on 07/18/2015 -----------------------------------------  Daughter now bedside and gives further history of patient being noticed to be altered with tremors at 5 PM.  ----------------------------------------- 650 PM on 07/18/2015 ----------------------------------------- Discussed the case with the patient's nurse, Lola. She says the patient has been acting oddly all day. She says that she was hyperactive during the day wheeling herself up and down her hallway in her wheelchair. Also with hoarse voice all day as well. The nurse says that this change in behavior would've started about 7:30 when the patient awoke. Because of this new information this will put the patient outside the window for TPA.  ----------------------------------------- 9:11 PM on 07/18/2015 -----------------------------------------  Patient still altered per the family. Unclear cause of the patient's altered mental status. Cannot rule out CVA at this time. However, does not have any focal deficits. Keeping CVA on the differential secondary to patient with acute change in mental status. We'll give aspirin. Signed out to Dr. Imogene Burn. Discussed the results as well as the plan of care with the family as well as the patient.  ____________________________________________   FINAL CLINICAL IMPRESSION(S) / ED DIAGNOSES  Acute altered mental status. Initial visit.    Myrna Blazer, MD 07/18/15 2115

## 2015-07-19 ENCOUNTER — Observation Stay
Admit: 2015-07-19 | Discharge: 2015-07-19 | Disposition: A | Discharge status: Home / Self Care | Payer: Medicare Other | Attending: Internal Medicine | Admitting: Internal Medicine

## 2015-07-19 ENCOUNTER — Observation Stay: Payer: Medicare Other

## 2015-07-19 DIAGNOSIS — R4182 Altered mental status, unspecified: Secondary | ICD-10-CM

## 2015-07-19 DIAGNOSIS — R443 Hallucinations, unspecified: Secondary | ICD-10-CM

## 2015-07-19 DIAGNOSIS — Z66 Do not resuscitate: Secondary | ICD-10-CM

## 2015-07-19 DIAGNOSIS — R1310 Dysphagia, unspecified: Secondary | ICD-10-CM | POA: Diagnosis not present

## 2015-07-19 DIAGNOSIS — G620 Drug-induced polyneuropathy: Secondary | ICD-10-CM

## 2015-07-19 DIAGNOSIS — Z8701 Personal history of pneumonia (recurrent): Secondary | ICD-10-CM

## 2015-07-19 DIAGNOSIS — R32 Unspecified urinary incontinence: Secondary | ICD-10-CM

## 2015-07-19 DIAGNOSIS — Z515 Encounter for palliative care: Secondary | ICD-10-CM

## 2015-07-19 DIAGNOSIS — E43 Unspecified severe protein-calorie malnutrition: Secondary | ICD-10-CM | POA: Diagnosis not present

## 2015-07-19 DIAGNOSIS — D649 Anemia, unspecified: Secondary | ICD-10-CM

## 2015-07-19 DIAGNOSIS — R4789 Other speech disturbances: Secondary | ICD-10-CM | POA: Diagnosis not present

## 2015-07-19 DIAGNOSIS — F0280 Dementia in other diseases classified elsewhere without behavioral disturbance: Secondary | ICD-10-CM

## 2015-07-19 LAB — CBC
HCT: 30.9 % — ABNORMAL LOW (ref 35.0–47.0)
HEMOGLOBIN: 10.3 g/dL — AB (ref 12.0–16.0)
MCH: 31.9 pg (ref 26.0–34.0)
MCHC: 33.5 g/dL (ref 32.0–36.0)
MCV: 95.2 fL (ref 80.0–100.0)
PLATELETS: 260 10*3/uL (ref 150–440)
RBC: 3.24 MIL/uL — ABNORMAL LOW (ref 3.80–5.20)
RDW: 13.8 % (ref 11.5–14.5)
WBC: 13.7 10*3/uL — ABNORMAL HIGH (ref 3.6–11.0)

## 2015-07-19 LAB — BASIC METABOLIC PANEL
Anion gap: 8 (ref 5–15)
BUN: 25 mg/dL — AB (ref 6–20)
CHLORIDE: 104 mmol/L (ref 101–111)
CO2: 24 mmol/L (ref 22–32)
CREATININE: 0.67 mg/dL (ref 0.44–1.00)
Calcium: 8.8 mg/dL — ABNORMAL LOW (ref 8.9–10.3)
GFR calc Af Amer: >60 mL/min (ref >60)
GFR calc non Af Amer: >60 mL/min (ref >60)
GLUCOSE: 86 mg/dL (ref 65–99)
POTASSIUM: 3.8 mmol/L (ref 3.5–5.1)
SODIUM: 136 mmol/L (ref 135–145)

## 2015-07-19 LAB — LIPID PANEL
CHOL/HDL RATIO: 2 ratio
CHOLESTEROL: 140 mg/dL (ref 0–200)
HDL: 71 mg/dL (ref >40)
LDL Cholesterol: 54 mg/dL (ref 0–99)
TRIGLYCERIDES: 77 mg/dL (ref <150)
VLDL: 15 mg/dL (ref 0–40)

## 2015-07-19 LAB — SEDIMENTATION RATE: Sed Rate: 66 mm/hr — ABNORMAL HIGH (ref 0–22)

## 2015-07-19 LAB — FOLATE: Folate: 13.6 ng/mL (ref >5.9)

## 2015-07-19 LAB — HEMOGLOBIN A1C: HEMOGLOBIN A1C: 5.4 % (ref 4.0–6.0)

## 2015-07-19 LAB — AMMONIA: AMMONIA: 14 umol/L (ref 9–35)

## 2015-07-19 LAB — MRSA PCR SCREENING: MRSA BY PCR: POSITIVE — AB

## 2015-07-19 LAB — TSH: TSH: 3.128 u[IU]/mL (ref 0.350–4.500)

## 2015-07-19 LAB — CK: Total CK: 197 U/L (ref 38–234)

## 2015-07-19 MED ORDER — ATORVASTATIN CALCIUM 20 MG PO TABS: 40.0000 mg | ORAL_TABLET | Freq: Every day | ORAL | Status: DC

## 2015-07-19 MED ORDER — GABAPENTIN 100 MG PO CAPS: 100.0000 mg | ORAL_CAPSULE | Freq: Two times a day (BID) | ORAL | Status: DC

## 2015-07-19 MED ORDER — PRAMIPEXOLE DIHYDROCHLORIDE 0.125 MG PO TABS
0.1250 mg | ORAL_TABLET | Freq: Every morning | ORAL | Status: DC
  Administered 2015-07-20: 0.125 mg via ORAL
  Filled 2015-07-19: qty 1

## 2015-07-19 MED ORDER — CHLORHEXIDINE GLUCONATE CLOTH 2 % EX PADS
6.0000 | MEDICATED_PAD | Freq: Every day | CUTANEOUS | Status: DC
  Administered 2015-07-19 – 2015-07-20 (×2): 6 via TOPICAL

## 2015-07-19 MED ORDER — CARBIDOPA-LEVODOPA 10-100 MG PO TABS
2.0000 | ORAL_TABLET | Freq: Three times a day (TID) | ORAL | Status: DC
  Administered 2015-07-20: 09:00:00 2 via ORAL
  Filled 2015-07-19: qty 2

## 2015-07-19 MED ORDER — ENSURE ENLIVE PO LIQD: 237.0000 mL | Freq: Three times a day (TID) | ORAL | Status: DC

## 2015-07-19 MED ORDER — ENOXAPARIN SODIUM 30 MG/0.3ML ~~LOC~~ SOLN
30.0000 mg | SUBCUTANEOUS | Status: DC
  Administered 2015-07-19: 30 mg via SUBCUTANEOUS
  Filled 2015-07-19: qty 0.3

## 2015-07-19 MED ORDER — HYPROMELLOSE 0.3 % OP GEL
Freq: Three times a day (TID) | OPHTHALMIC | Status: DC
  Filled 2015-07-19: qty 3.5

## 2015-07-19 MED ORDER — PANTOPRAZOLE SODIUM 40 MG PO PACK: 40.0000 mg | PACK | Freq: Every day | ORAL | Status: DC

## 2015-07-19 MED ORDER — ASPIRIN 81 MG PO CHEW
81.0000 mg | CHEWABLE_TABLET | Freq: Every day | ORAL | Status: DC
  Administered 2015-07-20: 09:00:00 81 mg via ORAL
  Filled 2015-07-19: qty 1

## 2015-07-19 MED ORDER — LORAZEPAM 2 MG/ML IJ SOLN: 1.0000 mg | Freq: Once | INTRAMUSCULAR | Status: DC

## 2015-07-19 MED ORDER — PRAMIPEXOLE DIHYDROCHLORIDE 0.25 MG PO TABS
0.2500 mg | ORAL_TABLET | Freq: Every evening | ORAL | Status: DC
  Filled 2015-07-19 (×2): qty 1

## 2015-07-19 MED ORDER — MUPIROCIN 2 % EX OINT
1.0000 "application " | TOPICAL_OINTMENT | Freq: Two times a day (BID) | CUTANEOUS | Status: DC
  Administered 2015-07-19 (×3): 1 via NASAL
  Filled 2015-07-19 (×2): qty 22

## 2015-07-19 MED ORDER — LORAZEPAM 2 MG/ML IJ SOLN
1.0000 mg | Freq: Once | INTRAMUSCULAR | Status: AC
  Administered 2015-07-19: 1 mg via INTRAVENOUS
  Filled 2015-07-19: qty 1

## 2015-07-19 NOTE — Evaluation (Signed)
Physical Therapy Evaluation Patient Details Name: Beth Roberts MRN: [id number] DOB: Dec 23, 1924 Today's Date: 07/19/2015   History of Present Illness  Pt is an 81 y.o. female (from LTC at [facility]) presenting to hospital with altered mental status.  CT head, chest imaging, and neck soft tissue imaging negative.  MRI pending.  PMH includes HTN, COPD, dementia, partial lung removal, B shoulder surgery, L shoulder fx 2008, hoarse voice, R hip hemi-arthroplasty 12/2014.  Clinical Impression  Currently pt demonstrates impairments with cognition, strength, balance, and limitations with functional mobility.  Prior to admission (per SW), pt was able to transfer to w/c and ambulate with assist using walker.  Pt is a LTC resident at [facility].  Currently pt is max assist supine to/from sit and max assist with sitting balance (pt pushed backwards to lay down a couple seconds after sitting on edge of bed requiring assist for safety).  Pt would benefit from skilled PT to address above noted impairments and functional limitations.  Recommend pt discharge back to LTC facility with ongoing therapy (pending pt's progress/participation) when medically appropriate.     Follow Up Recommendations SNF    Equipment Recommendations       Recommendations for Other Services       Precautions / Restrictions Precautions Precautions: Fall Restrictions Weight Bearing Restrictions: No      Mobility  Bed Mobility Overal bed mobility: Needs Assistance Bed Mobility: Supine to Sit;Sit to Supine     Supine to sit: Max assist;HOB elevated Sit to supine: Max assist;HOB elevated   General bed mobility comments: pt assisted a little with her LE's to sit on edge of bed; pt sat on edge of bed for a couple seconds before pushing back with her trunk to lay back down into bed  Transfers                 General transfer comment: Not appropriate at this time  Ambulation/Gait             General  Gait Details: Not appropriate at this time  Stairs            Wheelchair Mobility    Modified Rankin (Stroke Patients Only)       Balance Overall balance assessment: Needs assistance Sitting-balance support: No upper extremity supported;Feet unsupported Sitting balance-Leahy Scale: Poor Sitting balance - Comments: pt pushed backwards upon sitting on edge of bed requiring assist for safety Postural control: Posterior lean                                   Pertinent Vitals/Pain Pain Assessment: Faces Faces Pain Scale: No hurt  See flowsheet for HR vital.    Home Living Family/patient expects to be discharged to:: Skilled nursing facility (LTC at [facility])                      Prior Function Level of Independence: Needs assistance   Gait / Transfers Assistance Needed: ambulates with walker with assist and transfers to w/c (per SW)           Hand Dominance        Extremity/Trunk Assessment   Upper Extremity Assessment: Generalized weakness (pt did not follow commands to assess strength; B UE ROM appeared [facility])           Lower Extremity Assessment: Generalized weakness (pt did not follow commands to assess LE  strength; B LE ROM WFL except B DF just short of neutral)         Communication   Communication:  (only a couple inaudible words noted during session)  Cognition Arousal/Alertness: Lethargic Behavior During Therapy: Flat affect;Restless Overall Cognitive Status: No family/caregiver present to determine baseline cognitive functioning (Pt with a couple inaudible words during session but otherwise did not talk during session)                      General Comments   Nursing cleared pt for participation in physical therapy.  Pt just returned from imaging.    Exercises        Assessment/Plan    PT Assessment Patient needs continued PT services  PT Diagnosis Generalized weakness   PT Problem List Decreased  strength;Decreased range of motion;Decreased activity tolerance;Decreased balance;Decreased mobility;Decreased cognition  PT Treatment Interventions DME instruction;Gait training;Functional mobility training;Therapeutic activities;Therapeutic exercise;Balance training;Patient/family education   PT Goals (Current goals can be found in the Care Plan section) Acute Rehab PT Goals PT Goal Formulation: Patient unable to participate in goal setting    Frequency Min 2X/week   Barriers to discharge        Co-evaluation               End of Session   Activity Tolerance: Patient limited by lethargy Patient left: in bed;with call bell/phone within reach;with bed alarm set      Functional Assessment Tool Used: AM-PAC without stairs Functional Limitation: Mobility: Walking and moving around Mobility: Walking and Moving Around Current Status (Y8657): At least 80 percent but less than 100 percent impaired, limited or restricted Mobility: Walking and Moving Around Goal Status [phone]): At least 40 percent but less than 60 percent impaired, limited or restricted    Time: 1350-1405 PT Time Calculation (min) (ACUTE ONLY): 15 min   Charges:   PT Evaluation $Initial PT Evaluation Tier I: 1 Procedure     PT G Codes:   PT G-Codes **NOT FOR INPATIENT CLASS** Functional Assessment Tool Used: AM-PAC without stairs Functional Limitation: Mobility: Walking and moving around Mobility: Walking and Moving Around Current Status (E9528): At least 80 percent but less than 100 percent impaired, limited or restricted Mobility: Walking and Moving Around Goal Status [phone]): At least 40 percent but less than 60 percent impaired, limited or restricted    Hendricks Limes 07/19/2015, 3:03 PM Hendricks Limes, PT [phone]

## 2015-07-19 NOTE — Progress Notes (Addendum)
[facility] [place] at [facility]   PATIENT NAME: Beth Roberts    MR#:  [id number]  DATE OF BIRTH:  1924/12/11  SUBJECTIVE:  CHIEF COMPLAINT:   Chief Complaint  Patient presents with  . Altered Mental Status  She is awake, pleasantly confused.  Coughing  Daughter at bedside REVIEW OF SYSTEMS:  Review of Systems  Unable to perform ROS: dementia   DRUG ALLERGIES:   Allergies  Allergen Reactions  . Ace Inhibitors     Voice dysfunction  . Amlodipine     Flushing/ dizziness  . Codeine     REACTION: nausea and vomiting  . Klonopin [Clonazepam]     Dizzy/ sleepless  . Penicillins     REACTION: breaks out  . Requip [Ropinirole Hcl]     Not effective and makes her nervous  . Sulfonamide Derivatives    VITALS:  Blood pressure 140/57, pulse 104, temperature 98 F (36.7 C), temperature source Oral, resp. rate 20, height 4\' 11"  (1.499 m), weight 76 lb 1.6 oz (34.519 kg), SpO2 93 %. PHYSICAL EXAMINATION:  Physical Exam  Constitutional: Vital signs are normal. She appears malnourished and dehydrated. She appears unhealthy. She appears cachectic.  HENT:  Head: Normocephalic and atraumatic.  Eyes: Conjunctivae and EOM are normal. Pupils are equal, round, and reactive to light.  Neck: Normal range of motion. Neck supple. No tracheal deviation present. No thyromegaly present.  Cardiovascular: Normal rate, regular rhythm and normal heart sounds.   Pulmonary/Chest: Effort normal and breath sounds normal. No respiratory distress. She has no wheezes. She exhibits no tenderness.  Abdominal: Soft. Bowel sounds are normal. She exhibits no distension. There is no tenderness.  Musculoskeletal: Normal range of motion.  Neurological: She is alert. No cranial nerve deficit.  She is oriented to person only  Skin: Skin is warm. No rash noted.  Psychiatric: Affect normal. She exhibits a depressed mood. She exhibits disordered thought content.   LABORATORY PANEL:    CBC  Recent Labs Lab 07/19/15 0532  WBC 13.7*  HGB 10.3*  HCT 30.9*  PLT 260   ------------------------------------------------------------------------------------------------------------------ Chemistries   Recent Labs Lab 07/18/15 1909 07/19/15 0532  NA 134* 136  K 4.0 3.8  CL 97* 104  CO2 30 24  GLUCOSE 114* 86  BUN 27* 25*  CREATININE 0.76 0.67  CALCIUM 9.3 8.8*  AST 20  --   ALT 10*  --   ALKPHOS 90  --   BILITOT 0.9  --    RADIOLOGY:  Dg Neck Soft Tissue  07/18/2015   CLINICAL DATA:  Cough.  Hoarseness.  Altered mental status.  EXAM: NECK SOFT TISSUES - 1+ VIEW  COMPARISON:  None.  FINDINGS: Soft tissues of the neck are normal. Moderate to severe cervical spondylosis is present. Apparent ankylosis of C4-C5. Between 1 mm and 2 mm anterolisthesis of C 2 on C3 and C3 on C4 which appears degenerative.  IMPRESSION: Normal soft tissue neck.   Electronically Signed   By: Andreas Newport M.D.   On: 07/18/2015 19:15   Dg Chest 1 View  07/18/2015   CLINICAL DATA:  Hoarseness.  Cough.  Altered mental status.  EXAM: CHEST  1 VIEW  COMPARISON:  06/28/2015.  FINDINGS: Cardiopericardial silhouette is upper limits of normal for projection. Chronic tortuosity and calcification of the aortic arch. Configuration of the proximal LEFT humerus suggests an old healed fracture. Chronic scarring at the RIGHT costophrenic angle and RIGHT lung base. The LEFT lung is clear. No  airspace disease. No gross hilar enlargement or significant change from prior exams in this patient with boy exchange.  IMPRESSION: No interval change or acute cardiopulmonary disease.   Electronically Signed   By: Andreas Newport M.D.   On: 07/18/2015 19:08   Ct Head Wo Contrast  07/18/2015   CLINICAL DATA:  Code stroke. Altered mental status. Speech disturbance.  EXAM: CT HEAD WITHOUT CONTRAST  TECHNIQUE: Contiguous axial images were obtained from the base of the skull through the vertex without intravenous contrast.   COMPARISON:  06/28/2015.  FINDINGS: No mass lesion, mass effect, midline shift, hydrocephalus, hemorrhage. No acute territorial cortical ischemia/infarct. Atrophy and chronic ischemic white matter disease is present. Benign LEFT basal ganglia calcifications are present. Chronic internal capsule lacunar infarcts. Paranasal sinuses appear normal.  IMPRESSION: 1. Atrophy and chronic ischemic white matter disease without acute intracranial abnormality. 2. Critical Value/emergent results were called by telephone at the time of interpretation on 07/18/2015 at 6:54 pm to Dr. Gladstone Pih , who verbally acknowledged these results.   Electronically Signed   By: Andreas Newport M.D.   On: 07/18/2015 18:56   ASSESSMENT AND PLAN:  * Altered mental status, rule out CVA: CT head is neg, await MRI of the brain, echocardiogram and carotid duplex. Continue aspirin and statin, get physical therapy and speech study. Can't r/o seizure at this time. C/s Neuro  * Leukocytosis: doubt infection. cxr & UA neg  * Dehydration: improving with normal saline IV  * Hypertension: hold hypertension medication due to low blood pressure.   * Hyperlipidemia: start Statin  * COPD: stable  * Parkinson's disease: on levodopa carbidopa   * Dementia: Continue Namenda   * Depression: Continue Zoloft  * Chronic severe protein calorie malnutrition: Daughter agrees that she has been having a poor by mouth intake for last several months  * Failure to thrive: Willette Pa, husband died in 46.  Patient was still working until the age of [age] and she moved with her daughter subsequently had a fall in February and was placed at [facility] as she was difficult to manage at home, she has recently had aspiration pneumonia and was treated with antibiotic, which was finished last week.  He continues to have cough and poor by mouth intake   -> strict Aspiration and fall precaution.     All the records are reviewed and case discussed with  Care Management/Social Worker. Management plans discussed with the patient, family and they are in agreement.  CODE STATDO NOT RESUSCITATE  TOTAL TIME TAKING CARE OF THIS PATIENT: 35 minutes.   More than 50% of the time was spent in counseling/coordination of care: YES (had a long discussion with her daughter at bedside and recommended hospice evaluation knowing her continued decline and high risk for recurrent aspiration and failure to thrive, along with severe protein calorie malnutrition)  POSSIBLE D/C IN  1-2 DAYS, DEPENDING ON CLINICAL CONDITION.   [facility], Beth Roberts M.D on 07/19/2015 at 9:32 AM  Between 7am to 6pm - Pager - [phone]  After 6pm go to [email or web address] - password [email or web address]  [facility]  Office  [phone]  CC: Primary care physician; Dorothey Baseman, MD

## 2015-07-19 NOTE — Plan of Care (Addendum)
Problem: Discharge/Transitional Outcomes Goal: Barriers To Progression Addressed/Resolved Outcome: Progressing Pt is from [facility] Hx of GERD, HLD, HTN, COPD, Osteoperosis. Continue home medications when diet continued. High Fall Risk, DNR. Goal: Other Discharge Outcomes/Goals Outcome: Progressing Pt is alert to self, confused. Hoarse, quiet voice, seems to be talking to self at times. Unable to perform adequate NIH scale. Positive for MRSA in the nares, contact precautions initiated. Unable to perform adequate swallow screen, patient unable to answer questions appropriately.

## 2015-07-19 NOTE — Evaluation (Signed)
Clinical/Bedside Swallow Evaluation Patient Details  Name: Beth Roberts MRN: [id number] Date of Birth: [birth year]  Today's Date: 07/19/2015 Time: SLP Start Time (ACUTE ONLY): 1045 SLP Stop Time (ACUTE ONLY): 1145 SLP Time Calculation (min) (ACUTE ONLY): 60 min  Past Medical History:  Past Medical History  Diagnosis Date  . GERD (gastroesophageal reflux disease)     hiatal henia  . Hyperlipidemia   . Hypertension   . Osteoporosis   . Allergy     allergic rhinitis  . COPD (chronic obstructive pulmonary disease)     congenital Lung Deformity//Scar tissue on lung( cxr) after surgery  )   Past Surgical History:  Past Surgical History  Procedure Laterality Date  . Appendectomy    . Eye surgery      cataract extraction, bilateral  . Abdominal hysterectomy      partial ; bleeding  . Lung removal, partial  1988    Lung -wedge resection/s/p sequestration (Has scar tissue on cxr now)  . Shoulder surgery  2000-2001    Bilaterial  . Foot ganglion excision    . Fracture surgery  2008    left shoulder   . Fracture left shoulder  2008  . Cystoscopy  1993    bladder diverticulum  . Ct lung screening  12/2004    right basilor ? mass// Lung CT stable 05/2005   HPI:  Beth Roberts is a 81 y.o. female with a known history of hypertension, hyperlipidemia, COPD and dementia. The patient was sent from nursing home to the ED due to altered mental status on 07/18/15. The patient could not provide any information. She denied any symptoms. According to patient's daughter, the patient has a baseline dementia and was noticed to be very confused just before admission. In addition, the patient has a hoarse voice which is normal for her. The patient had a pneumonia recently and was treated with antibiotics. Patient has leukocytosis but no source of infection. The ED physician suspects that the patient may have CVA. No assymetries or unilaterial weakness observed by ST.    Assessment / Plan /  Recommendation Clinical Impression  No apparant oral pharyngeal phase dysphagia noted at this time.  Pt has a baseline congested cough -- cough did not appear to correlate with PO intake (or vary from baseline presentation cough). Readiness to take POs is hampered by oral awareness confusion due to dementia. Min risk of aspiration due to baseline GERD and dementia.  Pt requires total assist in PO intake.  Pt will open mouth for PO presentation via spoon (due to tactile stim) in patterned fashion; therefore it is important to give all POs slowly.  Small bites and sips.  May require cuing of spoon at lips to recieve POs.    Aspiration Risk  Mild (Based on baseline GERD and cognitive status (dementia))    Diet Recommendation Dysphagia 1 (Puree);Thin   Medication Administration: Crushed with puree (as able) Compensations: Slow rate;Small sips/bites;Minimize environmental distractions (Alternate bites with thin liquids during meal for hydration)    Other  Recommendations Recommended Consults:  (Dietician Consult) Oral Care Recommendations: Oral care BID;Staff/trained caregiver to provide oral care   Follow Up Recommendations       Frequency and Duration min 2x/week  1 week   Pertinent Vitals/Pain Not able to assess based on patient's cognitive status (i.e., baseline dementia)    SLP Swallow Goals  See care plan   Swallow Study Prior Functional Status   Pt resides at a SNF with total  care for all ADLs, reduced oral intake per chart.    General Date of Onset: 07/18/15 Other Pertinent Information: Beth Roberts is a 81 y.o. female with a known history of hypertension, hyperlipidemia, COPD and dementia. The patient was sent from nursing home to the ED due to altered mental status on 07/18/15. The patient could not provide any information. She denied any symptoms. According to patient's daughter, the patient has a baseline dementia and was noticed to be very confused just before admission. In  addition, the patient has a hoarse voice which is normal for her. The patient had a pneumonia recently and was treated with antibiotics. Patient has leukocytosis but no source of infection. The ED physician suspects that the patient may have CVA. No assymetries or unilaterial weakness observed by ST.  Type of Study: Bedside swallow evaluation Diet Prior to this Study: Regular Temperature Spikes Noted: No Respiratory Status: Room air History of Recent Intubation: No Behavior/Cognition: Pleasant mood;Confused;Lethargic/Drowsy;Distractible;Requires cueing Oral Cavity - Dentition: Adequate natural dentition/normal for age Self-Feeding Abilities: Needs set up;Total assist Patient Positioning: Upright in bed Baseline Vocal Quality: Wet;Breathy;Low vocal intensity (wet per daughter (noted in chart, not observed); whisper) Volitional Cough: Wet;Congested (at baseline, not impacted directly by PO intake) Volitional Swallow: Able to elicit (observed)  Mumbled whispering was noted throughout session, with intermittent spontaneous responses (e.g., "thank you")   Oral/Motor/Sensory Function Overall Oral Motor/Sensory Function: Appears within functional limits for tasks assessed Labial ROM: Within Functional Limits (as observed during bolus management) Labial Symmetry: Within Functional Limits Labial Strength:  (Nt) Labial Sensation:  (NT) Lingual ROM: Within Functional Limits (observed as WFL during bolus management) Lingual Symmetry: Within Functional Limits (as observed during bolus management) Lingual Strength:  (NT) Lingual Sensation:  (Nt) Facial ROM:  (NT) Facial Symmetry: Within Functional Limits Facial Strength:  (NT) Facial Sensation:  (NT) Velum:  (NT) Mandible: Within Functional Limits (as observed in bolus management and rotary chewing of ice)   Ice Chips Ice chips: Within functional limits Presentation: Spoon Other Comments: Pt took 4 trials by spoon and chewed ice; no immediate s/s  of aspiration noted   Thin Liquid Thin Liquid: Within functional limits Presentation: Cup;Straw Other Comments: Pt took 4-5 small sips of water from cup and intermittent sips between other PO trials by straw. Pt also took 2-3 multiple sips by straw.  No immediate s/s of aspiration noted across all thin liquid trials of water.    Nectar Thick Nectar Thick Liquid: Not tested   Honey Thick Honey Thick Liquid: Not tested   Puree Puree: Within functional limits Presentation: Spoon Other Comments: Pt took 10+ small bites (1/2 tsp) of sherbert and 10+ small bites (1/4 tsp - 1/2 tsp) of apple sauce.  Delayed cough noted, but not different from baseline.  No immediate/overt s/s of aspiration noted during trials of purees.   Solid   GO    Solid: Not tested       Lekha Dancer 07/19/2015,11:49 AM

## 2015-07-19 NOTE — Progress Notes (Signed)
OT Cancellation Note  Patient Details Name: Beth Roberts MRN: [id number] DOB: Jan 20, 1925   Cancelled Treatment:    Reason Eval/Treat Not Completed: Patient at procedure or test/ unavailable. Nursing finishing assessment.  Ocie Cornfield 07/19/2015, 11:17 AM

## 2015-07-19 NOTE — Progress Notes (Signed)
*  PRELIMINARY RESULTS* Echocardiogram 2D Echocardiogram has been performed.  Georgann Housekeeper Hege 07/19/2015, 3:16 PM

## 2015-07-19 NOTE — Plan of Care (Signed)
Problem: Discharge/Transitional Outcomes Goal: Barriers To Progression Addressed/Resolved Outcome: Not Progressing Plan of Care Progress to Goal:  Pt's daughter brought her in last night b/c she is very far from baseline.  Became more non-responsive throughout the day.  Admitted for possible stroke.  MRI showed no acute infarct but pt moved a great deal during test.  Unable to do Carotid U/S b/c of movement.  Pt would have seizure like movements.  Pt recently admitted for PNA 3 wks ago - just finished abx.  Goal: Educational Plan Complete Outcome: Not Progressing Unable to educate pt at this time - Has dementia and Parkinson's and currently non-responsive.  Goal: Hemodynamically stable Outcome: Progressing VSS Goal: Independent mobility/functioning independent or with min Independent mobility/functioning independently or with minimal assistance  Outcome: Not Progressing Pt unable to work w/PT today - far from baseline at [facility] where she was moving around in wheelchair.  Goal: Tolerating diet/TF at goal rate-PEG if inadequate intake Outcome: Not Progressing Pt currently not eating.  Had speech eval.  Recommends crushing meds in applesauce.

## 2015-07-19 NOTE — Consult Note (Addendum)
Palliative Medicine Inpatient Consult Note   Name: Beth Roberts Date: 07/19/2015 MRN: [id number]  DOB: 12-Mar-1925  Referring Physician: Delfino Lovett, MD  Palliative Care consult requested for this 81 y.o. female for goals of medical therapy in patient with altered mental status.    TODAY'S CONVERSATIONS, EVENTS, AND PLANS : 1.  Pt continues as DNR status. There is a Pink MOST form that came with pt and it states DNR and also NO FEEDING TUBE.   2.  Pt clearly has had a rapid neurologic decline.  Gradual decline followed by rapid decline this week/ last few days.  She had the following course:  ---worked till age 70.  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  There, family found that they had to hire someone to be with the pt during the day for ADLs and oversight. ---later, they found they had to hire round the clock help 'because pt never slept'. She would constantly reach for things in the air and was more confused at night ---pt fxd her right hip and was in the hospital for that and since then, she has steadily declined cognitively and functionally ---pt was diagnosed with Dementia formally this year by neurologist, Dr, Lyda Perone, with [facility] (but her record lists this diagnosis prior to this time as she had s/s of a dementia since she retired 4 yrs ago) ---pt ws diagnosed with PARKINSON"S DISEASE sometime in March -June of this year by Dr. Lyda Perone with [facility].   ---family moved back to [facility] from [place] and placed pt at [facility] for long term care. 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 She was continent but recently has had to  wear briefs. She walked more with walker and assist, but now is more wheelchair bound.  ---yesterday, family found her shaking her feet vigorously and also found her making garbled unintelligible speech.  Daughter does not want her to suffer. She is interested in Hospice but wants to know about various meds that would be given or changed, etc.  Pt would have to meet criteria under neurologic change category to be under the care of Hospice and this is being discussed currently between Hospice Liaison Nurse and pt's daughter.    3.  Pt could have had a small CVA, but MRI does not show one. There was motion artifact.  Also, Dr. Sherryll Burger and I have talked about possible seizures and a neurology consult is being sought. I have made Hospice Nurse and daughter aware of this upcoming consult.     4.  She recently had pneumonia and was treated at the facility with 5 days of Levaquin.  No s/s of recurrence here on our CXR but would monitor cough and WBC.   5  Determination of  Hospice eligibility is pending at this time.    REVIEW OF SYSTEMS:  Patient is not able to provide ROS  SPIRITUAL SUPPORT SYSTEM: Yes  -.  SOCIAL HISTORY:  reports that she has never smoked. She has never used smokeless tobacco. She reports that she does not drink alcohol or use illicit drugs.  LEGAL DOCUMENTS:  Pt has a pink MOST form  brought with her here indicating: DNR No feeding tube Determine other limited interventions/ Abx/ fluids  CODE STATUS: DNR  PAST MEDICAL HISTORY: Past Medical History  Diagnosis Date  . GERD (gastroesophageal reflux disease)     hiatal henia  . Hyperlipidemia   . Hypertension   . Osteoporosis   . Allergy     allergic rhinitis  . COPD (chronic obstructive pulmonary disease)     congenital Lung Deformity//Scar tissue on lung( cxr) after surgery  )    PAST SURGICAL HISTORY:  Past Surgical History  Procedure Laterality Date  . Appendectomy    . Eye surgery      cataract extraction,  bilateral  . Abdominal hysterectomy      partial ; bleeding  . Lung removal, partial  1988    Lung -wedge resection/s/p sequestration (Has scar tissue on cxr now)  . Shoulder surgery  2000-2001    Bilaterial  . Foot ganglion excision    . Fracture surgery  2008    left shoulder   . Fracture left shoulder  2008  . Cystoscopy  1993    bladder diverticulum  . Ct lung screening  12/2004    right basilor ? mass// Lung CT stable 05/2005    ALLERGIES:  is allergic to ace inhibitors; amlodipine; codeine; klonopin; penicillins; requip; and sulfonamide derivatives.  MEDICATIONS:  Current Facility-Administered Medications  Medication Dose Route Frequency Provider Last Rate Last Dose  . 0.9 %  sodium chloride infusion   Intravenous Continuous Shaune Pollack, MD 50 mL/hr at 07/18/15 2348    . aspirin EC tablet 81 mg  81 mg Oral Daily Shaune Pollack, MD   81 mg at 07/19/15 1115  . atorvastatin (LIPITOR) tablet 40 mg  40 mg Oral q1800 Delfino Lovett, MD      . carbidopa-levodopa (SINEMET IR) 10-100 MG per tablet immediate release 1 tablet  1 tablet Oral TID Shaune Pollack, MD   1 tablet at 07/19/15 1115  . Carboxymethylcell-Hypromellose 0.25-0.3 % GEL 1 application  1 application Ophthalmic QHS Shaune Pollack, MD   1 application at 07/19/15 0200  . Chlorhexidine Gluconate Cloth 2 % PADS 6 each  6 each Topical Q0600 Wyatt Haste, MD   6 each at 07/19/15 [phone]  . donepezil (ARICEPT) tablet 10 mg  10 mg Oral QHS Shaune Pollack, MD   10 mg at 07/19/15 0201  . feeding supplement (ENSURE ENLIVE) (ENSURE ENLIVE) liquid 237 mL  237 mL Oral TID WC Vipul Shah, MD      . heparin injection 5,000 Units  5,000 Units Subcutaneous 3 times per day Shaune Pollack, MD   5,000 Units at 07/19/15 0622  . hypromellose (GENTEAL) 0.3 % ophthalmic ointment   Both Eyes TID Delfino Lovett, MD      . Influenza vac split quadrivalent PF (FLUARIX) injection 0.5 mL  0.5 mL Intramuscular Tomorrow-1000 Shaune Pollack, MD      . loratadine (CLARITIN) tablet 10 mg  10 mg Oral  Daily Shaune Pollack, MD   10 mg at 07/19/15 1114  . magnesium oxide (MAG-OX) tablet 200 mg  200 mg Oral BID Shaune Pollack, MD   200 mg at 07/19/15 1115  . memantine (NAMENDA XR) 24 hr capsule 28 mg  28 mg Oral Daily Shaune Pollack, MD   28 mg at 07/19/15 1116  . mupirocin ointment (BACTROBAN) 2 % 1 application  1 application Nasal BID Wyatt Haste, MD   1 application at 07/19/15 0308  . pantoprazole (PROTONIX) EC  tablet 40 mg  40 mg Oral Daily Shaune Pollack, MD   40 mg at 07/19/15 1114  . polyvinyl alcohol (LIQUIFILM TEARS) 1.4 % ophthalmic solution 1 drop  1 drop Both Eyes QID Shaune Pollack, MD   1 drop at 07/19/15 1116  . senna-docusate (Senokot-S) tablet 1 tablet  1 tablet Oral Daily PRN Shaune Pollack, MD      . sertraline (ZOLOFT) tablet 50 mg  50 mg Oral Daily Shaune Pollack, MD   50 mg at 07/19/15 1115    Vital Signs: BP 137/70 mmHg  Pulse 105  Temp(Src) 98.5 F (36.9 C) (Oral)  Resp 16  Ht 4\' 11"  (1.499 m)  Wt 34.519 kg (76 lb 1.6 oz)  BMI 15.36 kg/m2  SpO2 93% Filed Weights   07/18/15 1815 07/18/15 2321  Weight: 37.195 kg (82 lb) 34.519 kg (76 lb 1.6 oz)    Estimated body mass index is 15.36 kg/(m^2) as calculated from the following:   Height as of this encounter: 4\' 11"  (1.499 m).   Weight as of this encounter: 34.519 kg (76 lb 1.6 oz).  PERFORMANCE STATUS (ECOG) : 4 - Bedbound --as of now Previously was in wheelchair (when placed there ) and was mobile in wheelchair. She could also walk occasionally in a walker with 2 person assist.    PHYSICAL EXAM: Lying in bed with unintelligible vocalizations and tremors in arms  Thin with temporal wasting EOMI but does not focus on me OP appears clear Hrt rrr no mgr Lungs cta anteriorly --rare cough Abd soft and nontender with nl BS Ext atrophic musculature   LABS: CBC:    Component Value Date/Time   WBC 13.7* 07/19/2015 0532   WBC 11.7* 02/22/2015 0816   HGB 10.3* 07/19/2015 0532   HGB 11.4* 02/22/2015 0816   HCT 30.9* 07/19/2015 0532   HCT  34.7* 02/22/2015 0816   PLT 260 07/19/2015 0532   PLT 503* 02/22/2015 0816   MCV 95.2 07/19/2015 0532   MCV 94 02/22/2015 0816   NEUTROABS 13.8* 07/18/2015 1909   LYMPHSABS 1.0 07/18/2015 1909   MONOABS 1.1* 07/18/2015 1909   EOSABS 0.1 07/18/2015 1909   BASOSABS 0.1 07/18/2015 1909   Comprehensive Metabolic Panel:    Component Value Date/Time   NA 136 07/19/2015 0532   NA 139 02/22/2015 0816   K 3.8 07/19/2015 0532   K 3.6 02/22/2015 0816   CL 104 07/19/2015 0532   CL 102 02/22/2015 0816   CO2 24 07/19/2015 0532   CO2 28 02/22/2015 0816   BUN 25* 07/19/2015 0532   BUN 17 02/22/2015 0816   CREATININE 0.67 07/19/2015 0532   CREATININE 0.63 02/22/2015 0816   GLUCOSE 86 07/19/2015 0532   GLUCOSE 104* 02/22/2015 0816   CALCIUM 8.8* 07/19/2015 0532   CALCIUM 9.3 02/22/2015 0816   AST 20 07/18/2015 1909   AST 33 02/22/2015 0816   ALT 10* 07/18/2015 1909   ALT 19 02/22/2015 0816   ALKPHOS 90 07/18/2015 1909   ALKPHOS 79 02/22/2015 0816   BILITOT 0.9 07/18/2015 1909   BILITOT 0.3 02/22/2015 0816   PROT 6.9 07/18/2015 1909   PROT 6.6 02/22/2015 0816   ALBUMIN 3.7 07/18/2015 1909   ALBUMIN 3.3* 02/22/2015 0816    TESTS: CT Head wo CM 07/18/15: No mass lesion, mass effect, midline shift, hydrocephalus, hemorrhage. No acute territorial cortical ischemia/infarct. Atrophy and chronic ischemic white matter disease is present. Benign LEFT basal ganglia calcifications are present. Chronic internal capsule lacunar infarcts. Paranasal sinuses  appear normal. = Atrophy and chronic ischemic white matter disease without acute intracranial abnormality.  Echo 07/13/15: - Left ventricle: The cavity size was normal. Wall thickness was increased in a pattern of moderate LVH. Systolic function was vigorous. The estimated ejection fraction was in the range of 65% to 70%. - Aortic valve: Valve area (Vmax): 2.58 cm^2. - Mitral valve: There was mild regurgitation.  Soft Tissue neck  07/18/15: Soft tissues of the neck are normal. Moderate to severe cervical spondylosis is present. Apparent ankylosis of C4-C5. Between 1 mm and 2 mm anterolisthesis of C 2 on C3 and C3 on C4 which appears degenerative.Normal soft tissue neck.  CXR 07/18/15: Cardiopericardial silhouette is upper limits of normal for projection. Chronic tortuosity and calcification of the aortic arch. Configuration of the proximal LEFT humerus suggests an old healed fracture. Chronic scarring at the RIGHT costophrenic angle and RIGHT lung base. The LEFT lung is clear. No airspace disease. No gross hilar enlargement or significant change from prior exams in this patient with boy exchange.No interval change or acute cardiopulmonary disease.  ---------------------------------------------------------------------------------------------------------------- IMPRESSION:  1. Altered mental status and functional status ---with suspicion of possible CVA ---pt has just had an MRI of brain and results are still pending ---UA was negative for signs of UTI 2.  Dysphagia with cough (cough not necessarily correlated with po intake) ---opens mouth for oral intake presented by spoon.  Needs cueing of spoon at lips ---on Dysphagia 1 (pureed) with think liquids (slow rate, small bites/ sips alternating ) 3.  Dehydration 4.  Severe protein calorie malnutrition ---with  5.  Essential HTN 6.  Hyperlipidemia 7.  COPD 8.  Dementia --advanced / advancing ---Has Alzheimer's listed as a diagnosis ---has had lacunar infarcts per CT of her head and likely has a component of vascular dementia ---incontinent of Bowel and bladder ---currently able to vocalize but not making sense or speaking in words.  9.  Restless leg Syndrome  ---Failed requip, klonopin, and gabapentin in 2014 10.  Anxiety disorder 11.  COPD (but chart states she is a 'never smoker') 12.  GERD 13. Osteoporosis 14  History of shoulder surgery 15. H/O lung  surgery 1988 (partial resection) with COPD since this event 16.  R hip fracture March 9th with ORIF 17.  Parkinson's --diagnosed by a Neurologist in June, 2016. ---this is detailed in Dr. Mcarthur Rossetti note dated 05/18/15 18.  Chronic headache and occipital Neuralgia  19.  Anemia --mild of unclear etiology  20.  Constipation 21.  Depression 22.  Seasonal allergies 23.  Hallucinations  24.  Possible EPS --reaching in air and mouth movements    See top of note for plan.  REFERRALS TO BE ORDERED:  Hospice Consult is appropriate   More than 50% of the visit was spent in counseling/coordination of care: Yes  Time Spent: 100 minutes

## 2015-07-19 NOTE — Progress Notes (Signed)
Initial Nutrition Assessment  DOCUMENTATION CODES:   Severe malnutrition in context of chronic illness  INTERVENTION:   Meals and Snacks: Cater to patient preferences; SLP following recommending Pureed foods at this time. Provide encouragement at meal times, as pt a feeder. Medical Food Supplement Therapy: will recommend Ensure Enlive po TID, each supplement provides 350 kcal and 20 grams of protein and Magic cup BID with meals, each supplement provides 290 kcal and 9 grams of protein.   NUTRITION DIAGNOSIS:   Malnutrition related to chronic illness as evidenced by severe depletion of body fat, severe depletion of muscle mass.  GOAL:   Patient will meet greater than or equal to 90% of their needs  MONITOR:    (Energy Intake, Skin, Digestive System, Anthropometrics)  REASON FOR ASSESSMENT:   Consult Assessment of nutrition requirement/status  ASSESSMENT:   Pt admitted with AMS r/o CVA and h/o dementia. Pt nonverbal on visit. SLP evaluation for swallowing.  Past Medical History  Diagnosis Date  . GERD (gastroesophageal reflux disease)     hiatal henia  . Hyperlipidemia   . Hypertension   . Osteoporosis   . Allergy     allergic rhinitis  . COPD (chronic obstructive pulmonary disease)     congenital Lung Deformity//Scar tissue on lung( cxr) after surgery  )    Diet Order:  DIET - DYS 1 Room service appropriate?: Yes; Fluid consistency:: Thin    Current Nutrition: Pt tray in room on visit, had taken bites possibly of cereal.  Food/Nutrition-Related History: Per MD note pt with poor po intake PTA. Unable to clarify further with pt on visit, no family present.   Medications: NS at 39mL/hr, Protonix, Mag-Ox  Electrolyte/Renal Profile and Glucose Profile:   Recent Labs Lab 07/18/15 1909 07/19/15 0532  NA 134* 136  K 4.0 3.8  CL 97* 104  CO2 30 24  BUN 27* 25*  CREATININE 0.76 0.67  CALCIUM 9.3 8.8*  GLUCOSE 114* 86   Protein Profile:  Recent Labs Lab  07/18/15 1909  ALBUMIN 3.7    Gastrointestinal Profile: Last BM:  unknown   Nutrition-Focused Physical Exam Findings: Nutrition-Focused physical exam completed. Findings are severe fat depletion, severe muscle depletion, and no edema.   Weight Change: Pt weight loss of 14% weight loss in 2 years, 4% weight loss in a few weeks   Skin:  Reviewed, no issues   Height:   Ht Readings from Last 1 Encounters:  07/18/15  (1.499 m)    Weight:   Wt Readings from Last 1 Encounters:  07/18/15 76 lb 1.6 oz (34.519 kg)   Wt Readings from Last 10 Encounters:  07/18/15 76 lb 1.6 oz (34.519 kg)  07/19/15 74 lb (33.566 kg)  06/28/15 79 lb (35.834 kg)  06/05/13 85 lb (38.556 kg)  05/01/13 88 lb (39.917 kg)  03/25/13 88 lb 12 oz (40.257 kg)  03/11/13 88 lb (39.917 kg)  03/07/13 89 lb (40.37 kg)  02/12/13 90 lb (40.824 kg)  12/27/12 90 lb 8 oz (41.051 kg)    Ideal Body Weight:   44.5kg  BMI:  Body mass index is 15.36 kg/(m^2).  Estimated Nutritional Needs:   Kcal:  BEE: 780kcals, TEE: (IF 1.1-1.3)(AF 1.2) using IBW of 44.5kg  Protein:  49-58g protein (1.1-1.3g/kg) using IBW of 44.5kg  Fluid:  1113-1377mL of fluid (25-90mL/hr) using IBW of  44.5kg  EDUCATION NEEDS:   No education needs identified at this time   HIGH Care Level  Leda Quail, RD, LDN Pager (  [phone]  

## 2015-07-19 NOTE — Progress Notes (Signed)
Dr. Clint Guy notified that pt pulled the NG tube out most of the way. MD states okay to reevaluate in the morning, pt likely to pull NG tube out again.

## 2015-07-19 NOTE — Consult Note (Signed)
Reason for Consult: altered mental status Referring Physician: Dr. Arsenio Katz is an 81 y.o. female.  HPI: seen at request of Dr. Manuella Ghazi for altered mental status;  81 yo RHD F presents to [facility] due to steady decline in cognition and becoming less interactive per charts.  It appears from chart that this may have been happening over the past few weeks.  She was recently diagnosed with Parkinsons at [facility] and medications have helped per chart.  Since being here, she has been nonverbal and just grimaces to pain.  Past Medical History  Diagnosis Date  . GERD (gastroesophageal reflux disease)     hiatal henia  . Hyperlipidemia   . Hypertension   . Osteoporosis   . Allergy     allergic rhinitis  . COPD (chronic obstructive pulmonary disease)     congenital Lung Deformity//Scar tissue on lung( cxr) after surgery  )    Past Surgical History  Procedure Laterality Date  . Appendectomy    . Eye surgery      cataract extraction, bilateral  . Abdominal hysterectomy      partial ; bleeding  . Lung removal, partial  1988    Lung -wedge resection/s/p sequestration (Has scar tissue on cxr now)  . Shoulder surgery  2000-2001    Bilaterial  . Foot ganglion excision    . Fracture surgery  2008    left shoulder   . Fracture left shoulder  2008  . Cystoscopy  1993    bladder diverticulum  . Ct lung screening  12/2004    right basilor ? mass// Lung CT stable 05/2005    Family History  Problem Relation Age of Onset  . Cancer Mother     breast  . Hypertension Mother   . Stroke Mother   . Heart disease Father     MI  . Hyperlipidemia Brother   . Hypertension Brother     Social History:  reports that she has never smoked. She has never used smokeless tobacco. She reports that she does not drink alcohol or use illicit drugs.  Allergies:  Allergies  Allergen Reactions  . Ace Inhibitors     Voice dysfunction  . Amlodipine     Flushing/ dizziness  . Codeine      REACTION: nausea and vomiting  . Klonopin [Clonazepam]     Dizzy/ sleepless  . Penicillins     REACTION: breaks out  . Requip [Ropinirole Hcl]     Not effective and makes her nervous  . Sulfonamide Derivatives     Medications: personally reviewed by me  Results for orders placed or performed during the hospital encounter of 07/18/15 (from the past 48 hour(s))  CBC with Differential     Status: Abnormal   Collection Time: 07/18/15  7:09 PM  Result Value Ref Range   WBC 16.1 (H) 3.6 - 11.0 K/uL   RBC 3.56 (L) 3.80 - 5.20 MIL/uL   Hemoglobin 11.3 (L) 12.0 - 16.0 g/dL   HCT 33.5 (L) 35.0 - 47.0 %   MCV 94.2 80.0 - 100.0 fL   MCH 31.9 26.0 - 34.0 pg   MCHC 33.8 32.0 - 36.0 g/dL   RDW 13.9 11.5 - 14.5 %   Platelets 302 150 - 440 K/uL   Neutrophils Relative % 86 %   Neutro Abs 13.8 (H) 1.4 - 6.5 K/uL   Lymphocytes Relative 6 %   Lymphs Abs 1.0 1.0 - 3.6 K/uL   Monocytes Relative  7 %   Monocytes Absolute 1.1 (H) 0.2 - 0.9 K/uL   Eosinophils Relative 1 %   Eosinophils Absolute 0.1 0 - 0.7 K/uL   Basophils Relative 0 %   Basophils Absolute 0.1 0 - 0.1 K/uL  Comprehensive metabolic panel     Status: Abnormal   Collection Time: 07/18/15  7:09 PM  Result Value Ref Range   Sodium 134 (L) 135 - 145 mmol/L   Potassium 4.0 3.5 - 5.1 mmol/L   Chloride 97 (L) 101 - 111 mmol/L   CO2 30 22 - 32 mmol/L   Glucose, Bld 114 (H) 65 - 99 mg/dL   BUN 27 (H) 6 - 20 mg/dL   Creatinine, Ser 0.76 0.44 - 1.00 mg/dL   Calcium 9.3 8.9 - 10.3 mg/dL   Total Protein 6.9 6.5 - 8.1 g/dL   Albumin 3.7 3.5 - 5.0 g/dL   AST 20 15 - 41 U/L   ALT 10 (L) 14 - 54 U/L   Alkaline Phosphatase 90 38 - 126 U/L   Total Bilirubin 0.9 0.3 - 1.2 mg/dL   GFR calc non Af Amer >60 >60 mL/min   GFR calc Af Amer >60 >60 mL/min    Comment: (NOTE) The eGFR has been calculated using the CKD EPI equation. This calculation has not been validated in all clinical situations. eGFR's persistently <60 mL/min signify possible  Chronic Kidney Disease.    Anion gap 7 5 - 15  Troponin I     Status: None   Collection Time: 07/18/15  7:09 PM  Result Value Ref Range   Troponin I <0.03 <0.031 ng/mL    Comment:        NO INDICATION OF MYOCARDIAL INJURY.   Urinalysis complete, with microscopic ([facility] only)     Status: Abnormal   Collection Time: 07/18/15  7:09 PM  Result Value Ref Range   Color, Urine YELLOW (A) YELLOW   APPearance CLEAR (A) CLEAR   Glucose, UA NEGATIVE NEGATIVE mg/dL   Bilirubin Urine NEGATIVE NEGATIVE   Ketones, ur TRACE (A) NEGATIVE mg/dL   Specific Gravity, Urine 1.023 1.005 - 1.030   Hgb urine dipstick NEGATIVE NEGATIVE   pH 6.0 5.0 - 8.0   Protein, ur 30 (A) NEGATIVE mg/dL   Nitrite NEGATIVE NEGATIVE   Leukocytes, UA NEGATIVE NEGATIVE   RBC / HPF 0-5 0 - 5 RBC/hpf   WBC, UA 0-5 0 - 5 WBC/hpf   Bacteria, UA NONE SEEN NONE SEEN   Squamous Epithelial / LPF 0-5 (A) NONE SEEN   Mucous PRESENT    Hyaline Casts, UA PRESENT   MRSA PCR Screening     Status: Abnormal   Collection Time: 07/19/15 12:03 AM  Result Value Ref Range   MRSA by PCR POSITIVE (A) NEGATIVE    Comment:        The GeneXpert MRSA Assay (FDA approved for NASAL specimens only), is one component of a comprehensive MRSA colonization surveillance program. It is not intended to diagnose MRSA infection nor to guide or monitor treatment for MRSA infections. CRITICAL RESULT CALLED TO, READ BACK BY AND VERIFIED WITH: KRISTINA [facility] 07/19/2015 0127 LKH   Hemoglobin A1c     Status: None   Collection Time: 07/19/15  5:32 AM  Result Value Ref Range   Hgb A1c MFr Bld 5.4 4.0 - 6.0 %  Lipid panel     Status: None   Collection Time: 07/19/15  5:32 AM  Result Value Ref Range   Cholesterol 140  0 - 200 mg/dL   Triglycerides 77 <150 mg/dL   HDL 71 >40 mg/dL   Total CHOL/HDL Ratio 2.0 RATIO   VLDL 15 0 - 40 mg/dL   LDL Cholesterol 54 0 - 99 mg/dL    Comment:        Total Cholesterol/HDL:CHD Risk Coronary Heart Disease Risk  Table                     Men   Women  1/2 Average Risk   3.4   3.3  Average Risk       5.0   4.4  2 X Average Risk   9.6   7.1  3 X Average Risk  23.4   11.0        Use the calculated Patient Ratio above and the CHD Risk Table to determine the patient's CHD Risk.        ATP III CLASSIFICATION (LDL):  <100     mg/dL   Optimal  100-129  mg/dL   Near or Above                    Optimal  130-159  mg/dL   Borderline  160-189  mg/dL   High  >190     mg/dL   Very High   CBC     Status: Abnormal   Collection Time: 07/19/15  5:32 AM  Result Value Ref Range   WBC 13.7 (H) 3.6 - 11.0 K/uL   RBC 3.24 (L) 3.80 - 5.20 MIL/uL   Hemoglobin 10.3 (L) 12.0 - 16.0 g/dL   HCT 30.9 (L) 35.0 - 47.0 %   MCV 95.2 80.0 - 100.0 fL   MCH 31.9 26.0 - 34.0 pg   MCHC 33.5 32.0 - 36.0 g/dL   RDW 13.8 11.5 - 14.5 %   Platelets 260 150 - 440 K/uL  Basic metabolic panel     Status: Abnormal   Collection Time: 07/19/15  5:32 AM  Result Value Ref Range   Sodium 136 135 - 145 mmol/L   Potassium 3.8 3.5 - 5.1 mmol/L   Chloride 104 101 - 111 mmol/L   CO2 24 22 - 32 mmol/L   Glucose, Bld 86 65 - 99 mg/dL   BUN 25 (H) 6 - 20 mg/dL   Creatinine, Ser 0.67 0.44 - 1.00 mg/dL   Calcium 8.8 (L) 8.9 - 10.3 mg/dL   GFR calc non Af Amer >60 >60 mL/min   GFR calc Af Amer >60 >60 mL/min    Comment: (NOTE) The eGFR has been calculated using the CKD EPI equation. This calculation has not been validated in all clinical situations. eGFR's persistently <60 mL/min signify possible Chronic Kidney Disease.    Anion gap 8 5 - 15    Dg Neck Soft Tissue  07/18/2015   CLINICAL DATA:  Cough.  Hoarseness.  Altered mental status.  EXAM: NECK SOFT TISSUES - 1+ VIEW  COMPARISON:  None.  FINDINGS: Soft tissues of the neck are normal. Moderate to severe cervical spondylosis is present. Apparent ankylosis of C4-C5. Between 1 mm and 2 mm anterolisthesis of C 2 on C3 and C3 on C4 which appears degenerative.  IMPRESSION: Normal soft  tissue neck.   Electronically Signed   By: Dereck Ligas M.D.   On: 07/18/2015 19:15   Dg Chest 1 View  07/18/2015   CLINICAL DATA:  Hoarseness.  Cough.  Altered mental status.  EXAM: CHEST  1 VIEW  COMPARISON:  06/28/2015.  FINDINGS: Cardiopericardial silhouette is upper limits of normal for projection. Chronic tortuosity and calcification of the aortic arch. Configuration of the proximal LEFT humerus suggests an old healed fracture. Chronic scarring at the RIGHT costophrenic angle and RIGHT lung base. The LEFT lung is clear. No airspace disease. No gross hilar enlargement or significant change from prior exams in this patient with boy exchange.  IMPRESSION: No interval change or acute cardiopulmonary disease.   Electronically Signed   By: Dereck Ligas M.D.   On: 07/18/2015 19:08   Ct Head Wo Contrast  07/18/2015   CLINICAL DATA:  Code stroke. Altered mental status. Speech disturbance.  EXAM: CT HEAD WITHOUT CONTRAST  TECHNIQUE: Contiguous axial images were obtained from the base of the skull through the vertex without intravenous contrast.  COMPARISON:  06/28/2015.  FINDINGS: No mass lesion, mass effect, midline shift, hydrocephalus, hemorrhage. No acute territorial cortical ischemia/infarct. Atrophy and chronic ischemic white matter disease is present. Benign LEFT basal ganglia calcifications are present. Chronic internal capsule lacunar infarcts. Paranasal sinuses appear normal.  IMPRESSION: 1. Atrophy and chronic ischemic white matter disease without acute intracranial abnormality. 2. Critical Value/emergent results were called by telephone at the time of interpretation on 07/18/2015 at 6:54 pm to Dr. Larae Grooms , who verbally acknowledged these results.   Electronically Signed   By: Dereck Ligas M.D.   On: 07/18/2015 18:56   Mr Brain Wo Contrast  07/19/2015   CLINICAL DATA:  81 year old hypertensive female with hyperlipidemia presenting with altered mental status. Dementia. Subsequent  encounter.  EXAM: MRI HEAD WITHOUT CONTRAST  MRA HEAD WITHOUT CONTRAST  TECHNIQUE: Multiplanar, multiecho pulse sequences of the brain and surrounding structures were obtained without intravenous contrast. Angiographic images of the head were obtained using MRA technique without contrast.  COMPARISON:  07/18/2015 CT.  No comparison MR.  FINDINGS: MRI HEAD FINDINGS  Exam is motion degraded.  No acute infarct.  No intracranial hemorrhage.  Global atrophy without hydrocephalus.  Mild small vessel disease type changes.  No intracranial mass lesion noted on this unenhanced exam.  Transverse ligament hypertrophy. Mild narrowing ventral aspect of the cervical medullary junction.  MRA HEAD FINDINGS  Significantly motion degraded examination. All that can be stated with certainty is that there is flow within portions of the internal carotid arteries, vertebral arteries, basilar artery, anterior cerebral arteries and middle cerebral arteries. Grading stenosis or evaluating for aneurysm is not possible secondary to the significant motion artifact.  IMPRESSION: MRI HEAD  Exam is motion degraded.  No acute infarct.  Global atrophy without hydrocephalus.  Mild small vessel disease type changes.  Transverse ligament hypertrophy. Mild narrowing ventral aspect of the cervical medullary junction.  MRA HEAD  Significantly motion degraded examination. All that can be stated with certainty is that there is flow within portions of the internal carotid arteries, vertebral arteries, basilar artery, anterior cerebral arteries and middle cerebral arteries.  Grading stenosis or evaluating for aneurysm is not possible secondary to the significant motion artifact.   Electronically Signed   By: Genia Del M.D.   On: 07/19/2015 13:56   Mr Mra Head/brain Wo Cm  07/19/2015   CLINICAL DATA:  81 year old hypertensive female with hyperlipidemia presenting with altered mental status. Dementia. Subsequent encounter.  EXAM: MRI HEAD WITHOUT  CONTRAST  MRA HEAD WITHOUT CONTRAST  TECHNIQUE: Multiplanar, multiecho pulse sequences of the brain and surrounding structures were obtained without intravenous contrast. Angiographic images of the head were obtained using MRA technique without contrast.  COMPARISON:  07/18/2015 CT.  No comparison MR.  FINDINGS: MRI HEAD FINDINGS  Exam is motion degraded.  No acute infarct.  No intracranial hemorrhage.  Global atrophy without hydrocephalus.  Mild small vessel disease type changes.  No intracranial mass lesion noted on this unenhanced exam.  Transverse ligament hypertrophy. Mild narrowing ventral aspect of the cervical medullary junction.  MRA HEAD FINDINGS  Significantly motion degraded examination. All that can be stated with certainty is that there is flow within portions of the internal carotid arteries, vertebral arteries, basilar artery, anterior cerebral arteries and middle cerebral arteries. Grading stenosis or evaluating for aneurysm is not possible secondary to the significant motion artifact.  IMPRESSION: MRI HEAD  Exam is motion degraded.  No acute infarct.  Global atrophy without hydrocephalus.  Mild small vessel disease type changes.  Transverse ligament hypertrophy. Mild narrowing ventral aspect of the cervical medullary junction.  MRA HEAD  Significantly motion degraded examination. All that can be stated with certainty is that there is flow within portions of the internal carotid arteries, vertebral arteries, basilar artery, anterior cerebral arteries and middle cerebral arteries.  Grading stenosis or evaluating for aneurysm is not possible secondary to the significant motion artifact.   Electronically Signed   By: Genia Del M.D.   On: 07/19/2015 13:56    Review of Systems  Unable to perform ROS: dementia   Blood pressure 137/70, pulse 110, temperature 99.6 F (37.6 C), temperature source Oral, resp. rate 16, height $RemoveBe'4\' 11"'sJynlhaxF$  (1.499 m), weight 34.519 kg (76 lb 1.6 oz), SpO2 93 %. Physical  Exam  Nursing note and vitals reviewed. Constitutional: She appears distressed.  HENT:  Head: Normocephalic and atraumatic.  Right Ear: External ear normal.  Left Ear: External ear normal.  Nose: Nose normal.  Mouth/Throat: Oropharynx is clear and moist.  Eyes: Conjunctivae and EOM are normal. Pupils are equal, round, and reactive to light.  Neck: Normal range of motion. No JVD present. No thyromegaly present.  Cardiovascular: Regular rhythm, normal heart sounds and intact distal pulses.   No murmur heard. Respiratory: Effort normal. No respiratory distress. She has wheezes. She has rales.  GI: Soft. Bowel sounds are normal. She exhibits no distension.  Musculoskeletal: Normal range of motion.  Neurological:  Lethargic, opens but does not track or follow, GCS 7 PERRLA, +corneals, face symmetric Weakly withdrawals equally, increased tone with tremor B 3+/4 B, mute plantars Grimaces to pain  Skin: Skin is warm. She is diaphoretic. No erythema.   MRI personally reviewed by me and shows mild white matter changes and mild atrophy  Assessment/Plan: 1.  Encephalopathy-  To me this appears that pt maybe frozen from Parkinsons;  However, she has increased reflexes which can be seen in serotonin syndrome.  Also nutritional and infectious causes are a possibility.  Highly doubt seizures looking at exam 2.  Parkinsons-  Not controlled by exam but this may be due to pt missing her meds 3.  Dementia-  Appears to be end-stage by medications -  Drop NG tube for medications -  Increase Sinemet to 20/200 TID -  Continue Requip at current dose -  EEG -  D/c zoloft, neurotin, namenda and aricept -  Check ESR, CRP, CPK, B12/folate and TSH -  Increase IVF to 75cc/hr -  Would start tube feeds -  Will follow  Evalene Vath 07/19/2015, 5:55 PM

## 2015-07-19 NOTE — Clinical Social Work Note (Signed)
Clinical Social Work Assessment  Patient Details  Name: Beth Roberts MRN: [id number] Date of Birth: 1924-11-19  Date of referral:  07/19/15               Reason for consult:  Facility Placement, Other (Comment Required) (Patient is from [facility]. )                Permission sought to share information with:  Oceanographer granted to share information::  Yes, Verbal Permission Granted  Name::      [facility]  Agency::   Skilled Nursing Facility   Relationship::     Contact Information:     Housing/Transportation Living arrangements for the past 2 months:  Skilled Nursing Facility Source of Information:  Adult Children Patient Interpreter Needed:  None Criminal Activity/Legal Involvement Pertinent to Current Situation/Hospitalization:    Significant Relationships:  Adult Children Lives with:  Facility Resident Do you feel safe going back to the place where you live?  Yes Need for family participation in patient care:  Yes (Comment)  Care giving concerns: Patient is a long term care resident at [facility].    Social Worker assessment / plan: Visual merchandiser (CSW) attempted to meet with patient however she was asleep and could not participate in assessment. CSW contacted patient's daughter Beth Roberts. Per Beth Roberts patient has been at [facility] since April 2016. Daughter is agreeable for patient to return to [facility] from hospital. Per daughter patient has applied for long term Medicaid and is close to being approved. CSW received order from MD to do hospice screen. CSW made daughter aware that patient meets criteria for hospice services per MD. CSW provided hospice choice and daughter chose [facility]. [facility] liaison is aware of above. [facility] liaison is aware of above.   FL2 complete and on chart.    Employment status:  Retired Database administrator, Other (Comment Required) (Patient has applied for long term care mediciad.  ) PT Recommendations:  Skilled Nursing Facility Information / Referral to community resources:  Skilled Nursing Facility  Patient/Family's Response to care: Patient's daughter is agreeable for patient to return to [facility] with [facility] following.   Patient/Family's Understanding of and Emotional Response to Diagnosis, Current Treatment, and Prognosis: Daughter was pleasant and thanked CSW for calling.   Emotional Assessment Appearance:  Appears stated age Attitude/Demeanor/Rapport:    Affect (typically observed):  Unable to Assess Orientation:  Fluctuating Orientation (Suspected and/or reported Sundowners) Alcohol / Substance use:  Not Applicable Psych involvement (Current and /or in the community):  No (Comment)  Discharge Needs  Concerns to be addressed:  Discharge Planning Concerns Readmission within the last 30 days:  No Current discharge risk:  Chronically ill Barriers to Discharge:  Continued Medical Work up   Mirian Mo 07/19/2015, 2:59 PM

## 2015-07-19 NOTE — Progress Notes (Signed)
New referral for [facility] at [facility] after discharge received from CSW [facility]. PMH per chart review includes dementia, HTN, HLD, GERD and osteoporosis. She was brought to the ED from [facility] on 9/11 for evaluation of AMS. She has a previous ED visit on 8/22, for which she was prescribed antibiotics for a suspected pneumonia and returned to [facility].  WBC on admission 16.1, down to 13.7 with morning labs. Writer reviewed chart notes and spoke with both attending physician Dr. Sherryll Burger and Palliative medicine physician Dr. Orvan Falconer, as well as patient's daughter. Ms. Bezold appears to have had a swift decline in the last few days. Her daughter Kandis Ban reports that her mother has "just not been the same" since her pneumonia 3 weeks ago. Ms. Aden was, 3 weeks ago up in a wheel chair, able to propel herself around the facility, feed and toilet her self as well as converse. Her Parkinson's was diagnosed this past summer, but is not noted in any notes in the [facility] system as patient was seen in [place]. By observation patient appears appropriate for hospice services. Testing has been ordered to rule out an acute CVA. Maree did state that she did not want her mother "to suffer", but she is interested in finding any treatable causes for her mother's recent decline.  Patient seen lying in bed, mouth movements/twitching (?) noted as well as feet twitching and arm movements. Voice soft and difficult to understand. Per report of staff aide Lauris Poag, patient coughed up a large amount of brown sputum at lunch time. Per report of staff RN [facility] patient was able to take her medications crushed in applesauce this morning. Her daughter reports that she ( patient) was able to swallow pills with out difficulty prior to this recent decline. Neurology consult is pending d/t possible seizures. Will plan to follow up in the morning. Thank you. Dayna Barker RN, BSN, [facility]  of [place], [facility] [phone]

## 2015-07-20 ENCOUNTER — Observation Stay: Payer: Medicare Other

## 2015-07-20 DIAGNOSIS — R1310 Dysphagia, unspecified: Secondary | ICD-10-CM | POA: Diagnosis not present

## 2015-07-20 DIAGNOSIS — Z515 Encounter for palliative care: Secondary | ICD-10-CM | POA: Diagnosis not present

## 2015-07-20 DIAGNOSIS — R4182 Altered mental status, unspecified: Secondary | ICD-10-CM | POA: Diagnosis not present

## 2015-07-20 DIAGNOSIS — R4789 Other speech disturbances: Secondary | ICD-10-CM | POA: Diagnosis not present

## 2015-07-20 DIAGNOSIS — E43 Unspecified severe protein-calorie malnutrition: Secondary | ICD-10-CM | POA: Diagnosis not present

## 2015-07-20 LAB — VITAMIN B12: VITAMIN B 12: 924 pg/mL — AB (ref 180–914)

## 2015-07-20 MED ORDER — MORPHINE SULFATE (CONCENTRATE) 10 MG /0.5 ML PO SOLN: 5.0000 mg | ORAL | Status: DC | PRN

## 2015-07-20 MED ORDER — ACETAMINOPHEN 325 MG PO TABS: 650.0000 mg | ORAL_TABLET | Freq: Four times a day (QID) | ORAL | Status: AC | PRN

## 2015-07-20 MED ORDER — SENNA-DOCUSATE SODIUM 8.6-50 MG PO TABS: 2.0000 | ORAL_TABLET | Freq: Every day | ORAL | Status: DC

## 2015-07-20 MED ORDER — MORPHINE SULFATE (CONCENTRATE) 10 MG /0.5 ML PO SOLN
2.5000 mg | ORAL | Status: DC | PRN
Start: 2015-07-20 — End: 2017-05-22

## 2015-07-20 MED ORDER — ALPRAZOLAM 0.25 MG PO TABS: 0.2500 mg | ORAL_TABLET | Freq: Every evening | ORAL | Status: DC | PRN

## 2015-07-20 MED ORDER — CLONAZEPAM 0.5 MG PO TABS: ORAL_TABLET | ORAL | Status: DC

## 2015-07-20 MED ORDER — TRAMADOL HCL 50 MG PO TABS: 50.0000 mg | ORAL_TABLET | Freq: Two times a day (BID) | ORAL | Status: DC | PRN

## 2015-07-20 MED ORDER — SENNA-DOCUSATE SODIUM 8.6-50 MG PO TABS: 1.0000 | ORAL_TABLET | Freq: Two times a day (BID) | ORAL | Status: DC | PRN

## 2015-07-20 MED ORDER — SERTRALINE HCL 50 MG PO TABS: ORAL_TABLET | ORAL | Status: DC

## 2015-07-20 MED ORDER — SERTRALINE HCL 50 MG PO TABS: 50.0000 mg | ORAL_TABLET | Freq: Every day | ORAL | Status: DC

## 2015-07-20 MED ORDER — LORATADINE 10 MG PO TABS: 10.0000 mg | ORAL_TABLET | Freq: Every day | ORAL | Status: AC | PRN

## 2015-07-20 MED ORDER — BISACODYL 10 MG RE SUPP: 10.0000 mg | Freq: Every day | RECTAL | Status: AC | PRN

## 2015-07-20 NOTE — Discharge Summary (Addendum)
[facility] - [place] at [facility]   PATIENT NAME: Beth Roberts    MR#:  [id number]  DATE OF BIRTH:  11/20/1924  DATE OF ADMISSION:  07/18/2015 ADMITTING PHYSICIAN: Shaune Pollack, MD  DATE OF DISCHARGE: No discharge date for patient encounter.  PRIMARY CARE PHYSICIAN: Dorothey Baseman, MD    ADMISSION DIAGNOSIS:  Altered mental status, unspecified altered mental status type [R41.82]  DISCHARGE DIAGNOSIS:  Principal Problem:   Altered mental status Active Problems:   Protein-calorie malnutrition, severe  SECONDARY DIAGNOSIS:   Past Medical History  Diagnosis Date  . GERD (gastroesophageal reflux disease)     hiatal henia  . Hyperlipidemia   . Hypertension   . Osteoporosis   . Allergy     allergic rhinitis  . COPD (chronic obstructive pulmonary disease)     congenital Lung Deformity//Scar tissue on lung( cxr) after surgery  )   HOSPITAL COURSE:  81 year old female with severe protein calorie malnutrition, Alzheimer's dementia, Parkinson's disease, hypertension, hyperlipidemia, COPD, was admitted for altered mental status, concerning for possible stroke or seizure.  See Dr. Nicky Pugh dictated history and physical for further details.  Neurology consultation was obtained with Dr. Mellody Drown who recommended full neurological evaluation which was performed which all were essentially negative.  Her MRI did not show any acute CVA.   considering her overall decline over last several months, Palliative care consultation was obtained.  After permission with her daughter who was agreeable.  Patient was deemed appropriate for hospice services.  She continued coughing while in the hospital and felt to be at high risk for aspiration.  The therapy consultation was obtained who recommended appropriate dietary changes.  Patient's mental status seemed to be somewhat better today, but considering her overall health, Advanced age and declining condition.  She is being  discharged back to [facility] with hospice.  Daughter is agreeable with the same and all the consultants are too.  She remains at very high risk for recurrent aspiration pneumonias, fall, and overall health decline. DISCHARGE CONDITIONS:  critical CONSULTS OBTAINED:    DRUG ALLERGIES:   Allergies  Allergen Reactions  . Ace Inhibitors     Voice dysfunction  . Amlodipine     Flushing/ dizziness  . Codeine     REACTION: nausea and vomiting  . Klonopin [Clonazepam]     Dizzy/ sleepless  . Penicillins     REACTION: breaks out  . Requip [Ropinirole Hcl]     Not effective and makes her nervous  . Sulfonamide Derivatives    DISCHARGE MEDICATIONS:   Current Discharge Medication List    START taking these medications   Details  bisacodyl (DULCOLAX) 10 MG suppository Place 1 suppository (10 mg total) rectally daily as needed for moderate constipation. Qty: 12 suppository, Refills: 0    !! Morphine Sulfate (MORPHINE CONCENTRATE) 10 mg / 0.5 ml concentrated solution Take 0.13 mLs (2.6 mg total) by mouth every 4 (four) hours as needed for moderate pain. Qty: 30 mL, Refills: 0    !! Morphine Sulfate (MORPHINE CONCENTRATE) 10 mg / 0.5 ml concentrated solution Take 0.25 mLs (5 mg total) by mouth every 2 (two) hours as needed for severe pain or shortness of breath. Qty: 30 mL, Refills: 0     !! - Potential duplicate medications found. Please discuss with provider.    CONTINUE these medications which have CHANGED   Details  acetaminophen (TYLENOL) 325 MG tablet Take 2 tablets (650 mg total) by mouth every 6 (six)  hours as needed for mild pain, fever or headache. Qty: 30 tablet, Refills: 0    clonazePAM (KLONOPIN) 0.5 MG tablet One tab po qhs prn severe insomnia Qty: 5 tablet, Refills: 1    loratadine (CLARITIN) 10 MG tablet Take 1 tablet (10 mg total) by mouth daily as needed for allergies. Qty: 20 tablet, Refills: 0    sennosides-docusate sodium (SENOKOT-S) 8.6-50 MG tablet  Take 1 tablet by mouth 2 (two) times daily as needed for constipation. Qty: 20 tablet, Refills: 0    sertraline (ZOLOFT) 50 MG tablet Take 50 mg po daily for 2 days then 25 mg po daily for 6 days then 25 mg po every other day for 6 doses then 25 mg every 4th day for 4 doses then stop. Qty: 10 tablet, Refills: 0      CONTINUE these medications which have NOT CHANGED   Details  carbidopa-levodopa (SINEMET IR) 10-100 MG per tablet Take 1 tablet by mouth 3 (three) times daily.    Carboxymethylcell-Hypromellose (GENTEAL) 0.25-0.3 % GEL Apply 1 application to eye at bedtime.    cloNIDine (CATAPRES) 0.1 MG tablet Take 0.1 mg by mouth daily.    losartan (COZAAR) 50 MG tablet TAKE 1 TABLET BY MOUTH EVERY DAY Qty: 30 tablet, Refills: 2    omeprazole (PRILOSEC) 20 MG capsule TAKE 1 CAPSULE (20 MG TOTAL) BY MOUTH AT BEDTIME. Qty: 30 capsule, Refills: 0    ondansetron (ZOFRAN) 4 MG tablet Take 4 mg by mouth every 6 (six) hours as needed for nausea or vomiting.    Polyethyl Glycol-Propyl Glycol (SYSTANE) 0.4-0.3 % SOLN Apply 1 drop to eye 4 (four) times daily.    pramipexole (MIRAPEX) 0.125 MG tablet 1 in AM, 2 po q hs Qty: 90 tablet, Refills: 5      STOP taking these medications     aspirin EC 81 MG tablet      donepezil (ARICEPT) 10 MG tablet      ferrous sulfate 325 (65 FE) MG tablet      gabapentin (NEURONTIN) 100 MG capsule      hydrochlorothiazide (HYDRODIURIL) 25 MG tablet      hypromellose (GENTEAL) 0.3 % GEL ophthalmic ointment      Magnesium 250 MG TABS      memantine (NAMENDA XR) 28 MG CP24 24 hr capsule      naproxen sodium (ANAPROX) 220 MG tablet      traMADol (ULTRAM) 50 MG tablet      lidocaine (LIDODERM) 5 %      ALPRAZolam (XANAX) 0.25 MG tablet        DISCHARGE INSTRUCTIONS:   DIET:  Regular diet - Dysphagia 1 with thin consistecy  DISCHARGE CONDITION:  Serious ACTIVITY:  Activity as tolerated OXYGEN:  Home Oxygen: No.  Oxygen Delivery: room  air DISCHARGE LOCATION:  nursing home with hospice services  If you experience worsening of your admission symptoms, develop shortness of breath, life threatening emergency, suicidal or homicidal thoughts you must seek medical attention immediately by calling 911 or calling your MD immediately  if symptoms less severe.  You Must read complete instructions/literature along with all the possible adverse reactions/side effects for all the Medicines you take and that have been prescribed to you. Take any new Medicines after you have completely understood and accpet all the possible adverse reactions/side effects.   Please note  You were cared for by a hospitalist during your hospital stay. If you have any questions about your discharge medications or the care  you received while you were in the hospital after you are discharged, you can call the unit and asked to speak with the hospitalist on call if the hospitalist that took care of you is not available. Once you are discharged, your primary care physician will handle any further medical issues. Please note that NO REFILLS for any discharge medications will be authorized once you are discharged, as it is imperative that you return to your primary care physician (or establish a relationship with a primary care physician if you do not have one) for your aftercare needs so that they can reassess your need for medications and monitor your lab values.    On the day of Discharge: VITAL SIGNS:  Blood pressure 143/86, pulse 104, temperature 97.9 F (36.6 C), temperature source Oral, resp. rate 19, height 4\' 11"  (1.499 m), weight 76 lb 1.6 oz (34.519 kg), SpO2 95 %. PHYSICAL EXAMINATION:  GENERAL:  81 y.o.-year-old patient lying in the bed with no acute distress.  EYES: Pupils equal, round, reactive to light and accommodation. No scleral icterus. Extraocular muscles intact.  HEENT: Head atraumatic, normocephalic. Oropharynx and nasopharynx clear.  NECK:   Supple, no jugular venous distention. No thyroid enlargement, no tenderness.  LUNGS: Normal breath sounds bilaterally, no wheezing, rales,rhonchi or crepitation. No use of accessory muscles of respiration.  CARDIOVASCULAR: S1, S2 normal. No murmurs, rubs, or gallops.  ABDOMEN: Soft, non-tender, non-distended. Bowel sounds present. No organomegaly or mass.  EXTREMITIES: No pedal edema, cyanosis, or clubbing.  NEUROLOGIC: Lethargic, opens but does not track or follow, she is alert  PERRLA, +corneals, face symmetric Weakly withdrawals equally, increased tone with tremor  Grimaces to pain  PSYCHIATRIC: The patient is alert and oriented to person SKIN: No obvious rash, lesion, or ulcer.  DATA REVIEW:   CBC  Recent Labs Lab 07/19/15 0532  WBC 13.7*  HGB 10.3*  HCT 30.9*  PLT 260    Chemistries   Recent Labs Lab 07/18/15 1909 07/19/15 0532  NA 134* 136  K 4.0 3.8  CL 97* 104  CO2 30 24  GLUCOSE 114* 86  BUN 27* 25*  CREATININE 0.76 0.67  CALCIUM 9.3 8.8*  AST 20  --   ALT 10*  --   ALKPHOS 90  --   BILITOT 0.9  --     Microbiology Results  Results for orders placed or performed during the hospital encounter of 07/18/15  MRSA PCR Screening     Status: Abnormal   Collection Time: 07/19/15 12:03 AM  Result Value Ref Range Status   MRSA by PCR POSITIVE (A) NEGATIVE Final    Comment:        The GeneXpert MRSA Assay (FDA approved for NASAL specimens only), is one component of a comprehensive MRSA colonization surveillance program. It is not intended to diagnose MRSA infection nor to guide or monitor treatment for MRSA infections. CRITICAL RESULT CALLED TO, READ BACK BY AND VERIFIED WITH: KRISTINA [facility] 07/19/2015 0127 [facility]     RADIOLOGY:  Dg Neck Soft Tissue  07/18/2015   CLINICAL DATA:  Cough.  Hoarseness.  Altered mental status.  EXAM: NECK SOFT TISSUES - 1+ VIEW  COMPARISON:  None.  FINDINGS: Soft tissues of the neck are normal. Moderate to severe cervical  spondylosis is present. Apparent ankylosis of C4-C5. Between 1 mm and 2 mm anterolisthesis of C 2 on C3 and C3 on C4 which appears degenerative.  IMPRESSION: Normal soft tissue neck.   Electronically Signed   By: Juliene Pina  Lamke M.D.   On: 07/18/2015 19:15   Dg Chest 1 View  07/18/2015   CLINICAL DATA:  Hoarseness.  Cough.  Altered mental status.  EXAM: CHEST  1 VIEW  COMPARISON:  06/28/2015.  FINDINGS: Cardiopericardial silhouette is upper limits of normal for projection. Chronic tortuosity and calcification of the aortic arch. Configuration of the proximal LEFT humerus suggests an old healed fracture. Chronic scarring at the RIGHT costophrenic angle and RIGHT lung base. The LEFT lung is clear. No airspace disease. No gross hilar enlargement or significant change from prior exams in this patient with boy exchange.  IMPRESSION: No interval change or acute cardiopulmonary disease.   Electronically Signed   By: Andreas Newport M.D.   On: 07/18/2015 19:08   Ct Head Wo Contrast  07/18/2015   CLINICAL DATA:  Code stroke. Altered mental status. Speech disturbance.  EXAM: CT HEAD WITHOUT CONTRAST  TECHNIQUE: Contiguous axial images were obtained from the base of the skull through the vertex without intravenous contrast.  COMPARISON:  06/28/2015.  FINDINGS: No mass lesion, mass effect, midline shift, hydrocephalus, hemorrhage. No acute territorial cortical ischemia/infarct. Atrophy and chronic ischemic white matter disease is present. Benign LEFT basal ganglia calcifications are present. Chronic internal capsule lacunar infarcts. Paranasal sinuses appear normal.  IMPRESSION: 1. Atrophy and chronic ischemic white matter disease without acute intracranial abnormality. 2. Critical Value/emergent results were called by telephone at the time of interpretation on 07/18/2015 at 6:54 pm to Dr. Gladstone Pih , who verbally acknowledged these results.   Electronically Signed   By: Andreas Newport M.D.   On: 07/18/2015 18:56    Mr Brain Wo Contrast  07/19/2015   CLINICAL DATA:  81 year old hypertensive female with hyperlipidemia presenting with altered mental status. Dementia. Subsequent encounter.  EXAM: MRI HEAD WITHOUT CONTRAST  MRA HEAD WITHOUT CONTRAST  TECHNIQUE: Multiplanar, multiecho pulse sequences of the brain and surrounding structures were obtained without intravenous contrast. Angiographic images of the head were obtained using MRA technique without contrast.  COMPARISON:  07/18/2015 CT.  No comparison MR.  FINDINGS: MRI HEAD FINDINGS  Exam is motion degraded.  No acute infarct.  No intracranial hemorrhage.  Global atrophy without hydrocephalus.  Mild small vessel disease type changes.  No intracranial mass lesion noted on this unenhanced exam.  Transverse ligament hypertrophy. Mild narrowing ventral aspect of the cervical medullary junction.  MRA HEAD FINDINGS  Significantly motion degraded examination. All that can be stated with certainty is that there is flow within portions of the internal carotid arteries, vertebral arteries, basilar artery, anterior cerebral arteries and middle cerebral arteries. Grading stenosis or evaluating for aneurysm is not possible secondary to the significant motion artifact.  IMPRESSION: MRI HEAD  Exam is motion degraded.  No acute infarct.  Global atrophy without hydrocephalus.  Mild small vessel disease type changes.  Transverse ligament hypertrophy. Mild narrowing ventral aspect of the cervical medullary junction.  MRA HEAD  Significantly motion degraded examination. All that can be stated with certainty is that there is flow within portions of the internal carotid arteries, vertebral arteries, basilar artery, anterior cerebral arteries and middle cerebral arteries.  Grading stenosis or evaluating for aneurysm is not possible secondary to the significant motion artifact.   Electronically Signed   By: Lacy Duverney M.D.   On: 07/19/2015 13:56   Mr Maxine Glenn Head/brain Wo Cm  07/19/2015    CLINICAL DATA:  81 year old hypertensive female with hyperlipidemia presenting with altered mental status. Dementia. Subsequent encounter.  EXAM: MRI HEAD WITHOUT CONTRAST  MRA HEAD WITHOUT CONTRAST  TECHNIQUE: Multiplanar, multiecho pulse sequences of the brain and surrounding structures were obtained without intravenous contrast. Angiographic images of the head were obtained using MRA technique without contrast.  COMPARISON:  07/18/2015 CT.  No comparison MR.  FINDINGS: MRI HEAD FINDINGS  Exam is motion degraded.  No acute infarct.  No intracranial hemorrhage.  Global atrophy without hydrocephalus.  Mild small vessel disease type changes.  No intracranial mass lesion noted on this unenhanced exam.  Transverse ligament hypertrophy. Mild narrowing ventral aspect of the cervical medullary junction.  MRA HEAD FINDINGS  Significantly motion degraded examination. All that can be stated with certainty is that there is flow within portions of the internal carotid arteries, vertebral arteries, basilar artery, anterior cerebral arteries and middle cerebral arteries. Grading stenosis or evaluating for aneurysm is not possible secondary to the significant motion artifact.  IMPRESSION: MRI HEAD  Exam is motion degraded.  No acute infarct.  Global atrophy without hydrocephalus.  Mild small vessel disease type changes.  Transverse ligament hypertrophy. Mild narrowing ventral aspect of the cervical medullary junction.  MRA HEAD  Significantly motion degraded examination. All that can be stated with certainty is that there is flow within portions of the internal carotid arteries, vertebral arteries, basilar artery, anterior cerebral arteries and middle cerebral arteries.  Grading stenosis or evaluating for aneurysm is not possible secondary to the significant motion artifact.   Electronically Signed   By: Lacy Duverney M.D.   On: 07/19/2015 13:56   She is being discharged to [facility] with a plan for hospice  services.  Management plans discussed with the patient, family and they are in agreement.  CODE STATUS: DO NOT RESUSCITATE  TOTAL TIME TAKING CARE OF THIS PATIENT: 55 minutes.    [facility], Rozena Fierro M.D on 07/20/2015 at 3:17 PM  Between 7am to 6pm - Pager - [phone]  After 6pm go to [email or web address] - password [email or web address] [facility]  [place] [facility]  Office  [phone]  CC: Primary care physician; Dorothey Baseman, MD

## 2015-07-20 NOTE — Discharge Instructions (Signed)
Dementia Dementia is a general term for problems with brain function. A person with dementia has memory loss and a hard time with at least one other brain function such as thinking, speaking, or problem solving. Dementia can affect social functioning, how you do your job, your mood, or your personality. The changes may be hidden for a long time. The earliest forms of this disease are usually not detected by family or friends. Dementia can be:  Irreversible.  Potentially reversible.  Partially reversible.  Progressive. This means it can get worse over time. CAUSES  Irreversible dementia causes may include:  Degeneration of brain cells (Alzheimer disease or Lewy body dementia).  Multiple small strokes (vascular dementia).  Infection (chronic meningitis or Creutzfeldt-Jakob disease).  Frontotemporal dementia. This affects younger people, age 40 to 70, compared to those who have Alzheimer disease.  Dementia associated with other disorders like Parkinson disease, Huntington disease, or HIV-associated dementia. Potentially or partially reversible dementia causes may include:  Medicines.  Metabolic causes such as excessive alcohol intake, vitamin B12 deficiency, or thyroid disease.  Masses or pressure in the brain such as a tumor, blood clot, or hydrocephalus. SIGNS AND SYMPTOMS  Symptoms are often hard to detect. Family members or coworkers may not notice them early in the disease process. Different people with dementia may have different symptoms. Symptoms can include:  A hard time with memory, especially recent memory. Long-term memory may not be impaired.  Asking the same question multiple times or forgetting something someone just said.  A hard time speaking your thoughts or finding certain words.  A hard time solving problems or performing familiar tasks (such as how to use a telephone).  Sudden changes in mood.  Changes in personality, especially increasing moodiness or  mistrust.  Depression.  A hard time understanding complex ideas that were never a problem in the past. DIAGNOSIS  There are no specific tests for dementia.   Your health care provider may recommend a thorough evaluation. This is because some forms of dementia can be reversible. The evaluation will likely include a physical exam and getting a detailed history from you and a family member. The history often gives the best clues and suggestions for a diagnosis.  Memory testing may be done. A detailed brain function evaluation called neuropsychologic testing may be helpful.  Lab tests and brain imaging (such as a CT scan or MRI scan) are sometimes important.  Sometimes observation and re-evaluation over time is very helpful. TREATMENT  Treatment depends on the cause.   If the problem is a vitamin deficiency, it may be helped or cured with supplements.  For dementias such as Alzheimer disease, medicines are available to stabilize or slow the course of the disease. There are no cures for this type of dementia.  Your health care provider can help direct you to groups, organizations, and other health care providers to help with decisions in the care of you or your loved one. HOME CARE INSTRUCTIONS The care of individuals with dementia is varied and dependent upon the progression of the dementia. The following suggestions are intended for the person living with, or caring for, the person with dementia.  Create a safe environment.  Remove the locks on bathroom doors to prevent the person from accidentally locking himself or herself in.  Use childproof latches on kitchen cabinets and any place where cleaning supplies, chemicals, or alcohol are kept.  Use childproof covers in unused electrical outlets.  Install childproof devices to keep doors and windows   secured.  Remove stove knobs or install safety knobs and an automatic shut-off on the stove.  Lower the temperature on water  heaters.  Label medicines and keep them locked up.  Secure knives, lighters, matches, power tools, and guns, and keep these items out of reach.  Keep the house free from clutter. Remove rugs or anything that might contribute to a fall.  Remove objects that might break and hurt the person.  Make sure lighting is good, both inside and outside.  Install grab rails as needed.  Use a monitoring device to alert you to falls or other needs for help.  Reduce confusion.  Keep familiar objects and people around.  Use night lights or dim lights at night.  Label items or areas.  Use reminders, notes, or directions for daily activities or tasks.  Keep a simple, consistent routine for waking, meals, bathing, dressing, and bedtime.  Create a calm, quiet environment.  Place large clocks and calendars prominently.  Display emergency numbers and home address near all telephones.  Use cues to establish different times of the day. An example is to open curtains to let the natural light in during the day.   Use effective communication.  Choose simple words and short sentences.  Use a gentle, calm tone of voice.  Be careful not to interrupt.  If the person is struggling to find a word or communicate a thought, try to provide the word or thought.  Ask one question at a time. Allow the person ample time to answer questions. Repeat the question again if the person does not respond.  Reduce nighttime restlessness.  Provide a comfortable bed.  Have a consistent nighttime routine.  Ensure a regular walking or physical activity schedule. Involve the person in daily activities as much as possible.  Limit napping during the day.  Limit caffeine.  Attend social events that stimulate rather than overwhelm the senses.  Encourage good nutrition and hydration.  Reduce distractions during meal times and snacks.  Avoid foods that are too hot or too cold.  Monitor chewing and swallowing  ability.  Continue with routine vision, hearing, dental, and medical screenings.  Give medicines only as directed by the health care provider.  Monitor driving abilities. Do not allow the person to drive when safe driving is no longer possible.  Register with an identification program which could provide location assistance in the event of a missing person situation. SEEK MEDICAL CARE IF:   New behavioral problems start such as moodiness, aggressiveness, or seeing things that are not there (hallucinations).  Any new problem with brain function happens. This includes problems with balance, speech, or falling a lot.  Problems with swallowing develop.  Any symptoms of other illness happen. Small changes or worsening in any aspect of brain function can be a sign that the illness is getting worse. It can also be a sign of another medical illness such as infection. Seeing a health care provider right away is important. SEEK IMMEDIATE MEDICAL CARE IF:   A fever develops.  New or worsened confusion develops.  New or worsened sleepiness develops.  Staying awake becomes hard to do. Document Released: 04/18/2001 Document Revised: 03/09/2014 Document Reviewed: 03/20/2011 [facility] Patient Information 2015 [place], [place]. This information is not intended to replace advice given to you by your health care provider. Make sure you discuss any questions you have with your health care provider.  Parkinson Disease Parkinson disease is a disorder of the brain and spinal cord (central nervous  system). The person will slowly lose the ability to control his or her body movements. This happens due to:  Damaged nerve cells.  Low levels of a certain brain chemical. HOME CARE  Exercise often.  Make time to rest during the day.  Take all medicine as told by your doctor.  Replace buttons and zippers with elastic and Velcro if getting dressed is difficult.  Put grab bars or rails in your home. This  helps you to not fall.  Go to speech therapy or therapy to help you with daily activities (occupational therapy). Do this as told by your doctor.  Keep all doctor visits as told. GET HELP IF:  Your medicine does not help your symptoms.  You fall.  You have trouble swallowing or choke on your food. MAKE SURE YOU:  Understand these instructions.  Will watch your condition.  Will get help right away if you are not doing well or get worse. Document Released: 01/15/2012 Document Revised: 02/17/2013 Document Reviewed: 01/15/2012 [facility] Patient Information 2015 [place], [place]. This information is not intended to replace advice given to you by your health care provider. Make sure you discuss any questions you have with your health care provider.  Failure to Thrive, Adult  Adult failure to thrive is a condition that some older people can get over time. These people are not able to do things they once could. They may not be interested in the things they once liked. This is not a normal part of aging. Treatment is directed at the cause. Sometimes, treatment is not possible if a cause cannot be found. HOME CARE Home care depends on the cause of the condition. Basic home care includes:  Taking all medicines as told by the doctor.  Eating healthy foods.  Asking the doctor about taking vitamins, herbs, or nutrition drinks.  Exercising.  Making sure the home is safe.  Understanding what to do when the person can no longer make decisions on his or her own. GET HELP RIGHT AWAY IF:  The person has thoughts about ending his or her life.  The person cannot eat or drink.  The person does not get out of bed.  Staying at home is not safe.  The person has a fever.  There are questions about medicines.  There are questions about treatment effects.  The person is not able to eat well.  The person is not able to move around.  The person feels very sad or hopeless. MAKE SURE  YOU:  Understand these instructions.  Will watch the person's condition.  Will get help right away if the person is not doing well or gets worse. Document Released: 10/12/2011 Document Revised: 01/15/2012 Document Reviewed: 10/12/2011 [facility] Patient Information 2015 [place], [place]. This information is not intended to replace advice given to you by your health care provider. Make sure you discuss any questions you have with your health care provider.

## 2015-07-20 NOTE — Progress Notes (Signed)
Neurology Note  S:  Daughter at bedside today;  Does not seem to want to add any additional medications to mother and states that she has been gradually declining.  ROS unobtainable secondary to mental status  O:  97.6   137/70   113  18 Nl weight, NAD Normocephalic, oropharynx clear Supple, no JVD CTA B, no wheezing Tachycardic, no murmur No C/C/E  Alert and oriented to person only, still whispers and will follow occasionally PERRLA, EOMI, face symmetric 4/5 B, increased tone and tremor present  A/P: 1.  Parkinsons-  This still is not controlled but better now that pt got some of her medications than yesterday;  I personally believe that this is the problem that she does not have enough Dopamine but daughter seems somewhat resistant to this. 2.  Encephalopathy-  Improved -  Recommend Neupro  patch daily -  Continue Sinemet 25/100mg  TID and also Mirapex at home dose -  Can restart Namenda and Aricept as outpatient -  Would continue to hold Zoloft -  Will sign off, please call with questions -  Pt can f/u as scheduled with [facility]

## 2015-07-20 NOTE — Progress Notes (Addendum)
Palliative Care Update   Patient has a MOST form that is consistent with her own wishes.  That form and my note indicated that tube feedings are not to be initiated.  Of course, patient's daughter did consent to placement of the NG tube when she was called about this, but she is a bit confused about the issue as it relates to patient's MOST form directives.    The MOST form needs to be cancelled (there is a place on the form) as it is considered an ORDER -- if there is to be a change in this directive about tube feedings, tube placement for meds etc.  Patient's daughter can override the MOST form currently still in effect and valid.   I will see pt again.  I have spoken with patient's daughter, and she most wants to talk to the neurologist.  I have spoken with Hospice Liaison nurse and we will be talking together with Dr. Sherryll Burger, attending, and with Dr. Katrinka Blazing.    Suan Halter, MD

## 2015-07-20 NOTE — Progress Notes (Signed)
Palliative Medicine Inpatient Consult Follow Up Note   Name: Beth Roberts Date: 07/20/2015 MRN: [id number]  DOB: [birth year]  Referring Physician: Delfino Lovett, MD  Palliative Care consult requested for this 81 y.o. female for goals of medical therapy in patient with end stage Parkinson's Disease with rapid neurologic decline.    TODAY'S CONVERSATIONS, EVENTS, AND PLANS:   1.  Pt continues with DNR status.  2.  After pt got an NG tube last night and after she pulled it out, we CONFIRMED that pt does NOT WANT ANY FEEDING TUBES. This is listed on her MOST FORm (which is an order and has to be revoked if directives are to be changed).  NO FEEDING TUBES = pt wishes.  3.  Daughter agree to have pt followed by [facility] [place]. The facility is to obtain a consult from Hospice after she is admitted there.    4.  I have coordinated pts discharge meds in talking with Dayna Barker [facility] Nurse Liaison), Dr. Sherryll Burger (attending), and with pt's daughter.  Pt is having a big reduction in some nonessential medications.  She needs fewer pills to leave some room in her stomach for food.  She had been nauseated recently and the reduction in dementia meds and focusing on the Parkinson's meds and comfort meds is essential at this point.  5.  Dr. Katrinka Blazing, neurologist had mentioned pt using a patch for Parkinson medicaiton delivery. At this time, Neupro is not needed since she does take her medications and also since focus is on comfort .  Additionally, Neupro might not be on the facility or Hospice formulary, and daughter is aware of this issue and others associated with pt's Parkinson's meds.    REVIEW OF SYSTEMS:  Patient is not able to provide ROS due to dementia   CODE STATUS: DNR   PAST MEDICAL HISTORY: Past Medical History  Diagnosis Date  . GERD (gastroesophageal reflux disease)     hiatal henia  . Hyperlipidemia   . Hypertension   . Osteoporosis   . Allergy     allergic rhinitis  .  COPD (chronic obstructive pulmonary disease)     congenital Lung Deformity//Scar tissue on lung( cxr) after surgery  )    PAST SURGICAL HISTORY:  Past Surgical History  Procedure Laterality Date  . Appendectomy    . Eye surgery      cataract extraction, bilateral  . Abdominal hysterectomy      partial ; bleeding  . Lung removal, partial  1988    Lung -wedge resection/s/p sequestration (Has scar tissue on cxr now)  . Shoulder surgery  2000-2001    Bilaterial  . Foot ganglion excision    . Fracture surgery  2008    left shoulder   . Fracture left shoulder  2008  . Cystoscopy  1993    bladder diverticulum  . Ct lung screening  12/2004    right basilor ? mass// Lung CT stable 05/2005    Vital Signs: BP 143/86 mmHg  Pulse 104  Temp(Src) 97.9 F (36.6 C) (Oral)  Resp 19  Ht 4\' 11"  (1.499 m)  Wt 34.519 kg (76 lb 1.6 oz)  BMI 15.36 kg/m2  SpO2 95% Filed Weights   07/18/15 1815 07/18/15 2321  Weight: 37.195 kg (82 lb) 34.519 kg (76 lb 1.6 oz)    Estimated body mass index is 15.36 kg/(m^2) as calculated from the following:   Height as of this encounter: 4\' 11"  (1.499 m).   Weight as  of this encounter: 34.519 kg (76 lb 1.6 oz).  PHYSICAL EXAM: NAD EOMI OP clear Neck wo JVD or Tm Heart rrr no m Lungs cta but with occas wet cough (rare --upper rattles with cough) Abd soft and Nontender Ext thin/ cachectic Skin warm and dry Neuro --still making her lips move as if she is talking to someone but she doesn't vocalize or speak to me and keeps on talking to whoever she is seeing.   LABS: CBC:    Component Value Date/Time   WBC 13.7* 07/19/2015 0532   WBC 11.7* 02/22/2015 0816   HGB 10.3* 07/19/2015 0532   HGB 11.4* 02/22/2015 0816   HCT 30.9* 07/19/2015 0532   HCT 34.7* 02/22/2015 0816   PLT 260 07/19/2015 0532   PLT 503* 02/22/2015 0816   MCV 95.2 07/19/2015 0532   MCV 94 02/22/2015 0816   NEUTROABS 13.8* 07/18/2015 1909   LYMPHSABS 1.0 07/18/2015 1909   MONOABS  1.1* 07/18/2015 1909   EOSABS 0.1 07/18/2015 1909   BASOSABS 0.1 07/18/2015 1909   Comprehensive Metabolic Panel:    Component Value Date/Time   NA 136 07/19/2015 0532   NA 139 02/22/2015 0816   K 3.8 07/19/2015 0532   K 3.6 02/22/2015 0816   CL 104 07/19/2015 0532   CL 102 02/22/2015 0816   CO2 24 07/19/2015 0532   CO2 28 02/22/2015 0816   BUN 25* 07/19/2015 0532   BUN 17 02/22/2015 0816   CREATININE 0.67 07/19/2015 0532   CREATININE 0.63 02/22/2015 0816   GLUCOSE 86 07/19/2015 0532   GLUCOSE 104* 02/22/2015 0816   CALCIUM 8.8* 07/19/2015 0532   CALCIUM 9.3 02/22/2015 0816   AST 20 07/18/2015 1909   AST 33 02/22/2015 0816   ALT 10* 07/18/2015 1909   ALT 19 02/22/2015 0816   ALKPHOS 90 07/18/2015 1909   ALKPHOS 79 02/22/2015 0816   BILITOT 0.9 07/18/2015 1909   BILITOT 0.3 02/22/2015 0816   PROT 6.9 07/18/2015 1909   PROT 6.6 02/22/2015 0816   ALBUMIN 3.7 07/18/2015 1909   ALBUMIN 3.3* 02/22/2015 0816    IMPRESSION: 1. Altered mental status and functional status ---with suspicion of possible CVA ---pt has just had an MRI of brain and results are still pending ---UA was negative for signs of UTI 2. Dysphagia with cough (cough not necessarily correlated with po intake) ---opens mouth for oral intake presented by spoon. Needs cueing of spoon at lips ---on Dysphagia 1 (pureed) with think liquids (slow rate, small bites/ sips alternating ) 3. Dehydration 4. Severe protein calorie malnutrition ---with  5. Essential HTN 6. Hyperlipidemia 7. COPD 8. Dementia --advanced / advancing ---Has Alzheimer's listed as a diagnosis ---has had lacunar infarcts per CT of her head and likely has a component of vascular dementia ---incontinent of Bowel and bladder ---currently able to vocalize but not making sense or speaking in words.  9. Restless leg Syndrome  ---Failed requip, klonopin, and gabapentin in 2014 10. Anxiety disorder 11. COPD (but chart states she is  a 'never smoker') 12. GERD 13. Osteoporosis 14 History of shoulder surgery 15. H/O lung surgery 1988 (partial resection) with COPD since this event 16. R hip fracture March 9th with ORIF 17. Parkinson's --diagnosed by a Neurologist in June, 2016. ---this is detailed in Dr. Mcarthur Rossetti note dated 05/18/15 18. Chronic headache and occipital Neuralgia  19. Anemia --mild of unclear etiology  20. Constipation 21. Depression 22. Seasonal allergies 23. Hallucinations  24. Possible EPS --reaching in air and mouth  movements  See plan above.   REFERRALS TO BE ORDERED:  [facility] --to follow pt in the facility.    More than 50% of the visit was spent in counseling/coordination of care: YES  Time Spent:  50 min

## 2015-07-20 NOTE — Plan of Care (Signed)
Problem: Discharge/Transitional Outcomes Goal: Other Discharge Outcomes/Goals Outcome: Progressing Pt is alert at times with stimulation, very lethargic. However, ptt is constantly moving, picking at things, pulled out  NG tube almost all th eway despite safety mittens placed, unable to give night time medications. No signs or symptoms of pain. NS at 75 ml continued.

## 2015-07-20 NOTE — Progress Notes (Signed)
Patient discharged to [facility] via EMS. Merelin Human S, RN  

## 2015-07-20 NOTE — Progress Notes (Signed)
Report called to [place] at [facility]. EMS called for transport. Awaiting transportation. Bo Mcclintock, RN

## 2015-07-20 NOTE — Progress Notes (Signed)
Follow up visit made on new referral for hospice services at [facility] after discharge. Patient seen lying in bed, alert, daughter Kandis Ban at bedside. Patient did make eye contact with Clinical research associate, she did not appropriately answer writers questions, her voice remains a whisper. She remains unable to feed herself, nonambulatory and incontinent.  Per chart notes patient did have an NG tube placed yesterday evening, which she pulled out overnight. Her mittens remain in place. Of note she does have a MOST form in place on her chart as well as a signed out of facility DNR. Form.  She was able to take her oral medications in applesauce this morning and ate 25% of breakfast. Writer had a long talk with [facility], she voiced her understanding that her mother is declining, for reasons unknown at this time. She remains in agreement with hospice services after discharge. Writer did again reiterated to [facility] that hospice services will be initiated after an order is received from her attending physician Dr. Terance Hart upon readmission to [facility]. She voiced her understanding .Updated notes sent to referral intake. Patient to discharge via EMS. Thank you. Dayna Barker RN, BSN, [facility] [place], [facility] [phone] c

## 2015-07-20 NOTE — Progress Notes (Signed)
Patient is medically stable for D/C back to [facility] today with [place] following. [facility] liaison is aware of above. Per Jomarie Longs [facility] liaison patient is going to room 128. RN will call report to RN Irena Cords and arrange EMS for transport. Clinical Child psychotherapist (CSW) prepared D/C packet and sent D/C Summary to [employer] via [facility]. Patient's daughter Hilda Lias is at bedside and aware of above. Please reconsult if future social work needs arise. CSW signing off.   Jetta Lout, LCSWA [phone]

## 2015-07-20 NOTE — Plan of Care (Signed)
Problem: SLP Dysphagia Goals Goal: Misc Dysphagia Goal Pt will safely tolerate po diet of least restrictive consistency w/ no overt s/s of aspiration noted by Staff/pt/family x3 sessions.    

## 2015-07-21 LAB — HIGH SENSITIVITY CRP: CRP HIGH SENSITIVITY: 298.7 mg/L — AB (ref 0.00–3.00)

## 2016-03-09 ENCOUNTER — Emergency Department

## 2016-03-09 ENCOUNTER — Emergency Department
Admission: EM | Admit: 2016-03-09 | Discharge: 2016-03-09 | Disposition: A | Discharge status: Home / Self Care | Attending: Emergency Medicine | Admitting: Emergency Medicine

## 2016-03-09 ENCOUNTER — Encounter: Payer: Self-pay | Admitting: Emergency Medicine

## 2016-03-09 DIAGNOSIS — W1800XA Striking against unspecified object with subsequent fall, initial encounter: Secondary | ICD-10-CM | POA: Diagnosis not present

## 2016-03-09 DIAGNOSIS — Y939 Activity, unspecified: Secondary | ICD-10-CM | POA: Diagnosis not present

## 2016-03-09 DIAGNOSIS — M810 Age-related osteoporosis without current pathological fracture: Secondary | ICD-10-CM | POA: Diagnosis not present

## 2016-03-09 DIAGNOSIS — Y999 Unspecified external cause status: Secondary | ICD-10-CM | POA: Insufficient documentation

## 2016-03-09 DIAGNOSIS — Z79899 Other long term (current) drug therapy: Secondary | ICD-10-CM | POA: Insufficient documentation

## 2016-03-09 DIAGNOSIS — I10 Essential (primary) hypertension: Secondary | ICD-10-CM | POA: Diagnosis not present

## 2016-03-09 DIAGNOSIS — S0990XA Unspecified injury of head, initial encounter: Secondary | ICD-10-CM | POA: Diagnosis present

## 2016-03-09 DIAGNOSIS — J449 Chronic obstructive pulmonary disease, unspecified: Secondary | ICD-10-CM | POA: Diagnosis not present

## 2016-03-09 DIAGNOSIS — S0101XA Laceration without foreign body of scalp, initial encounter: Secondary | ICD-10-CM | POA: Insufficient documentation

## 2016-03-09 DIAGNOSIS — E785 Hyperlipidemia, unspecified: Secondary | ICD-10-CM | POA: Insufficient documentation

## 2016-03-09 DIAGNOSIS — Y92129 Unspecified place in nursing home as the place of occurrence of the external cause: Secondary | ICD-10-CM | POA: Diagnosis not present

## 2016-03-09 DIAGNOSIS — IMO0002 Reserved for concepts with insufficient information to code with codable children: Secondary | ICD-10-CM

## 2016-03-09 DIAGNOSIS — Z880 Allergy status to penicillin: Secondary | ICD-10-CM | POA: Diagnosis not present

## 2016-03-09 DIAGNOSIS — W19XXXA Unspecified fall, initial encounter: Secondary | ICD-10-CM

## 2016-03-09 DIAGNOSIS — S61412A Laceration without foreign body of left hand, initial encounter: Secondary | ICD-10-CM | POA: Diagnosis not present

## 2016-03-09 MED ORDER — CEPHALEXIN 500 MG PO CAPS: 500.0000 mg | ORAL_CAPSULE | Freq: Three times a day (TID) | ORAL | Status: DC

## 2016-03-09 MED ORDER — LIDOCAINE-EPINEPHRINE (PF) 1 %-1:200000 IJ SOLN
INTRAMUSCULAR | Status: AC
  Filled 2016-03-09: qty 30

## 2016-03-09 NOTE — ED Notes (Signed)
Pt to be transported back to [facility] Therapist, nutritional- secretary given paperwork to call for ride

## 2016-03-09 NOTE — ED Notes (Signed)
E[facility] on scene [facility]

## 2016-03-09 NOTE — ED Notes (Signed)
Spoke with hospice nurse and updated her on status

## 2016-03-09 NOTE — ED Notes (Signed)
Per [facility] pt fell - with lac to head and skin tears to left hand/wrist. Hand was wrapped pta, bleeding controlled to lac

## 2016-03-09 NOTE — ED Notes (Signed)
Pt's hand sutured then covered in telfa and wrapped with koflex. Head laceration stapled by doctor

## 2016-03-09 NOTE — ED Provider Notes (Signed)
[facility] Emergency Department Provider Note  Time seen: 3:43 PM  I have reviewed the triage vital signs and the nursing notes.   HISTORY  Chief Complaint Head Laceration    HPI Beth Roberts is a 82 y.o. female with a past medical history of hypertension, hyperlipidemia, COPD, dementia, presents to the emergency department from her nursing home after a fall. Patient fell hitting her head with a small laceration to her scalp and a fairly large skin tear to her left hand. Patient is unable to provide any additional history. Does not appear to be in any discomfort.     Past Medical History  Diagnosis Date  . GERD (gastroesophageal reflux disease)     hiatal henia  . Hyperlipidemia   . Hypertension   . Osteoporosis   . Allergy     allergic rhinitis  . COPD (chronic obstructive pulmonary disease) (HCC)     congenital Lung Deformity//Scar tissue on lung( cxr) after surgery  )    Patient Active Problem List   Diagnosis Date Noted  . Protein-calorie malnutrition, severe (HCC) 07/19/2015  . Altered mental status 07/18/2015  . Encounter for Medicare annual wellness exam 03/25/2013  . Stress reaction 03/07/2013  . Restless legs syndrome (RLS) 11/25/2012  . Staring spell 11/25/2012  . Hoarseness, chronic 09/25/2012  . Other screening mammogram 09/25/2011  . Varicose veins 09/25/2011  . EPISTAXIS 01/20/2011  . HYPOGLYCEMIA, UNSPECIFIED 11/12/2008  . Seasonal and perennial allergic rhinitis 09/25/2007  . HYPERLIPIDEMIA 04/30/2007  . HYPERTENSION 04/30/2007  . COPD 04/30/2007  . GERD 04/30/2007    Past Surgical History  Procedure Laterality Date  . Appendectomy    . Eye surgery      cataract extraction, bilateral  . Abdominal hysterectomy      partial ; bleeding  . Lung removal, partial  1988    Lung -wedge resection/s/p sequestration (Has scar tissue on cxr now)  . Shoulder surgery  2000-2001    Bilaterial  . Foot ganglion excision    .  Fracture surgery  2008    left shoulder   . Fracture left shoulder  2008  . Cystoscopy  1993    bladder diverticulum  . Ct lung screening  12/2004    right basilor ? mass// Lung CT stable 05/2005    Current Outpatient Rx  Name  Route  Sig  Dispense  Refill  . acetaminophen (TYLENOL) 325 MG tablet   Oral   Take 2 tablets (650 mg total) by mouth every 6 (six) hours as needed for mild pain, fever or headache.   30 tablet   0   . bisacodyl (DULCOLAX) 10 MG suppository   Rectal   Place 1 suppository (10 mg total) rectally daily as needed for moderate constipation.   12 suppository   0   . carbidopa-levodopa (SINEMET IR) 10-100 MG per tablet   Oral   Take 1 tablet by mouth 3 (three) times daily.         . Carboxymethylcell-Hypromellose (GENTEAL) 0.25-0.3 % GEL   Ophthalmic   Apply 1 application to eye at bedtime.         . clonazePAM (KLONOPIN) 0.5 MG tablet      One tab po qhs prn severe insomnia   5 tablet   1   . cloNIDine (CATAPRES) 0.1 MG tablet   Oral   Take 0.1 mg by mouth daily.         Marland Kitchen loratadine (CLARITIN) 10 MG tablet  Oral   Take 1 tablet (10 mg total) by mouth daily as needed for allergies.   20 tablet   0   . losartan (COZAAR) 50 MG tablet      TAKE 1 TABLET BY MOUTH EVERY DAY Patient taking differently: Take 50 mg by mouth daily.    30 tablet   2   . Morphine Sulfate (MORPHINE CONCENTRATE) 10 mg / 0.5 ml concentrated solution   Oral   Take 0.13 mLs (2.6 mg total) by mouth every 4 (four) hours as needed for moderate pain.   30 mL   0   . Morphine Sulfate (MORPHINE CONCENTRATE) 10 mg / 0.5 ml concentrated solution   Oral   Take 0.25 mLs (5 mg total) by mouth every 2 (two) hours as needed for severe pain or shortness of breath.   30 mL   0   . omeprazole (PRILOSEC) 20 MG capsule      TAKE 1 CAPSULE (20 MG TOTAL) BY MOUTH AT BEDTIME. Patient taking differently: Take 20 mg by mouth at bedtime.    30 capsule   0   . ondansetron  (ZOFRAN) 4 MG tablet   Oral   Take 4 mg by mouth every 6 (six) hours as needed for nausea or vomiting.         Bertram Gala. Polyethyl Glycol-Propyl Glycol (SYSTANE) 0.4-0.3 % SOLN   Ophthalmic   Apply 1 drop to eye 4 (four) times daily.         . pramipexole (MIRAPEX) 0.125 MG tablet      1 in AM, 2 po q hs   90 tablet   5   . sennosides-docusate sodium (SENOKOT-S) 8.6-50 MG tablet   Oral   Take 1 tablet by mouth 2 (two) times daily as needed for constipation.   20 tablet   0   . sertraline (ZOLOFT) 50 MG tablet      Take 50 mg po daily for 2 days then 25 mg po daily for 6 days then 25 mg po every other day for 6 doses then 25 mg every 4th day for 4 doses then stop.   10 tablet   0     Allergies Ace inhibitors; Amlodipine; Codeine; Klonopin; Penicillins; Requip; and Sulfonamide derivatives  Family History  Problem Relation Age of Onset  . Cancer Mother     breast  . Hypertension Mother   . Stroke Mother   . Heart disease Father     MI  . Hyperlipidemia Brother   . Hypertension Brother     Social History Social History  Substance Use Topics  . Smoking status: Never Smoker   . Smokeless tobacco: Never Used  . Alcohol Use: No    Review of Systems Patient is unable to provide a review of systems due to severe dementia. ____________________________________________   PHYSICAL EXAM:  VITAL SIGNS: ED Triage Vitals  Enc Vitals Group     BP --      Pulse --      Resp 03/09/16 1455 16     Temp 03/09/16 1455 97.9 F (36.6 C)     Temp Source 03/09/16 1455 Oral     SpO2 --      Weight 03/09/16 1455 80 lb (36.288 kg)     Height 03/09/16 1455 5\' 1"  (1.549 m)     Head Cir --      Peak Flow --      Pain Score --      Pain  Loc --      Pain Edu? --      Excl. in GC? --    Constitutional: Alert. No distress. Thin/frail in appearance. Eyes: Normal exam ENT   Head: 2 cm laceration to left parietal scalp   Mouth/Throat: Mucous membranes are  moist. Cardiovascular: Normal rate, regular rhythm. Respiratory: Normal respiratory effort without tachypnea nor retractions. Breath sounds are clear  Gastrointestinal: Soft and nontender. No distention.  Musculoskeletal: Patient has an approximate 6 cm laceration to her left hand, good range of motion in wrist, able to squeeze hand, extremity is otherwise largely atraumatic, good range of motion in all joints, stable pelvis with normal range of motion in hips without apparent pain. Neurologic:  Normal speech and language. No gross focal neurologic deficits  Skin:  Skin is warm, dry and intact.  Psychiatric: Mood and affect are normal.  ____________________________________________   RADIOLOGY  CT scan show no acute abnormality  ____________________________________________   INITIAL IMPRESSION / ASSESSMENT AND PLAN / ED COURSE  Pertinent labs & imaging results that were available during my care of the patient were reviewed by me and considered in my medical decision making (see chart for details).  Patient presents the emergency department after a fall from her nursing home. Laceration in the scalp has been irrigated and repaired with 2 staples. Left hand laceration has been irrigated, the skin has been reapproximated to cover the exposed wound. We will obtain a CT head and C-spine to rule out intracranial or C-spine abnormality. Given the degree of laceration to left hand with exposed tendons, even though they have been closed we will cover with antibiotics.   LACERATION REPAIR Performed by: Minna Antis Authorized by: Minna Antis Consent: Verbal consent obtained. Risks and benefits: risks, benefits and alternatives were discussed Consent given by: patient Patient identity confirmed: provided demographic data Prepped and Draped in normal sterile fashion Wound explored  Laceration Location: left hand, dorsal aspect  Laceration Length: 6cm  No Foreign Bodies seen  or palpated  Anesthesia: local infiltration  Local anesthetic: lidocaine 1% w/ epinephrine  Anesthetic total: 6 ml  Irrigation method: syringe Amount of cleaning: standard  Skin closure: 4-0 Nylon   Number of sutures: 7  Technique: simple interrupted  Patient tolerance: Patient tolerated the procedure well with no immediate complications.  LACERATION REPAIR Performed by: Minna Antis Authorized by: Minna Antis Consent: Verbal consent obtained. Risks and benefits: risks, benefits and alternatives were discussed Consent given by: patient Patient identity confirmed: provided demographic data Prepped and Draped in normal sterile fashion Wound explored  Laceration Location: Left parietal scalp  Laceration Length: 2 cm  No Foreign Bodies seen or palpated  Anesthesia: local infiltration  Local anesthetic: lidocaine 1 % with epinephrine  Anesthetic total: 3 ml  Irrigation method: syringe Amount of cleaning: standard  Skin closure: Staples   Number of staples: 2   Patient tolerance: Patient tolerated the procedure well with no immediate complications.    CT scan show no acute abnormality we'll discharge back to her nursing facility. ____________________________________________   FINAL CLINICAL IMPRESSION(S) / ED DIAGNOSES  Laceration Thresa Ross, MD 03/09/16 [phone]

## 2016-03-09 NOTE — Progress Notes (Signed)
ED visit made. Patient is currently followed by [facility] with a hospice diagnosis of protein calorie malnutrition. She is a DNR code and has a MOST form in place. Beth Roberts was sent [facility] ED via EMS for evaluation following a fall. Patient was in a procedure at time of visit. Writer spoke to attending physician Dr. Lenard LancePaduchowski and staff RN Darl PikesSusan. Patient has stitches to the skin tear to her left hand and 2 staples applied to her head laceration. Plan is for discharge back to [facility] pending head CT results. Hospice team updated. Thank you. Dayna BarkerKaren Robertson RN, BSN, [facility], [facility] [phone] c

## 2016-03-09 NOTE — Discharge Instructions (Signed)
You have been seen in the emergency department for a fall and 2 lacerations. You have suffered a laceration to your head which has been repaired with 2 staples. These will need to be removed in 7-10 days. Please keep the area clean, and cover with bacitracin but you do not need to keep this area covered with a bandage. You have also suffered a laceration to her left hand. This has been repaired with 7 sutures. These will also need to be removed in 7-10 days. Please change the bandage once daily and keep covered. Due to the large laceration you have been prescribed an antibiotic please take the next 7 days as prescribed. Follow-up with your primary care physician in 2-3 days for recheck/reevaluation and in 7-10 days for suture and staple removal.   Fall  As a hospital patient, your condition and the treatments you receive can increase your risk for falls. Some additional risk factors for falls in a hospital include:  Being in an unfamiliar environment.  Being on bed rest.  Your surgery.  Taking certain medicines.  Your tubing requirements, such as intravenous (IV) therapy or catheters. It is important that you learn how to decrease fall risks while at the hospital. Below are important tips that can help prevent falls. SAFETY TIPS FOR PREVENTING FALLS Talk about your risk of falling.  Ask your health care provider why you are at risk for falling. Is it your medicine, illness, tubing placement, or something else?  Make a plan with your health care provider to keep you safe from falls.  Ask your health care provider or pharmacist about side effects of your medicines. Some medicines can make you dizzy or affect your coordination. Ask for help.  Ask for help before getting out of bed. You may need to press your call button.  Ask for assistance in getting safely to the toilet.  Ask for a walker or cane to be put at your bedside. Ask that most of the side rails on your bed be placed up before  your health care provider leaves the room.  Ask family or friends to sit with you.  Ask for things that are out of your reach, such as your glasses, hearing aids, telephone, bedside table, or call button. Follow these tips to avoid falling:  Stay lying or seated, rather than standing, while waiting for help.  Wear rubber-soled slippers or shoes whenever you walk in the hospital.  Avoid quick, sudden movements.  Change positions slowly.  Sit on the side of your bed before standing.  Stand up slowly and wait before you start to walk.  Let your health care provider know if there is a spill on the floor.  Pay careful attention to the medical equipment, electrical cords, and tubes around you.  When you need help, use your call button by your bed or in the bathroom. Wait for one of your health care providers to help you.  If you feel dizzy or unsure of your footing, return to bed and wait for assistance.  Avoid being distracted by the TV, telephone, or another person in your room.  Do not lean or support yourself on rolling objects, such as IV poles or bedside tables.   This information is not intended to replace advice given to you by your health care provider. Make sure you discuss any questions you have with your health care provider.   Document Released: 10/20/2000 Document Revised: 11/13/2014 Document Reviewed: 06/30/2012 Elsevier Interactive Patient Education 2016 Elsevier  Inc. ° °

## 2016-06-07 ENCOUNTER — Other Ambulatory Visit
Admission: RE | Admit: 2016-06-07 | Discharge: 2016-06-07 | Disposition: A | Discharge status: Home / Self Care | Source: Skilled Nursing Facility | Attending: Family Medicine | Admitting: Family Medicine

## 2016-06-07 DIAGNOSIS — Z1389 Encounter for screening for other disorder: Secondary | ICD-10-CM | POA: Insufficient documentation

## 2016-06-09 LAB — LEGIONELLA PNEUMOPHILA SEROGP 1 UR AG: L. PNEUMOPHILA SEROGP 1 UR AG: NEGATIVE

## 2016-07-05 ENCOUNTER — Other Ambulatory Visit
Admission: RE | Admit: 2016-07-05 | Discharge: 2016-07-05 | Disposition: A | Discharge status: Home / Self Care | Payer: Medicare Other | Source: Ambulatory Visit | Attending: Family Medicine | Admitting: Family Medicine

## 2016-07-05 DIAGNOSIS — D649 Anemia, unspecified: Secondary | ICD-10-CM | POA: Diagnosis present

## 2016-07-05 LAB — URINALYSIS COMPLETE WITH MICROSCOPIC (ARMC ONLY)
BILIRUBIN URINE: NEGATIVE
Bacteria, UA: NONE SEEN
GLUCOSE, UA: NEGATIVE mg/dL
HGB URINE DIPSTICK: NEGATIVE
KETONES UR: NEGATIVE mg/dL
LEUKOCYTES UA: NEGATIVE
Nitrite: NEGATIVE
PH: 7 (ref 5.0–8.0)
Protein, ur: 100 mg/dL — AB
Specific Gravity, Urine: 1.021 (ref 1.005–1.030)

## 2016-07-06 LAB — LEGIONELLA PNEUMOPHILA SEROGP 1 UR AG: L. pneumophila Serogp 1 Ur Ag: NEGATIVE

## 2016-07-06 LAB — URINE CULTURE: Culture: <10000 — AB

## 2016-10-04 ENCOUNTER — Emergency Department

## 2016-10-04 ENCOUNTER — Encounter: Payer: Self-pay | Admitting: *Deleted

## 2016-10-04 ENCOUNTER — Emergency Department
Admission: EM | Admit: 2016-10-04 | Discharge: 2016-10-04 | Disposition: A | Discharge status: Home / Self Care | Attending: Emergency Medicine | Admitting: Emergency Medicine

## 2016-10-04 DIAGNOSIS — Y9389 Activity, other specified: Secondary | ICD-10-CM | POA: Insufficient documentation

## 2016-10-04 DIAGNOSIS — I10 Essential (primary) hypertension: Secondary | ICD-10-CM | POA: Insufficient documentation

## 2016-10-04 DIAGNOSIS — W1800XA Striking against unspecified object with subsequent fall, initial encounter: Secondary | ICD-10-CM | POA: Insufficient documentation

## 2016-10-04 DIAGNOSIS — G20 Parkinson's disease: Secondary | ICD-10-CM | POA: Insufficient documentation

## 2016-10-04 DIAGNOSIS — Z23 Encounter for immunization: Secondary | ICD-10-CM | POA: Insufficient documentation

## 2016-10-04 DIAGNOSIS — S0101XA Laceration without foreign body of scalp, initial encounter: Secondary | ICD-10-CM | POA: Insufficient documentation

## 2016-10-04 DIAGNOSIS — Y92129 Unspecified place in nursing home as the place of occurrence of the external cause: Secondary | ICD-10-CM | POA: Insufficient documentation

## 2016-10-04 DIAGNOSIS — R296 Repeated falls: Secondary | ICD-10-CM

## 2016-10-04 DIAGNOSIS — J449 Chronic obstructive pulmonary disease, unspecified: Secondary | ICD-10-CM | POA: Insufficient documentation

## 2016-10-04 DIAGNOSIS — F0390 Unspecified dementia without behavioral disturbance: Secondary | ICD-10-CM | POA: Insufficient documentation

## 2016-10-04 DIAGNOSIS — Z79899 Other long term (current) drug therapy: Secondary | ICD-10-CM | POA: Insufficient documentation

## 2016-10-04 DIAGNOSIS — Y999 Unspecified external cause status: Secondary | ICD-10-CM | POA: Insufficient documentation

## 2016-10-04 HISTORY — DX: Parkinson's disease without dyskinesia, without mention of fluctuations: G20.A1

## 2016-10-04 HISTORY — DX: Unspecified dementia, unspecified severity, without behavioral disturbance, psychotic disturbance, mood disturbance, and anxiety: F03.90

## 2016-10-04 HISTORY — DX: Parkinson's disease: G20

## 2016-10-04 LAB — GLUCOSE, CAPILLARY: Glucose-Capillary: 74 mg/dL (ref 65–99)

## 2016-10-04 MED ORDER — TETANUS-DIPHTH-ACELL PERTUSSIS 5-2.5-18.5 LF-MCG/0.5 IM SUSP
0.5000 mL | Freq: Once | INTRAMUSCULAR | Status: AC
  Administered 2016-10-04: 0.5 mL via INTRAMUSCULAR
  Filled 2016-10-04: qty 0.5

## 2016-10-04 NOTE — ED Provider Notes (Signed)
[facility] Emergency Department Provider Note  ____________________________________________  Time seen: Approximately 4:41 PM  I have reviewed the triage vital signs and the nursing notes.   HISTORY  Chief Complaint Head injury  Level 5 caveat:  Portions of the history and physical were unable to be obtained due to the patient's altered mental status due to advanced chronic dementia.  HPI Beth Roberts is a 82 y.o. female brought to the ED by EMS from nursing home due to an unwitnessed fall resulting in a head injury on the back of the head. Patient has chronic dementia and Parkinson's.  Patient denies any complaints. Denies pain anywhere.     Past Medical History:  Diagnosis Date  . Allergy    allergic rhinitis  . COPD (chronic obstructive pulmonary disease) (HCC)    congenital Lung Deformity//Scar tissue on lung( cxr) after surgery  )  . GERD (gastroesophageal reflux disease)    hiatal henia  . Hyperlipidemia   . Hypertension   . Osteoporosis      Patient Active Problem List   Diagnosis Date Noted  . Protein-calorie malnutrition, severe (HCC) 07/19/2015  . Altered mental status 07/18/2015  . Encounter for Medicare annual wellness exam 03/25/2013  . Stress reaction 03/07/2013  . Restless legs syndrome (RLS) 11/25/2012  . Staring spell 11/25/2012  . Hoarseness, chronic 09/25/2012  . Other screening mammogram 09/25/2011  . Varicose veins 09/25/2011  . EPISTAXIS 01/20/2011  . HYPOGLYCEMIA, UNSPECIFIED 11/12/2008  . Seasonal and perennial allergic rhinitis 09/25/2007  . HYPERLIPIDEMIA 04/30/2007  . HYPERTENSION 04/30/2007  . COPD 04/30/2007  . GERD 04/30/2007     Past Surgical History:  Procedure Laterality Date  . ABDOMINAL HYSTERECTOMY     partial ; bleeding  . APPENDECTOMY    . CT LUNG SCREENING  12/2004   right basilor ? mass// Lung CT stable 05/2005  . CYSTOSCOPY  1993   bladder diverticulum  . EYE SURGERY     cataract  extraction, bilateral  . FOOT GANGLION EXCISION    . fracture left shoulder  2008  . FRACTURE SURGERY  2008   left shoulder   . LUNG REMOVAL, PARTIAL  1988   Lung -wedge resection/s/p sequestration (Has scar tissue on cxr now)  . SHOULDER SURGERY  2000-2001   Bilaterial     Prior to Admission medications   Medication Sig Start Date End Date Taking? Authorizing Provider  acetaminophen (TYLENOL) 325 MG tablet Take 2 tablets (650 mg total) by mouth every 6 (six) hours as needed for mild pain, fever or headache. 07/20/15   Suan HalterMargaret F Campbell, MD  bisacodyl (DULCOLAX) 10 MG suppository Place 1 suppository (10 mg total) rectally daily as needed for moderate constipation. 07/20/15   Suan HalterMargaret F Campbell, MD  carbidopa-levodopa (SINEMET IR) 10-100 MG per tablet Take 1 tablet by mouth 3 (three) times daily.    Historical Provider, MD  Carboxymethylcell-Hypromellose (GENTEAL) 0.25-0.3 % GEL Apply 1 application to eye at bedtime.    Historical Provider, MD  cephALEXin (KEFLEX) 500 MG capsule Take 1 capsule (500 mg total) by mouth 3 (three) times daily. 03/09/16   Minna AntisKevin Paduchowski, MD  clonazePAM Scarlette Calico(KLONOPIN) 0.5 MG tablet One tab po qhs prn severe insomnia 07/20/15   Suan HalterMargaret F Campbell, MD  cloNIDine (CATAPRES) 0.1 MG tablet Take 0.1 mg by mouth daily.    Historical Provider, MD  loratadine (CLARITIN) 10 MG tablet Take 1 tablet (10 mg total) by mouth daily as needed for allergies. 07/20/15   Claris CheMargaret  Luther HearingF Campbell, MD  losartan (COZAAR) 50 MG tablet TAKE 1 TABLET BY MOUTH EVERY DAY Patient taking differently: Take 50 mg by mouth daily.  09/09/13   Judy PimpleMarne A Tower, MD  Morphine Sulfate (MORPHINE CONCENTRATE) 10 mg / 0.5 ml concentrated solution Take 0.13 mLs (2.6 mg total) by mouth every 4 (four) hours as needed for moderate pain. 07/20/15   Suan HalterMargaret F Campbell, MD  Morphine Sulfate (MORPHINE CONCENTRATE) 10 mg / 0.5 ml concentrated solution Take 0.25 mLs (5 mg total) by mouth every 2 (two) hours as needed for  severe pain or shortness of breath. 07/20/15   Suan HalterMargaret F Campbell, MD  omeprazole (PRILOSEC) 20 MG capsule TAKE 1 CAPSULE (20 MG TOTAL) BY MOUTH AT BEDTIME. Patient taking differently: Take 20 mg by mouth at bedtime.  09/30/13   Judy PimpleMarne A Tower, MD  ondansetron (ZOFRAN) 4 MG tablet Take 4 mg by mouth every 6 (six) hours as needed for nausea or vomiting.    Historical Provider, MD  Polyethyl Glycol-Propyl Glycol (SYSTANE) 0.4-0.3 % SOLN Apply 1 drop to eye 4 (four) times daily.    Historical Provider, MD  pramipexole (MIRAPEX) 0.125 MG tablet 1 in AM, 2 po q hs 06/05/13   Rebecca S Tat, DO  sennosides-docusate sodium (SENOKOT-S) 8.6-50 MG tablet Take 1 tablet by mouth 2 (two) times daily as needed for constipation. 07/20/15   Suan HalterMargaret F Campbell, MD  sertraline (ZOLOFT) 50 MG tablet Take 50 mg po daily for 2 days then 25 mg po daily for 6 days then 25 mg po every other day for 6 doses then 25 mg every 4th day for 4 doses then stop. 07/20/15   Suan HalterMargaret F Campbell, MD     Allergies Ace inhibitors; Amlodipine; Codeine; Klonopin [clonazepam]; Penicillins; Requip [ropinirole hcl]; and Sulfonamide derivatives   Family History  Problem Relation Age of Onset  . Cancer Mother     breast  . Hypertension Mother   . Stroke Mother   . Heart disease Father     MI  . Hyperlipidemia Brother   . Hypertension Brother     Social History Social History  Substance Use Topics  . Smoking status: Never Smoker  . Smokeless tobacco: Never Used  . Alcohol use No    Review of Systems Unable to reliably obtained due to altered mental status and dementia  ____________________________________________   PHYSICAL EXAM:  VITAL SIGNS: ED Triage Vitals  Enc Vitals Group     BP      Pulse      Resp      Temp      Temp src      SpO2      Weight      Height      Head Circumference      Peak Flow      Pain Score      Pain Loc      Pain Edu?      Excl. in GC?     Vital signs reviewed, nursing  assessments reviewed.   Constitutional:   Alert and orientedTo self. Well appearing and in no distress. Eyes:   No scleral icterus. No conjunctival pallor. PERRL. EOMI.  No nystagmus. ENT   Head:   Normocephalic with 2 cm linear scalp laceration on the posterior parietal scalp of the left. Hemostatic.   Nose:   No congestion/rhinnorhea. No septal hematoma   Mouth/Throat:   MMM, no pharyngeal erythema. No peritonsillar mass.    Neck:  No stridor. No SubQ emphysema. No meningismus. Hematological/Lymphatic/Immunilogical:   No cervical lymphadenopathy. Cardiovascular:   RRR. Symmetric bilateral radial and DP pulses.  No murmurs.  Respiratory:   Normal respiratory effort without tachypnea nor retractions. Breath sounds are clear and equal bilaterally. No wheezes/rales/rhonchi. Gastrointestinal:   Soft and nontender. Non distended. There is no CVA tenderness.  No rebound, rigidity, or guarding. Genitourinary:   deferred Musculoskeletal:   Nontender with normal range of motion in all extremities. No joint effusions.  No lower extremity tenderness.  No edema. Neurologic:   Limited speech, repeats statements made by others..  CN 2-10 normal. Cogwheel rigidity. No gross focal neurologic deficits are appreciated.  Skin:    Skin is warm, dry and intact. No rash noted.  No petechiae, purpura, or bullae.  ____________________________________________    LABS (pertinent positives/negatives) (all labs ordered are listed, but only abnormal results are displayed) Labs Reviewed  CBG MONITORING, ED   ____________________________________________   EKG  Interpreted by me Sinus rhythm rate of 82, normal axis and intervals. Poor R-wave progression in anterior precordial leads. Normal ST segments and T waves.  ____________________________________________    RADIOLOGY  CT head unremarkable CT cervical spine  unremarkable  ____________________________________________   PROCEDURES Procedures LACERATION REPAIR Performed by: Scotty Court, Cayci Mcnabb Authorized by: Sharman Cheek Consent: Verbal consent obtained. Risks and benefits: risks, benefits and alternatives were discussed Consent given by: patient Patient identity confirmed: provided demographic data Prepped and Draped in normal sterile fashion Wound explored  Laceration Location: Left posterior parietal scalp  Laceration Length: 2cm  No Foreign Bodies seen or palpated  Anesthesia: None   Amount of cleaning: standard  Skin closure: Staples   Number of staples: 2 Technique: Staple gun   Patient tolerance: Patient tolerated the procedure well with no immediate complications.  ____________________________________________   INITIAL IMPRESSION / ASSESSMENT AND PLAN / ED COURSE  Pertinent labs & imaging results that were available during my care of the patient were reviewed by me and considered in my medical decision making (see chart for details).  Patient well appearing no acute distress, presents likely chronic baseline with severe dementia after a fall resulting in head injury. Small laceration is hemostatic, no evidence of any other injuries. Laceration was repaired. CT head and neck, EKG and fingerstick. If these screening measures are unremarkable the patient will be suitable for discharge home for outpatient follow-up.    ----------------------------------------- 5:34 PM on 10/04/2016 -----------------------------------------  Workup unremarkable. Fingerstick normal. We'll discharge home. Clinical Course    ____________________________________________   FINAL CLINICAL IMPRESSION(S) / ED DIAGNOSES  Final diagnoses:  Unwitnessed fall  Chronic dementia without behavioral disturbance  Scalp laceration, initial encounter       Portions of this note were generated with dragon dictation software. Dictation  errors may occur despite best attempts at proofreading.    Sharman Cheek, MD 10/04/16 [phone]

## 2016-10-04 NOTE — ED Notes (Signed)
Patient's daughter elected to take the patient to [facility] by private car due to the wait for ambulance transport. Paperwork was given to the daughter to take to [facility]. Patient left via WC.

## 2016-10-04 NOTE — ED Notes (Signed)
Patient taken to imaging. 

## 2016-10-04 NOTE — ED Notes (Signed)
EMS was called for transport at 1827. Patient going to [facility].

## 2016-10-04 NOTE — ED Triage Notes (Addendum)
Per report, patient is from [facility], fell today and has lacertion on head. No LOC reported by staff and patient is at baseline confusion. Per daughter, patient was trying to sit down and the chair rolled away and the patient fell and hit the back of her head.

## 2017-05-18 ENCOUNTER — Emergency Department

## 2017-05-18 ENCOUNTER — Encounter: Payer: Self-pay | Admitting: Emergency Medicine

## 2017-05-18 ENCOUNTER — Inpatient Hospital Stay
Admission: EM | Admit: 2017-05-18 | Discharge: 2017-05-22 | DRG: 480 | Disposition: A | Discharge status: Skilled Nursing Facility | Attending: Internal Medicine | Admitting: Internal Medicine

## 2017-05-18 DIAGNOSIS — W1830XA Fall on same level, unspecified, initial encounter: Secondary | ICD-10-CM | POA: Diagnosis present

## 2017-05-18 DIAGNOSIS — Y92128 Other place in nursing home as the place of occurrence of the external cause: Secondary | ICD-10-CM

## 2017-05-18 DIAGNOSIS — N3000 Acute cystitis without hematuria: Secondary | ICD-10-CM | POA: Diagnosis present

## 2017-05-18 DIAGNOSIS — G20A1 Parkinson's disease without dyskinesia, without mention of fluctuations: Secondary | ICD-10-CM | POA: Diagnosis present

## 2017-05-18 DIAGNOSIS — E785 Hyperlipidemia, unspecified: Secondary | ICD-10-CM | POA: Diagnosis present

## 2017-05-18 DIAGNOSIS — S72142A Displaced intertrochanteric fracture of left femur, initial encounter for closed fracture: Secondary | ICD-10-CM | POA: Diagnosis present

## 2017-05-18 DIAGNOSIS — I10 Essential (primary) hypertension: Secondary | ICD-10-CM | POA: Diagnosis present

## 2017-05-18 DIAGNOSIS — G20 Parkinson's disease: Secondary | ICD-10-CM | POA: Diagnosis present

## 2017-05-18 DIAGNOSIS — J449 Chronic obstructive pulmonary disease, unspecified: Secondary | ICD-10-CM | POA: Diagnosis present

## 2017-05-18 DIAGNOSIS — Z681 Body mass index (BMI) 19 or less, adult: Secondary | ICD-10-CM

## 2017-05-18 DIAGNOSIS — Z66 Do not resuscitate: Secondary | ICD-10-CM | POA: Diagnosis present

## 2017-05-18 DIAGNOSIS — Z90710 Acquired absence of both cervix and uterus: Secondary | ICD-10-CM

## 2017-05-18 DIAGNOSIS — Z8249 Family history of ischemic heart disease and other diseases of the circulatory system: Secondary | ICD-10-CM | POA: Diagnosis not present

## 2017-05-18 DIAGNOSIS — E43 Unspecified severe protein-calorie malnutrition: Secondary | ICD-10-CM | POA: Diagnosis present

## 2017-05-18 DIAGNOSIS — S0083XA Contusion of other part of head, initial encounter: Secondary | ICD-10-CM

## 2017-05-18 DIAGNOSIS — K219 Gastro-esophageal reflux disease without esophagitis: Secondary | ICD-10-CM | POA: Diagnosis present

## 2017-05-18 DIAGNOSIS — Z823 Family history of stroke: Secondary | ICD-10-CM | POA: Diagnosis not present

## 2017-05-18 DIAGNOSIS — T148XXA Other injury of unspecified body region, initial encounter: Secondary | ICD-10-CM

## 2017-05-18 DIAGNOSIS — L8990 Pressure ulcer of unspecified site, unspecified stage: Secondary | ICD-10-CM | POA: Insufficient documentation

## 2017-05-18 DIAGNOSIS — M25552 Pain in left hip: Secondary | ICD-10-CM | POA: Diagnosis present

## 2017-05-18 DIAGNOSIS — S72002A Fracture of unspecified part of neck of left femur, initial encounter for closed fracture: Secondary | ICD-10-CM

## 2017-05-18 DIAGNOSIS — M810 Age-related osteoporosis without current pathological fracture: Secondary | ICD-10-CM | POA: Diagnosis present

## 2017-05-18 DIAGNOSIS — Z993 Dependence on wheelchair: Secondary | ICD-10-CM

## 2017-05-18 DIAGNOSIS — F0280 Dementia in other diseases classified elsewhere without behavioral disturbance: Secondary | ICD-10-CM | POA: Diagnosis present

## 2017-05-18 DIAGNOSIS — W19XXXA Unspecified fall, initial encounter: Secondary | ICD-10-CM

## 2017-05-18 DIAGNOSIS — D62 Acute posthemorrhagic anemia: Secondary | ICD-10-CM | POA: Diagnosis not present

## 2017-05-18 LAB — BASIC METABOLIC PANEL
ANION GAP: 8 (ref 5–15)
BUN: 33 mg/dL — ABNORMAL HIGH (ref 6–20)
CALCIUM: 8.9 mg/dL (ref 8.9–10.3)
CO2: 24 mmol/L (ref 22–32)
Chloride: 105 mmol/L (ref 101–111)
Creatinine, Ser: 0.79 mg/dL (ref 0.44–1.00)
GLUCOSE: 165 mg/dL — AB (ref 65–99)
POTASSIUM: 4.1 mmol/L (ref 3.5–5.1)
SODIUM: 137 mmol/L (ref 135–145)

## 2017-05-18 LAB — PROTIME-INR
INR: 1.08
PROTHROMBIN TIME: 14 s (ref 11.4–15.2)

## 2017-05-18 LAB — MRSA PCR SCREENING: MRSA by PCR: NEGATIVE

## 2017-05-18 LAB — CBC
HCT: 29.6 % — ABNORMAL LOW (ref 35.0–47.0)
Hemoglobin: 10.5 g/dL — ABNORMAL LOW (ref 12.0–16.0)
MCH: 35.3 pg — ABNORMAL HIGH (ref 26.0–34.0)
MCHC: 35.3 g/dL (ref 32.0–36.0)
MCV: 100 fL (ref 80.0–100.0)
PLATELETS: 387 10*3/uL (ref 150–440)
RBC: 2.96 MIL/uL — AB (ref 3.80–5.20)
RDW: 13.2 % (ref 11.5–14.5)
WBC: 19 10*3/uL — AB (ref 3.6–11.0)

## 2017-05-18 LAB — APTT: APTT: 26 s (ref 24–36)

## 2017-05-18 MED ORDER — PANTOPRAZOLE SODIUM 40 MG PO TBEC
40.0000 mg | DELAYED_RELEASE_TABLET | Freq: Every day | ORAL | Status: DC
Start: 2017-05-19 — End: 2017-05-22
  Administered 2017-05-20 – 2017-05-22 (×3): 40 mg via ORAL
  Filled 2017-05-18 (×3): qty 1

## 2017-05-18 MED ORDER — SODIUM CHLORIDE 0.9 % IV SOLN
Freq: Once | INTRAVENOUS | Status: AC
  Administered 2017-05-18: 1000 mL via INTRAVENOUS

## 2017-05-18 MED ORDER — ACETAMINOPHEN 325 MG PO TABS
650.0000 mg | ORAL_TABLET | Freq: Four times a day (QID) | ORAL | Status: DC | PRN
  Administered 2017-05-20 – 2017-05-22 (×4): 650 mg via ORAL
  Filled 2017-05-18 (×4): qty 2

## 2017-05-18 MED ORDER — POLYETHYL GLYCOL-PROPYL GLYCOL 0.4-0.3 % OP SOLN
1.0000 [drp] | Freq: Four times a day (QID) | OPHTHALMIC | Status: DC
  Administered 2017-05-18 – 2017-05-22 (×10): 1 [drp] via OPHTHALMIC
  Filled 2017-05-18 (×4): qty 1

## 2017-05-18 MED ORDER — ACETAMINOPHEN 650 MG RE SUPP: 650.0000 mg | Freq: Four times a day (QID) | RECTAL | Status: DC | PRN

## 2017-05-18 MED ORDER — ONDANSETRON HCL 4 MG/2ML IJ SOLN: 4.0000 mg | Freq: Four times a day (QID) | INTRAMUSCULAR | Status: DC | PRN

## 2017-05-18 MED ORDER — CLONAZEPAM 0.5 MG PO TABS: 0.5000 mg | ORAL_TABLET | Freq: Every evening | ORAL | Status: DC | PRN

## 2017-05-18 MED ORDER — ONDANSETRON HCL 4 MG PO TABS: 4.0000 mg | ORAL_TABLET | Freq: Four times a day (QID) | ORAL | Status: DC | PRN

## 2017-05-18 MED ORDER — CLINDAMYCIN PHOSPHATE 600 MG/50ML IV SOLN
600.0000 mg | Freq: Once | INTRAVENOUS | Status: AC
  Administered 2017-05-19: 600 mg via INTRAVENOUS
  Filled 2017-05-18: qty 50

## 2017-05-18 MED ORDER — MORPHINE SULFATE (PF) 2 MG/ML IV SOLN: 2.0000 mg | INTRAVENOUS | Status: DC | PRN

## 2017-05-18 MED ORDER — SENNOSIDES-DOCUSATE SODIUM 8.6-50 MG PO TABS: 1.0000 | ORAL_TABLET | Freq: Two times a day (BID) | ORAL | Status: DC | PRN

## 2017-05-18 MED ORDER — PRAMIPEXOLE DIHYDROCHLORIDE 0.25 MG PO TABS
0.1250 mg | ORAL_TABLET | Freq: Two times a day (BID) | ORAL | Status: DC
  Administered 2017-05-19 – 2017-05-21 (×4): 0.125 mg via ORAL
  Filled 2017-05-18 (×4): qty 1

## 2017-05-18 MED ORDER — CLONIDINE HCL 0.1 MG PO TABS
0.1000 mg | ORAL_TABLET | Freq: Every day | ORAL | Status: DC
  Administered 2017-05-20 – 2017-05-22 (×3): 0.1 mg via ORAL
  Filled 2017-05-18 (×3): qty 1

## 2017-05-18 MED ORDER — FENTANYL CITRATE (PF) 100 MCG/2ML IJ SOLN
25.0000 ug | Freq: Once | INTRAMUSCULAR | Status: AC
  Administered 2017-05-18: 25 ug via INTRAVENOUS
  Filled 2017-05-18: qty 2

## 2017-05-18 MED ORDER — SODIUM CHLORIDE 0.9 % IV SOLN
INTRAVENOUS | Status: AC
  Administered 2017-05-18: 23:00:00 via INTRAVENOUS

## 2017-05-18 MED ORDER — OXYCODONE HCL 5 MG PO TABS
5.0000 mg | ORAL_TABLET | ORAL | Status: DC | PRN
  Administered 2017-05-20: 5 mg via ORAL
  Filled 2017-05-18: qty 1

## 2017-05-18 MED ORDER — LOSARTAN POTASSIUM 50 MG PO TABS
50.0000 mg | ORAL_TABLET | Freq: Every day | ORAL | Status: DC
  Administered 2017-05-20 – 2017-05-22 (×3): 50 mg via ORAL
  Filled 2017-05-18 (×3): qty 1

## 2017-05-18 MED ORDER — CARBIDOPA-LEVODOPA 10-100 MG PO TABS
1.0000 | ORAL_TABLET | Freq: Three times a day (TID) | ORAL | Status: DC
  Administered 2017-05-19 – 2017-05-22 (×8): 1 via ORAL
  Filled 2017-05-18 (×13): qty 1

## 2017-05-18 NOTE — ED Provider Notes (Addendum)
[facility] Emergency Department Provider Note  ____________________________________________  Time seen: Approximately 7:22 PM  I have reviewed the triage vital signs and the nursing notes.   HISTORY  Chief Complaint Fall  Level 5 caveat:  Portions of the history and physical were unable to be obtained due to dementia   HPI Beth Roberts is a 83 y.o. female with h/o Parkinson's, multiple falls, osteoporosis, HTN, HLD, COPD who presents for evaluation after a fall.Patient was in the day room and had a mechanical fall witnessed by staff. She hit her head on the floor but no LOC. Patient has been holding her left leg flexed against her body complaining of pain in her left hip since the fall. Patient is confused and oriented to self only which is her baseline.   Past Medical History:  Diagnosis Date  . Allergy    allergic rhinitis  . COPD (chronic obstructive pulmonary disease) (HCC)    congenital Lung Deformity//Scar tissue on lung( cxr) after surgery  )  . Dementia   . GERD (gastroesophageal reflux disease)    hiatal henia  . Hyperlipidemia   . Hypertension   . Osteoporosis   . Parkinson disease [facility])     Patient Active Problem List   Diagnosis Date Noted  . Closed left hip fracture (HCC) 05/18/2017  . Dementia due to Parkinson's disease without behavioral disturbance (HCC) 05/18/2017  . Protein-calorie malnutrition, severe (HCC) 07/19/2015  . Altered mental status 07/18/2015  . Encounter for Medicare annual wellness exam 03/25/2013  . Stress reaction 03/07/2013  . Restless legs syndrome (RLS) 11/25/2012  . Staring spell 11/25/2012  . Hoarseness, chronic 09/25/2012  . Other screening mammogram 09/25/2011  . Varicose veins 09/25/2011  . EPISTAXIS 01/20/2011  . HYPOGLYCEMIA, UNSPECIFIED 11/12/2008  . Seasonal and perennial allergic rhinitis 09/25/2007  . HLD (hyperlipidemia) 04/30/2007  . Essential hypertension 04/30/2007  . COPD 04/30/2007    . GERD 04/30/2007    Past Surgical History:  Procedure Laterality Date  . ABDOMINAL HYSTERECTOMY     partial ; bleeding  . APPENDECTOMY    . CT LUNG SCREENING  12/2004   right basilor ? mass// Lung CT stable 05/2005  . CYSTOSCOPY  1993   bladder diverticulum  . EYE SURGERY     cataract extraction, bilateral  . FOOT GANGLION EXCISION    . fracture left shoulder  2008  . FRACTURE SURGERY  2008   left shoulder   . LUNG REMOVAL, PARTIAL  1988   Lung -wedge resection/s/p sequestration (Has scar tissue on cxr now)  . SHOULDER SURGERY  2000-2001   Bilaterial    Prior to Admission medications   Medication Sig Start Date End Date Taking? Authorizing Provider  acetaminophen (TYLENOL) 325 MG tablet Take 2 tablets (650 mg total) by mouth every 6 (six) hours as needed for mild pain, fever or headache. 07/20/15   Suan Halter, MD  bisacodyl (DULCOLAX) 10 MG suppository Place 1 suppository (10 mg total) rectally daily as needed for moderate constipation. 07/20/15   Suan Halter, MD  carbidopa-levodopa (SINEMET IR) 10-100 MG per tablet Take 1 tablet by mouth 3 (three) times daily.    [provider]  Carboxymethylcell-Hypromellose (GENTEAL) 0.25-0.3 % GEL Apply 1 application to eye at bedtime.    [provider]  cephALEXin (KEFLEX) 500 MG capsule Take 1 capsule (500 mg total) by mouth 3 (three) times daily. 03/09/16   Minna Antis, MD  clonazePAM (KLONOPIN) 0.5 MG tablet One tab  po qhs prn severe insomnia 07/20/15   Suan Halter, MD  cloNIDine (CATAPRES) 0.1 MG tablet Take 0.1 mg by mouth daily.    [provider]  loratadine (CLARITIN) 10 MG tablet Take 1 tablet (10 mg total) by mouth daily as needed for allergies. 07/20/15   Suan Halter, MD  losartan (COZAAR) 50 MG tablet TAKE 1 TABLET BY MOUTH EVERY DAY Patient taking differently: Take 50 mg by mouth daily.  09/09/13   Tower, Audrie Gallus, MD  Morphine Sulfate (MORPHINE CONCENTRATE) 10  mg / 0.5 ml concentrated solution Take 0.13 mLs (2.6 mg total) by mouth every 4 (four) hours as needed for moderate pain. 07/20/15   Suan Halter, MD  Morphine Sulfate (MORPHINE CONCENTRATE) 10 mg / 0.5 ml concentrated solution Take 0.25 mLs (5 mg total) by mouth every 2 (two) hours as needed for severe pain or shortness of breath. 07/20/15   Suan Halter, MD  omeprazole (PRILOSEC) 20 MG capsule TAKE 1 CAPSULE (20 MG TOTAL) BY MOUTH AT BEDTIME. Patient taking differently: Take 20 mg by mouth at bedtime.  09/30/13   Tower, Audrie Gallus, MD  ondansetron (ZOFRAN) 4 MG tablet Take 4 mg by mouth every 6 (six) hours as needed for nausea or vomiting.    [provider]  Polyethyl Glycol-Propyl Glycol (SYSTANE) 0.4-0.3 % SOLN Apply 1 drop to eye 4 (four) times daily.    [provider]  pramipexole (MIRAPEX) 0.125 MG tablet 1 in AM, 2 po q hs 06/05/13   Tat, Rebecca S, DO  sennosides-docusate sodium (SENOKOT-S) 8.6-50 MG tablet Take 1 tablet by mouth 2 (two) times daily as needed for constipation. 07/20/15   Suan Halter, MD  sertraline (ZOLOFT) 50 MG tablet Take 50 mg po daily for 2 days then 25 mg po daily for 6 days then 25 mg po every other day for 6 doses then 25 mg every 4th day for 4 doses then stop. 07/20/15   Suan Halter, MD    Allergies Ace inhibitors; Amlodipine; Codeine; Klonopin [clonazepam]; Penicillins; Requip [ropinirole hcl]; and Sulfonamide derivatives  Family History  Problem Relation Age of Onset  . Cancer Mother        breast  . Hypertension Mother   . Stroke Mother   . Heart disease Father        MI  . Hyperlipidemia Brother   . Hypertension Brother     Social History Social History  Substance Use Topics  . Smoking status: Never Smoker  . Smokeless tobacco: Never Used  . Alcohol use No    Review of Systems Level 5 caveat:  Portions of the history and physical were unable to be obtained due to dementia   Musculoskeletal:  Negative for back injury, + Left hip pain Skin: + forehead abrasion Neurological: + head injury.   ____________________________________________   PHYSICAL EXAM:  VITAL SIGNS: ED Triage Vitals [05/18/17 1908]  Enc Vitals Group     BP 137/81     Pulse Rate 100     Resp 16     Temp 97.7 F (36.5 C)     Temp Source Oral     SpO2 95 %     Weight      Height      Head Circumference      Peak Flow      Pain Score      Pain Loc      Pain Edu?      Excl.  in GC?    Full spinal precautions maintained throughout the trauma exam. Constitutional: Alert and oriented x 1 (baseline). No acute distress. Does not appear intoxicated. HEENT Head: Normocephalic and abrasion./ skin tear on the left forehead. Face: No facial bony tenderness. Stable midface Ears: No hemotympanum bilaterally. No Battle sign Eyes: No eye injury. PERRL. No raccoon eyes Nose: Nontender. No epistaxis. No rhinorrhea Mouth/Throat: Mucous membranes are moist. No oropharyngeal blood. No dental injury. Airway patent without stridor. Normal voice. Neck: no C-collar in place. No midline c-spine tenderness.  Cardiovascular: Normal rate, regular rhythm. Normal and symmetric distal pulses are present in all extremities. Pulmonary/Chest: Chest wall is stable and nontender to palpation/compression. Normal respiratory effort. Breath sounds are normal. No crepitus.  Abdominal: Soft, nontender, non distended. Musculoskeletal: Patient holding both legs flexed against her abdomen will not straighten them, there is bruising over the left hip. No deformities. No thoracic or lumbar midline spinal tenderness. Pelvis is stable. Skin: Skin is warm, dry and intact. No abrasions or contutions. Psychiatric: Speech and behavior are appropriate. Neurological: Normal speech and language. Moves all extremities to command. No gross focal neurologic deficits are appreciated.  Glascow Coma Score: 4 - Opens eyes on own 6 - Follows simple motor  commands 4 - Seems confused, disoriented GCS: 14   ____________________________________________   LABS (all labs ordered are listed, but only abnormal results are displayed)  Labs Reviewed  CBC - Abnormal; Notable for the following:       Result Value   WBC 19.0 (*)    RBC 2.96 (*)    Hemoglobin 10.5 (*)    HCT 29.6 (*)    MCH 35.3 (*)    All other components within normal limits  BASIC METABOLIC PANEL  PROTIME-INR  APTT  TYPE AND SCREEN   ____________________________________________  EKG  ED ECG REPORT I, Nita Sicklearolina Moranda Billiot, the attending physician, personally viewed and interpreted this ECG.  EKG interpretation is limited due to significant interference from patient's tremors. Sinus tachycardia, rate of 102, normal intervals, normal axis, no ST elevations or depressions. ____________________________________________  RADIOLOGY  CT head/ cspine:1. No acute intracranial abnormality. No skull fracture. 2. Stable degenerative change in the cervical spine without acute fracture.  XR pelvis and L hip:  1. Comminuted intertrochanteric fracture of the left hip with severe varus angulation. 2. Mild osteopenia. 3. Right hip hemiprosthesis ____________________________________________   PROCEDURES  Procedure(s) performed: None Procedures Critical Care performed:  None ____________________________________________   INITIAL IMPRESSION / ASSESSMENT AND PLAN / ED COURSE   83 y.o. female with h/o Parkinson's, multiple falls, osteoporosis, HTN, HLD, COPD who presents for evaluation after a fall. Patient with a hematoma and skin tear in the left forehand, we'll image head and neck to rule out acute findings. No signs or symptoms of basilar skull fracture and exam. Will get XR of the pelvis and L hip. Tetanus up to date.  Clinical Course as of May 19 2123  Fri May 18, 2017  2011 Imaging studies concerning for left hip fracture. Discussed with Dr. Rosita KeaMenz who will plan to  repair tomorrow morning. Daughter and son in law updated in the room. Will get basic surgical labs, EKG, and CXR. Patient will be admitted to the Hospitalist service.  [CV]    Clinical Course User Index [CV] Nita SickleVeronese, , MD    Pertinent labs & imaging results that were available during my care of the patient were reviewed by me and considered in my medical decision making (see chart for  details).    ____________________________________________   FINAL CLINICAL IMPRESSION(S) / ED DIAGNOSES  Final diagnoses:  Fall, initial encounter  Closed fracture of left hip, initial encounter (HCC)  Traumatic hematoma of forehead, initial encounter      NEW MEDICATIONS STARTED DURING THIS VISIT:  New Prescriptions   No medications on file     Note:  This document was prepared using Dragon voice recognition software and may include unintentional dictation errors.    Nita Sickle, MD 05/18/17 2022    Nita Sickle, MD 05/18/17 2124

## 2017-05-18 NOTE — ED Notes (Signed)
Hospitalist at bedside 

## 2017-05-18 NOTE — ED Notes (Signed)
ED Provider at bedside. 

## 2017-05-18 NOTE — H&P (Signed)
[facility] [place] at [facility]   PATIENT NAME: Beth Roberts    MR#:  [id number]  DATE OF BIRTH:  [birth year]  DATE OF ADMISSION:  05/18/2017  PRIMARY CARE PHYSICIAN: Dorothey Baseman, MD   REQUESTING/REFERRING PHYSICIAN: Don Perking, MD  CHIEF COMPLAINT:   Chief Complaint  Patient presents with  . Fall    HISTORY OF PRESENT ILLNESS:  Beth Roberts  is a 83 y.o. female who presents with Fall and subsequent left hip fracture. Patient is demented and unable to contribute to her history. History is given by family members. Patient is a resident at a nursing facility, and is generally restricted to her wheelchair at baseline, this is due to her Parkinson's disease. Today at dinnertime she got up from her wheelchair on her own and fell, and broke her left hip. This is confirmed by imaging here in the ED. Orthopedic surgery was contacted, and hospitalists were called for admission.  PAST MEDICAL HISTORY:   Past Medical History:  Diagnosis Date  . Allergy    allergic rhinitis  . COPD (chronic obstructive pulmonary disease) (HCC)    congenital Lung Deformity//Scar tissue on lung( cxr) after surgery  )  . Dementia   . GERD (gastroesophageal reflux disease)    hiatal henia  . Hyperlipidemia   . Hypertension   . Osteoporosis   . Parkinson disease (HCC)     PAST SURGICAL HISTORY:   Past Surgical History:  Procedure Laterality Date  . ABDOMINAL HYSTERECTOMY     partial ; bleeding  . APPENDECTOMY    . CT LUNG SCREENING  12/2004   right basilor ? mass// Lung CT stable 05/2005  . CYSTOSCOPY  1993   bladder diverticulum  . EYE SURGERY     cataract extraction, bilateral  . FOOT GANGLION EXCISION    . fracture left shoulder  2008  . FRACTURE SURGERY  2008   left shoulder   . LUNG REMOVAL, PARTIAL  1988   Lung -wedge resection/s/p sequestration (Has scar tissue on cxr now)  . SHOULDER SURGERY  2000-2001   Bilaterial    SOCIAL HISTORY:   Social History   Substance Use Topics  . Smoking status: Never Smoker  . Smokeless tobacco: Never Used  . Alcohol use No    FAMILY HISTORY:   Family History  Problem Relation Age of Onset  . Cancer Mother        breast  . Hypertension Mother   . Stroke Mother   . Heart disease Father        MI  . Hyperlipidemia Brother   . Hypertension Brother     DRUG ALLERGIES:   Allergies  Allergen Reactions  . Ace Inhibitors     Voice dysfunction  . Amlodipine     Flushing/ dizziness  . Codeine     REACTION: nausea and vomiting  . Klonopin [Clonazepam]     Dizzy/ sleepless  . Penicillins     REACTION: breaks out  . Requip [Ropinirole Hcl]     Not effective and makes her nervous  . Sulfonamide Derivatives     MEDICATIONS AT HOME:   Prior to Admission medications   Medication Sig Start Date End Date Taking? Authorizing Provider  acetaminophen (TYLENOL) 325 MG tablet Take 2 tablets (650 mg total) by mouth every 6 (six) hours as needed for mild pain, fever or headache. 07/20/15   Suan Halter, MD  bisacodyl (DULCOLAX) 10 MG suppository Place 1 suppository (10 mg total) rectally  daily as needed for moderate constipation. 07/20/15   Suan Halter, MD  carbidopa-levodopa (SINEMET IR) 10-100 MG per tablet Take 1 tablet by mouth 3 (three) times daily.    [provider]  Carboxymethylcell-Hypromellose (GENTEAL) 0.25-0.3 % GEL Apply 1 application to eye at bedtime.    [provider]  cephALEXin (KEFLEX) 500 MG capsule Take 1 capsule (500 mg total) by mouth 3 (three) times daily. 03/09/16   Minna Antis, MD  clonazePAM (KLONOPIN) 0.5 MG tablet One tab po qhs prn severe insomnia 07/20/15   Suan Halter, MD  cloNIDine (CATAPRES) 0.1 MG tablet Take 0.1 mg by mouth daily.    [provider]  loratadine (CLARITIN) 10 MG tablet Take 1 tablet (10 mg total) by mouth daily as needed for allergies. 07/20/15   Suan Halter, MD  losartan (COZAAR) 50 MG  tablet TAKE 1 TABLET BY MOUTH EVERY DAY Patient taking differently: Take 50 mg by mouth daily.  09/09/13   Tower, Audrie Gallus, MD  Morphine Sulfate (MORPHINE CONCENTRATE) 10 mg / 0.5 ml concentrated solution Take 0.13 mLs (2.6 mg total) by mouth every 4 (four) hours as needed for moderate pain. 07/20/15   Suan Halter, MD  Morphine Sulfate (MORPHINE CONCENTRATE) 10 mg / 0.5 ml concentrated solution Take 0.25 mLs (5 mg total) by mouth every 2 (two) hours as needed for severe pain or shortness of breath. 07/20/15   Suan Halter, MD  omeprazole (PRILOSEC) 20 MG capsule TAKE 1 CAPSULE (20 MG TOTAL) BY MOUTH AT BEDTIME. Patient taking differently: Take 20 mg by mouth at bedtime.  09/30/13   Tower, Audrie Gallus, MD  ondansetron (ZOFRAN) 4 MG tablet Take 4 mg by mouth every 6 (six) hours as needed for nausea or vomiting.    [provider]  Polyethyl Glycol-Propyl Glycol (SYSTANE) 0.4-0.3 % SOLN Apply 1 drop to eye 4 (four) times daily.    [provider]  pramipexole (MIRAPEX) 0.125 MG tablet 1 in AM, 2 po q hs 06/05/13   Tat, Rebecca S, DO  sennosides-docusate sodium (SENOKOT-S) 8.6-50 MG tablet Take 1 tablet by mouth 2 (two) times daily as needed for constipation. 07/20/15   Suan Halter, MD  sertraline (ZOLOFT) 50 MG tablet Take 50 mg po daily for 2 days then 25 mg po daily for 6 days then 25 mg po every other day for 6 doses then 25 mg every 4th day for 4 doses then stop. 07/20/15   Suan Halter, MD    REVIEW OF SYSTEMS:  Review of Systems  Unable to perform ROS: Dementia     VITAL SIGNS:   Vitals:   05/18/17 1908  BP: 137/81  Pulse: 100  Resp: 16  Temp: 97.7 F (36.5 C)  TempSrc: Oral  SpO2: 95%   Wt Readings from Last 3 Encounters:  10/04/16 36.9 kg (81 lb 4.8 oz)  03/09/16 36.3 kg (80 lb)  07/18/15 34.5 kg (76 lb 1.6 oz)    PHYSICAL EXAMINATION:  Physical Exam  Vitals reviewed. Constitutional: She appears well-developed and well-nourished. No  distress.  HENT:  Head: Normocephalic and atraumatic.  Mouth/Throat: Oropharynx is clear and moist.  Eyes: Pupils are equal, round, and reactive to light. Conjunctivae and EOM are normal. No scleral icterus.  Neck: Normal range of motion. Neck supple. No JVD present. No thyromegaly present.  Cardiovascular: Normal rate, regular rhythm and intact distal pulses.  Exam reveals no gallop and no friction rub.   No murmur  heard. Respiratory: Effort normal and breath sounds normal. No respiratory distress. She has no wheezes. She has no rales.  GI: Soft. Bowel sounds are normal. She exhibits no distension. There is no tenderness.  Musculoskeletal: Normal range of motion. She exhibits tenderness (Left hip). She exhibits no edema.  No arthritis, no gout  Lymphadenopathy:    She has no cervical adenopathy.  Neurological: She is alert.  Unable to fully assess due to patient condition  Skin: Skin is warm and dry. No rash noted. No erythema.  Psychiatric:  Unable to assess due to patient condition    LABORATORY PANEL:   CBC No results for input(s): WBC, HGB, HCT, PLT in the last 168 hours. ------------------------------------------------------------------------------------------------------------------  Chemistries  No results for input(s): NA, K, CL, CO2, GLUCOSE, BUN, CREATININE, CALCIUM, MG, AST, ALT, ALKPHOS, BILITOT in the last 168 hours.  Invalid input(s): GFRCGP ------------------------------------------------------------------------------------------------------------------  Cardiac Enzymes No results for input(s): TROPONINI in the last 168 hours. ------------------------------------------------------------------------------------------------------------------  RADIOLOGY:  Ct Head Wo Contrast  Result Date: 05/18/2017 CLINICAL DATA:  Witnessed fall today. No loss of consciousness. Left forehead hematoma. EXAM: CT HEAD WITHOUT CONTRAST CT CERVICAL SPINE WITHOUT CONTRAST TECHNIQUE:  Multidetector CT imaging of the head and cervical spine was performed following the standard protocol without intravenous contrast. Multiplanar CT image reconstructions of the cervical spine were also generated. COMPARISON:  Most recent CT 10/04/2016 FINDINGS: CT HEAD FINDINGS Brain: Stable degree of atrophy and chronic small vessel ischemia. No intracranial hemorrhage, mass effect, or midline shift. No hydrocephalus. The basilar cisterns are patent. No evidence of territorial infarct. No extra-axial or intracranial fluid collection. Vascular: Atherosclerosis of skullbase vasculature without hyperdense vessel or abnormal calcification. Skull: No fracture or focal lesion. Sinuses/Orbits: Paranasal sinuses and mastoid air cells are clear. The visualized orbits are unremarkable. Bilateral cataract resection. Other: None. CT CERVICAL SPINE FINDINGS Alignment: Stable from prior exam. No traumatic subluxation. Minimal anterolisthesis of C3 on C4 and C7 on T1 is unchanged and likely degenerative. Skull base and vertebrae: No acute fracture. Chronic loss of height of left aspect of C1 lateral mass. No primary bone lesion or focal pathologic process. Soft tissues and spinal canal: No prevertebral fluid or swelling. No visible canal hematoma. Disc levels: Diffuse disc space narrowing and endplate spurring. Multilevel facet arthropathy. Degenerative changes are similar to prior exam. Upper chest: No acute abnormality. Other: None. IMPRESSION: 1.  No acute intracranial abnormality.  No skull fracture. 2. Stable degenerative change in the cervical spine without acute fracture. Electronically Signed   By: Rubye Oaks M.D.   On: 05/18/2017 20:00   Ct Cervical Spine Wo Contrast  Result Date: 05/18/2017 CLINICAL DATA:  Witnessed fall today. No loss of consciousness. Left forehead hematoma. EXAM: CT HEAD WITHOUT CONTRAST CT CERVICAL SPINE WITHOUT CONTRAST TECHNIQUE: Multidetector CT imaging of the head and cervical spine was  performed following the standard protocol without intravenous contrast. Multiplanar CT image reconstructions of the cervical spine were also generated. COMPARISON:  Most recent CT 10/04/2016 FINDINGS: CT HEAD FINDINGS Brain: Stable degree of atrophy and chronic small vessel ischemia. No intracranial hemorrhage, mass effect, or midline shift. No hydrocephalus. The basilar cisterns are patent. No evidence of territorial infarct. No extra-axial or intracranial fluid collection. Vascular: Atherosclerosis of skullbase vasculature without hyperdense vessel or abnormal calcification. Skull: No fracture or focal lesion. Sinuses/Orbits: Paranasal sinuses and mastoid air cells are clear. The visualized orbits are unremarkable. Bilateral cataract resection. Other: None. CT CERVICAL SPINE FINDINGS Alignment: Stable from prior exam. No traumatic  subluxation. Minimal anterolisthesis of C3 on C4 and C7 on T1 is unchanged and likely degenerative. Skull base and vertebrae: No acute fracture. Chronic loss of height of left aspect of C1 lateral mass. No primary bone lesion or focal pathologic process. Soft tissues and spinal canal: No prevertebral fluid or swelling. No visible canal hematoma. Disc levels: Diffuse disc space narrowing and endplate spurring. Multilevel facet arthropathy. Degenerative changes are similar to prior exam. Upper chest: No acute abnormality. Other: None. IMPRESSION: 1.  No acute intracranial abnormality.  No skull fracture. 2. Stable degenerative change in the cervical spine without acute fracture. Electronically Signed   By: Rubye OaksMelanie  Ehinger M.D.   On: 05/18/2017 20:00   Dg Chest Portable 1 View  Result Date: 05/18/2017 CLINICAL DATA:  Preop, hip fracture history of hypertension EXAM: PORTABLE CHEST 1 VIEW COMPARISON:  07/18/2015 FINDINGS: Hyperinflation with pleural and parenchymal scarring at the right base. No acute consolidation or effusion. Stable cardiomediastinal silhouette with postsurgical  changes on the right. Aortic atherosclerosis. No pneumothorax. Postsurgical changes in the right humeral head. Old right fifth rib fracture. IMPRESSION: Stable scarring in the right lung base. No radiographic evidence for acute cardiopulmonary abnormality. Electronically Signed   By: Jasmine PangKim  Fujinaga M.D.   On: 05/18/2017 20:27   Dg Hip Unilat W Or Wo Pelvis 2-3 Views Left  Result Date: 05/18/2017 CLINICAL DATA:  Fall.  Left hip pain. EXAM: DG HIP (WITH OR WITHOUT PELVIS) 2-3V LEFT COMPARISON:  Left hip radiographs 02/22/2015 FINDINGS: The comminuted intratrochanteric fracture is present in the left hip. There is significant varus angulation. There is foreshortening of the hip as well. The femoral head in remains located. The pelvis is intact. A right hip hemiprosthesis is noted. Degenerative changes are again seen in the lower lumbar spine. Mild osteopenia is noted. IMPRESSION: 1. Comminuted intertrochanteric fracture of the left hip with severe varus angulation. 2. Mild osteopenia. 3. Right hip hemiprosthesis. Electronically Signed   By: Marin Robertshristopher  Mattern M.D.   On: 05/18/2017 20:06    EKG:   Orders placed or performed during the hospital encounter of 05/18/17  . ED EKG  . ED EKG    IMPRESSION AND PLAN:  Principal Problem:   Closed left hip fracture (HCC) - orthopedic surgery consult, when necessary analgesia, cardiac risk stratification as below - patient has no known risk factors. Of note, she does have a listed stroke on her prior history on chart review, but review of the MRI scans from that time showed no infarct.   Active Problems:   Dementia due to Parkinson's disease without behavioral disturbance (HCC) - continue home dose Sinemet   Essential hypertension - continue home meds   HLD (hyperlipidemia) - home dose antilipid   GERD - home dose PPI  All the records are reviewed and case discussed with ED provider. Management plans discussed with the patient and/or family.  DVT  PROPHYLAXIS: Mechanical only  GI PROPHYLAXIS: PPI  ADMISSION STATUS: Inpatient  CODE STATUS: DNR Code Status History    Date Active Date Inactive Code Status Order ID Comments User Context   07/19/2015  3:43 PM 07/20/2015 10:21 PM DNR [id number]  Suan Halterampbell, Margaret F, MD Inpatient   07/18/2015 11:16 PM 07/19/2015  3:43 PM DNR [id number]  Shaune Pollackhen, Qing, MD Inpatient    Questions for Most Recent Historical Code Status (Order [id number])    Question Answer Comment   In the event of cardiac or respiratory ARREST Do not call a "code blue"    In the  event of cardiac or respiratory ARREST Do not perform Intubation, CPR, defibrillation or ACLS    In the event of cardiac or respiratory ARREST Use medication by any route, position, wound care, and other measures to relive pain and suffering. May use oxygen, suction and manual treatment of airway obstruction as needed for comfort.    Comments Pt also has a MOST form --indicating no feeding tube is desired and other directives.       TOTAL TIME TAKING CARE OF THIS PATIENT: 45 minutes.   Errika Narvaiz FIELDING 05/18/2017, 8:54 PM  [facility]  Office  [phone]  CC: Primary care physician; Dorothey Baseman, MD  Note:  This document was prepared using Dragon voice recognition software and may include unintentional dictation errors.

## 2017-05-18 NOTE — ED Triage Notes (Signed)
Patient arrived via EMS with witnessed fall today with no LOC.  Patient is guarding her left hip although she is not expressing any pain.  EMS stated they did not see any external rotation during assessment.  She has a left forehead hematoma with bleeding controlled and she says that hurts.  Patient is demented and at baseline per facility to EMS.

## 2017-05-18 NOTE — ED Notes (Signed)
Pt transport to 145 

## 2017-05-19 ENCOUNTER — Encounter
Admission: EM | Disposition: A | Discharge status: Skilled Nursing Facility | Payer: Self-pay | Source: Home / Self Care | Attending: Internal Medicine

## 2017-05-19 ENCOUNTER — Inpatient Hospital Stay

## 2017-05-19 ENCOUNTER — Inpatient Hospital Stay: Admitting: Anesthesiology

## 2017-05-19 DIAGNOSIS — L8990 Pressure ulcer of unspecified site, unspecified stage: Secondary | ICD-10-CM | POA: Insufficient documentation

## 2017-05-19 HISTORY — PX: INTRAMEDULLARY (IM) NAIL INTERTROCHANTERIC: SHX5875

## 2017-05-19 LAB — CBC
HEMATOCRIT: 27.2 % — AB (ref 35.0–47.0)
HEMOGLOBIN: 9.2 g/dL — AB (ref 12.0–16.0)
MCH: 33.8 pg (ref 26.0–34.0)
MCHC: 33.7 g/dL (ref 32.0–36.0)
MCV: 100.2 fL — AB (ref 80.0–100.0)
Platelets: 365 10*3/uL (ref 150–440)
RBC: 2.71 MIL/uL — ABNORMAL LOW (ref 3.80–5.20)
RDW: 12.8 % (ref 11.5–14.5)
WBC: 14.1 10*3/uL — ABNORMAL HIGH (ref 3.6–11.0)

## 2017-05-19 LAB — BASIC METABOLIC PANEL
ANION GAP: 8 (ref 5–15)
BUN: 35 mg/dL — AB (ref 6–20)
CO2: 25 mmol/L (ref 22–32)
Calcium: 8.8 mg/dL — ABNORMAL LOW (ref 8.9–10.3)
Chloride: 108 mmol/L (ref 101–111)
Creatinine, Ser: 0.91 mg/dL (ref 0.44–1.00)
GFR calc Af Amer: >60 mL/min (ref >60)
GFR calc non Af Amer: 54 mL/min — ABNORMAL LOW (ref >60)
GLUCOSE: 145 mg/dL — AB (ref 65–99)
POTASSIUM: 4.7 mmol/L (ref 3.5–5.1)
Sodium: 141 mmol/L (ref 135–145)

## 2017-05-19 LAB — URINALYSIS, COMPLETE (UACMP) WITH MICROSCOPIC
Bilirubin Urine: NEGATIVE
GLUCOSE, UA: NEGATIVE mg/dL
KETONES UR: NEGATIVE mg/dL
Leukocytes, UA: NEGATIVE
Nitrite: NEGATIVE
PROTEIN: 100 mg/dL — AB
Specific Gravity, Urine: 1.02 (ref 1.005–1.030)
pH: 5 (ref 5.0–8.0)

## 2017-05-19 SURGERY — FIXATION, FRACTURE, INTERTROCHANTERIC, WITH INTRAMEDULLARY ROD
Anesthesia: General | Laterality: Left

## 2017-05-19 MED ORDER — DOCUSATE SODIUM 100 MG PO CAPS
100.0000 mg | ORAL_CAPSULE | Freq: Two times a day (BID) | ORAL | Status: DC
  Administered 2017-05-19 – 2017-05-22 (×6): 100 mg via ORAL
  Filled 2017-05-19 (×6): qty 1

## 2017-05-19 MED ORDER — CLINDAMYCIN PHOSPHATE 600 MG/50ML IV SOLN
600.0000 mg | Freq: Four times a day (QID) | INTRAVENOUS | Status: AC
  Administered 2017-05-19 – 2017-05-20 (×3): 600 mg via INTRAVENOUS
  Filled 2017-05-19 (×3): qty 50

## 2017-05-19 MED ORDER — BISACODYL 10 MG RE SUPP: 10.0000 mg | Freq: Every day | RECTAL | Status: DC | PRN

## 2017-05-19 MED ORDER — ENOXAPARIN SODIUM 30 MG/0.3ML ~~LOC~~ SOLN
30.0000 mg | SUBCUTANEOUS | Status: DC
  Administered 2017-05-20 – 2017-05-22 (×3): 30 mg via SUBCUTANEOUS
  Filled 2017-05-19 (×3): qty 0.3

## 2017-05-19 MED ORDER — ONDANSETRON HCL 4 MG/2ML IJ SOLN
INTRAMUSCULAR | Status: DC | PRN
  Administered 2017-05-19: 4 mg via INTRAVENOUS

## 2017-05-19 MED ORDER — MENTHOL 3 MG MT LOZG
1.0000 | LOZENGE | OROMUCOSAL | Status: DC | PRN
  Filled 2017-05-19: qty 9

## 2017-05-19 MED ORDER — HYDROCODONE-ACETAMINOPHEN 5-325 MG PO TABS: 1.0000 | ORAL_TABLET | Freq: Four times a day (QID) | ORAL | Status: DC | PRN

## 2017-05-19 MED ORDER — NEOMYCIN-POLYMYXIN B GU 40-200000 IR SOLN
Status: DC | PRN
  Administered 2017-05-19: 4 mL

## 2017-05-19 MED ORDER — PROPOFOL 10 MG/ML IV BOLUS
INTRAVENOUS | Status: DC | PRN
  Administered 2017-05-19: 30 mg via INTRAVENOUS

## 2017-05-19 MED ORDER — METHOCARBAMOL 1000 MG/10ML IJ SOLN
500.0000 mg | Freq: Four times a day (QID) | INTRAVENOUS | Status: DC | PRN
  Filled 2017-05-19: qty 5

## 2017-05-19 MED ORDER — SODIUM CHLORIDE 0.9 % IV SOLN
INTRAVENOUS | Status: DC | PRN
  Administered 2017-05-19 (×2): via INTRAVENOUS

## 2017-05-19 MED ORDER — PROPOFOL 500 MG/50ML IV EMUL
INTRAVENOUS | Status: DC | PRN
  Administered 2017-05-19: 50 ug/kg/min via INTRAVENOUS

## 2017-05-19 MED ORDER — FENTANYL CITRATE (PF) 250 MCG/5ML IJ SOLN
INTRAMUSCULAR | Status: AC
  Filled 2017-05-19: qty 5

## 2017-05-19 MED ORDER — ONDANSETRON HCL 4 MG/2ML IJ SOLN
INTRAMUSCULAR | Status: AC
  Filled 2017-05-19: qty 2

## 2017-05-19 MED ORDER — PHENOL 1.4 % MT LIQD
1.0000 | OROMUCOSAL | Status: DC | PRN
  Filled 2017-05-19: qty 177

## 2017-05-19 MED ORDER — MAGNESIUM CITRATE PO SOLN
1.0000 | Freq: Once | ORAL | Status: DC | PRN
  Filled 2017-05-19: qty 296

## 2017-05-19 MED ORDER — FENTANYL CITRATE (PF) 100 MCG/2ML IJ SOLN
INTRAMUSCULAR | Status: DC | PRN
  Administered 2017-05-19 (×6): 25 ug via INTRAVENOUS

## 2017-05-19 MED ORDER — ALUM & MAG HYDROXIDE-SIMETH 200-200-20 MG/5ML PO SUSP: 30.0000 mL | ORAL | Status: DC | PRN

## 2017-05-19 MED ORDER — PROPOFOL 10 MG/ML IV BOLUS
INTRAVENOUS | Status: AC
  Filled 2017-05-19: qty 20

## 2017-05-19 MED ORDER — PROPOFOL 500 MG/50ML IV EMUL
INTRAVENOUS | Status: AC
  Filled 2017-05-19: qty 50

## 2017-05-19 MED ORDER — KETAMINE HCL 10 MG/ML IJ SOLN
INTRAMUSCULAR | Status: DC | PRN
  Administered 2017-05-19 (×2): 30 mg via INTRAVENOUS
  Administered 2017-05-19: 40 mg via INTRAVENOUS

## 2017-05-19 MED ORDER — ONDANSETRON HCL 4 MG/2ML IJ SOLN: 4.0000 mg | Freq: Once | INTRAMUSCULAR | Status: DC | PRN

## 2017-05-19 MED ORDER — FENTANYL CITRATE (PF) 100 MCG/2ML IJ SOLN: 25.0000 ug | INTRAMUSCULAR | Status: DC | PRN

## 2017-05-19 MED ORDER — METOCLOPRAMIDE HCL 5 MG/ML IJ SOLN: 5.0000 mg | Freq: Three times a day (TID) | INTRAMUSCULAR | Status: DC | PRN

## 2017-05-19 MED ORDER — METOCLOPRAMIDE HCL 10 MG PO TABS: 5.0000 mg | ORAL_TABLET | Freq: Three times a day (TID) | ORAL | Status: DC | PRN

## 2017-05-19 MED ORDER — KETAMINE HCL 50 MG/ML IJ SOLN
INTRAMUSCULAR | Status: AC
  Filled 2017-05-19: qty 10

## 2017-05-19 MED ORDER — SODIUM CHLORIDE 0.9 % IV SOLN
INTRAVENOUS | Status: DC
  Administered 2017-05-19 – 2017-05-20 (×3): via INTRAVENOUS

## 2017-05-19 MED ORDER — METHOCARBAMOL 500 MG PO TABS: 500.0000 mg | ORAL_TABLET | Freq: Four times a day (QID) | ORAL | Status: DC | PRN

## 2017-05-19 MED ORDER — MAGNESIUM HYDROXIDE 400 MG/5ML PO SUSP: 30.0000 mL | Freq: Every day | ORAL | Status: DC | PRN

## 2017-05-19 SURGICAL SUPPLY — 32 items
BIT DRILL 4.3MMS DISTAL GRDTED (BIT) IMPLANT
CANISTER SUCT 1200ML W/VALVE (MISCELLANEOUS) ×3 IMPLANT
CHLORAPREP W/TINT 26ML (MISCELLANEOUS) ×3 IMPLANT
DRAPE SHEET LG 3/4 BI-LAMINATE (DRAPES) ×3 IMPLANT
DRAPE U-SHAPE 47X51 STRL (DRAPES) ×3 IMPLANT
DRILL 4.3MMS DISTAL GRADUATED (BIT) ×3
DRSG OPSITE POSTOP 3X4 (GAUZE/BANDAGES/DRESSINGS) ×9 IMPLANT
GLOVE BIOGEL PI IND STRL 9 (GLOVE) ×1 IMPLANT
GLOVE BIOGEL PI INDICATOR 9 (GLOVE) ×2
GLOVE SURG SYN 9.0  PF PI (GLOVE) ×2
GLOVE SURG SYN 9.0 PF PI (GLOVE) ×1 IMPLANT
GOWN SRG 2XL LVL 4 RGLN SLV (GOWNS) ×1 IMPLANT
GOWN STRL NON-REIN 2XL LVL4 (GOWNS) ×3
GOWN STRL REUS W/ TWL LRG LVL3 (GOWN DISPOSABLE) ×1 IMPLANT
GOWN STRL REUS W/TWL LRG LVL3 (GOWN DISPOSABLE) ×3
GUIDEPIN VERSANAIL DSP 3.2X444 (ORTHOPEDIC DISPOSABLE SUPPLIES) ×2 IMPLANT
GUIDEWIRE BALL NOSE 80CM (WIRE) ×2 IMPLANT
HFN LH 130 DEG 9MM X 320MM (Nail) ×2 IMPLANT
IV NS 500ML (IV SOLUTION) ×3
IV NS 500ML BAXH (IV SOLUTION) ×1 IMPLANT
KIT RM TURNOVER STRD PROC AR (KITS) ×3 IMPLANT
MAT BLUE FLOOR 46X72 FLO (MISCELLANEOUS) ×3 IMPLANT
NDL FILTER BLUNT 18X1 1/2 (NEEDLE) ×1 IMPLANT
NEEDLE FILTER BLUNT 18X 1/2SAF (NEEDLE) ×2
NEEDLE FILTER BLUNT 18X1 1/2 (NEEDLE) ×1 IMPLANT
PACK HIP COMPR (MISCELLANEOUS) ×3 IMPLANT
SCREW LAG 6.5X80X10.5XLRG ST (Screw) IMPLANT
SCREW LAG 80MM (Screw) ×3 IMPLANT
STAPLER SKIN PROX 35W (STAPLE) ×3 IMPLANT
SUT VIC AB 1 CT1 36 (SUTURE) ×3 IMPLANT
SUT VIC AB 2-0 CT1 (SUTURE) ×3 IMPLANT
SYRINGE 10CC LL (SYRINGE) ×3 IMPLANT

## 2017-05-19 NOTE — Clinical Social Work Note (Signed)
CSW received verbal consult that patient is from [facility]. CSW will assess when able.  Argentina PonderKaren Martha Dalyce Renne, MSW, Theresia MajorsLCSWA [phone]

## 2017-05-19 NOTE — Plan of Care (Signed)
Problem: Education: Goal: Knowledge of [facility] Education information/materials will improve Outcome: Not Progressing Patient confused   Problem: Safety: Goal: Ability to remain free from injury will improve Outcome: Not Progressing Patient confused, however not trying to get out of bed. Mittens in place due to patient pulling at lines and foley.

## 2017-05-19 NOTE — Anesthesia Postprocedure Evaluation (Signed)
Anesthesia Post Note  Patient: Beth Roberts  Procedure(s) Performed: Procedure(s) (LRB): INTRAMEDULLARY (IM) NAIL INTERTROCHANTRIC (Left)  Patient location during evaluation: PACU Anesthesia Type: General Level of consciousness: confused Pain management: pain level controlled Vital Signs Assessment: post-procedure vital signs reviewed and stable Respiratory status: spontaneous breathing and respiratory function stable Cardiovascular status: stable Anesthetic complications: no     Last Vitals:  Vitals:   05/19/17 1254 05/19/17 1313  BP: (!) 149/74 (!) 137/51  Pulse: (!) 103 (!) 105  Resp: 11   Temp:  37.8 C    Last Pain:  Vitals:   05/19/17 1313  TempSrc: Oral  PainSc:                  KEPHART,WILLIAM K

## 2017-05-19 NOTE — Progress Notes (Signed)
Attempted IS with patient but she is unable to understand and follow commands to do. Marchelle FolksAmanda RN notified.

## 2017-05-19 NOTE — Anesthesia Preprocedure Evaluation (Signed)
Anesthesia Evaluation  Patient identified by MRN, date of birth, ID band Patient confused    Reviewed: Unable to perform ROS - Chart review only  History of Anesthesia Complications (+) PONV  Airway       Comment: Pt not cooperative with airway exam Dental   Pulmonary COPD,           Cardiovascular hypertension, Pt. on medications      Neuro/Psych PSYCHIATRIC DISORDERS (dementia)    GI/Hepatic GERD  Medicated,  Endo/Other    Renal/GU      Musculoskeletal   Abdominal   Peds  Hematology   Anesthesia Other Findings   Reproductive/Obstetrics                             Anesthesia Physical Anesthesia Plan  ASA: III and emergent  Anesthesia Plan: MAC   Post-op Pain Management:    Induction:   PONV Risk Score and Plan:   Airway Management Planned: Nasal Cannula and Simple Face Mask  Additional Equipment:   Intra-op Plan:   Post-operative Plan:   Informed Consent: I have reviewed the patients History and Physical, chart, labs and discussed the procedure including the risks, benefits and alternatives for the proposed anesthesia with the patient or authorized representative who has indicated his/her understanding and acceptance.     Plan Discussed with:   Anesthesia Plan Comments:         Anesthesia Quick Evaluation

## 2017-05-19 NOTE — Progress Notes (Signed)
Pt very confused and fidgety at times when awake. No complaints of pain during the night. Pt able to sleep in between care. Iv infusing without difficulty and Foley patent and draining urine. Surgery scheduled for 9am this morning.

## 2017-05-19 NOTE — Op Note (Signed)
05/18/2017 - 05/19/2017  12:04 PM  PATIENT:  Beth Roberts  83 y.o. female  PRE-OPERATIVE DIAGNOSIS:  left hip fracture subtrochanteric intertrochanteric  POST-OPERATIVE DIAGNOSIS:  left hip fracture same  PROCEDURE:  Procedure(s): INTRAMEDULLARY (IM) NAIL INTERTROCHANTRIC (Left)  SURGEON: Laurene Footman, MD  ASSISTANTS: None  ANESTHESIA:   MAC  EBL:  Total I/O In: 1000 [I.V.:1000] Out: -   BLOOD ADMINISTERED:none  DRAINS: none   LOCAL MEDICATIONS USED:  NONE  SPECIMEN:  No Specimen  DISPOSITION OF SPECIMEN:  N/A  COUNTS:  YES  TOURNIQUET:  * No tourniquets in log *  IMPLANTS: Affixes left 9 x 3 20 rod with 80 mm lag screw  DICTATION: .Dragon Dictation patient brought the operating room and after adequate anesthesia was obtained the leg was placed in the traction boot with the right foot in the well-leg holder. With traction applied poor reduction was obtained and the back was prepped and draped in sterile fashion. After patient education timeout procedures were completed and a Barrier drape method being used proximal incision was made for open reduction with a clamp and the reduction tools to get more acceptable alignment. Additionally a K wire was inserted into the proximal fragment and brought down to try to get reduce some of the varus of the fracture this is acceptable and proximal guidewire was inserted through the tip of the trochanter. Proximal reaming was carried out followed by placement of the 9 x 3 20 rod after measuring off the guidewire. The fracture was then still significant varus but guidewire could be placed in a center center position. Drilling was carried out followed by tapping and placing the 80 mm lag screw and then tightening and proximally as fixed angle device as it was not going to be able to slide. Next the proximal handle was removed and the fracture appeared adequately reduced and has not felt a distal screws required. The wounds were thoroughly  irrigated and then closed with simple interrupted #1 Vicryl for the deep fascia to elective substantially followed by skin staples. Xeroform and honeycomb dressing applied  PLAN OF CARE: Continue as inpatient  PATIENT DISPOSITION:  PACU - hemodynamically stable.

## 2017-05-19 NOTE — Progress Notes (Signed)
Patient in bed, confused, fidgety. Mittens in place. Vaseline gauze placed on skin tear above left eye. Son in Social workerlaw at bedside. IV fluids infusing. NPO. Waiting on transport to OR for surgery.

## 2017-05-19 NOTE — Clinical Social Work Note (Signed)
Clinical Social Work Assessment  Patient Details  Name: Beth Roberts MRN: [id number] Date of Birth: 08-14-1925  Date of referral:  05/19/17               Reason for consult:  Facility Placement                Permission sought to share information with:  Facility Industrial/product designerContact Representative Permission granted to share information::  Yes, Verbal Permission Granted  Name::        Agency::     Relationship::     Contact Information:     Housing/Transportation Living arrangements for the past 2 months:  Skilled Nursing Facility Source of Information:  Adult Children, Facility Patient Interpreter Needed:  None Criminal Activity/Legal Involvement Pertinent to Current Situation/Hospitalization:  No - Comment as needed Significant Relationships:  Adult Children Lives with:  Facility Resident Do you feel safe going back to the place where you live?  Yes Need for family participation in patient care:  Yes (Comment)  Care giving concerns:  Patient admitted from [facility]   Social Worker assessment / plan:  CSW contacted the patient's daughter, Beth Roberts, to discuss discharge planning. Maree confirmed that the patient is a LTC resident at [facility] and that the family is in agreement with her returning there when stable. At baseline, the client is oriented to self only and is incontinent of bladder and bowel. Joseph from [facility] confirmed that the patient can return at discharge. Patient will most likely discharge Tuesday via non emergent  EMS. CSW will continue to follow for discharge facilitation.  Employment status:  Retired Database administratornsurance information:  Managed Medicare PT Recommendations:  Skilled Nursing Facility Information / Referral to community resources:     Patient/Family's Response to care:  The patient's daughter thanked the CSW for contact.  Patient/Family's Understanding of and Emotional Response to Diagnosis, Current Treatment, and Prognosis:  The family understands the need for continued SNF  level of care and are in agreement.  Emotional Assessment Appearance:  Appears stated age Attitude/Demeanor/Rapport:   (Patient sleeping post op) Affect (typically observed):   (Patient sleeping post op) Orientation:  Oriented to Self Alcohol / Substance use:  Never Used Psych involvement (Current and /or in the community):  No (Comment)  Discharge Needs  Concerns to be addressed:  Care Coordination, Discharge Planning Concerns Readmission within the last 30 days:  No Current discharge risk:  Chronically ill Barriers to Discharge:  Continued Medical Work up   UAL CorporationKaren M Lateria Alderman, LCSW 05/19/2017, 1:44 PM

## 2017-05-19 NOTE — Progress Notes (Signed)
Patient arrived from PACU. Resting in bed comfortably. No family present at bedside currently. VSS. Continue to monitor.

## 2017-05-19 NOTE — Progress Notes (Signed)
[facility] [place] [facility]   PATIENT NAME: Guerry MinorsHazel Prestage    MR#:  [id number]  DATE OF BIRTH:  1925-04-12  SUBJECTIVE:  CHIEF COMPLAINT:   Chief Complaint  Patient presents with  . Fall    -seen in PACU after left hip ORIF for intertrochanteric fracture - vitals stable, sedated  REVIEW OF SYSTEMS:  Review of Systems  Unable to perform ROS: Dementia    DRUG ALLERGIES:   Allergies  Allergen Reactions  . Ace Inhibitors     Voice dysfunction  . Amlodipine     Flushing/ dizziness  . Codeine     REACTION: nausea and vomiting  . Klonopin [Clonazepam]     Dizzy/ sleepless  . Penicillins     REACTION: breaks out  . Requip [Ropinirole Hcl]     Not effective and makes her nervous  . Sulfonamide Derivatives     VITALS:  Blood pressure (!) 145/69, pulse (!) 107, temperature 98.9 F (37.2 C), resp. rate 12, weight 36.3 kg (80 lb), SpO2 98 %.  PHYSICAL EXAMINATION:  Physical Exam  GENERAL:  83 y.o.-year-old elderly, very thin and cachectic patient lying in the bed with no acute distress.  EYES: Pupils equal, round, reactive to light and accommodation. No scleral icterus. Extraocular muscles intact.  HEENT: Head atraumatic, normocephalic. Oropharynx and nasopharynx clear.  NECK:  Supple, no jugular venous distention. No thyroid enlargement, no tenderness.  LUNGS: Normal breath sounds bilaterally, no wheezing, rales,rhonchi or crepitation. No use of accessory muscles of respiration. Decreased bibasilar breath sounds CARDIOVASCULAR: S1, S2 normal. No  rubs, or gallops. 2/6 systolic murmur present ABDOMEN: Soft, nontender, nondistended. Bowel sounds present. No organomegaly or mass.  EXTREMITIES: left hip dressing in place, leg is elevated -No pedal edema, cyanosis, or clubbing.  NEUROLOGIC: sedated , not following commands. PSYCHIATRIC: The patient is sedated.  SKIN: No obvious rash, lesion, or ulcer.    LABORATORY PANEL:   CBC  Recent  Labs Lab 05/19/17 0413  WBC 14.1*  HGB 9.2*  HCT 27.2*  PLT 365   ------------------------------------------------------------------------------------------------------------------  Chemistries   Recent Labs Lab 05/19/17 0413  NA 141  K 4.7  CL 108  CO2 25  GLUCOSE 145*  BUN 35*  CREATININE 0.91  CALCIUM 8.8*   ------------------------------------------------------------------------------------------------------------------  Cardiac Enzymes No results for input(s): TROPONINI in the last 168 hours. ------------------------------------------------------------------------------------------------------------------  RADIOLOGY:  Ct Head Wo Contrast  Result Date: 05/18/2017 CLINICAL DATA:  Witnessed fall today. No loss of consciousness. Left forehead hematoma. EXAM: CT HEAD WITHOUT CONTRAST CT CERVICAL SPINE WITHOUT CONTRAST TECHNIQUE: Multidetector CT imaging of the head and cervical spine was performed following the standard protocol without intravenous contrast. Multiplanar CT image reconstructions of the cervical spine were also generated. COMPARISON:  Most recent CT 10/04/2016 FINDINGS: CT HEAD FINDINGS Brain: Stable degree of atrophy and chronic small vessel ischemia. No intracranial hemorrhage, mass effect, or midline shift. No hydrocephalus. The basilar cisterns are patent. No evidence of territorial infarct. No extra-axial or intracranial fluid collection. Vascular: Atherosclerosis of skullbase vasculature without hyperdense vessel or abnormal calcification. Skull: No fracture or focal lesion. Sinuses/Orbits: Paranasal sinuses and mastoid air cells are clear. The visualized orbits are unremarkable. Bilateral cataract resection. Other: None. CT CERVICAL SPINE FINDINGS Alignment: Stable from prior exam. No traumatic subluxation. Minimal anterolisthesis of C3 on C4 and C7 on T1 is unchanged and likely degenerative. Skull base and vertebrae: No acute fracture. Chronic loss of height of  left aspect of C1 lateral mass.  No primary bone lesion or focal pathologic process. Soft tissues and spinal canal: No prevertebral fluid or swelling. No visible canal hematoma. Disc levels: Diffuse disc space narrowing and endplate spurring. Multilevel facet arthropathy. Degenerative changes are similar to prior exam. Upper chest: No acute abnormality. Other: None. IMPRESSION: 1.  No acute intracranial abnormality.  No skull fracture. 2. Stable degenerative change in the cervical spine without acute fracture. Electronically Signed   By: Rubye Oaks M.D.   On: 05/18/2017 20:00   Ct Cervical Spine Wo Contrast  Result Date: 05/18/2017 CLINICAL DATA:  Witnessed fall today. No loss of consciousness. Left forehead hematoma. EXAM: CT HEAD WITHOUT CONTRAST CT CERVICAL SPINE WITHOUT CONTRAST TECHNIQUE: Multidetector CT imaging of the head and cervical spine was performed following the standard protocol without intravenous contrast. Multiplanar CT image reconstructions of the cervical spine were also generated. COMPARISON:  Most recent CT 10/04/2016 FINDINGS: CT HEAD FINDINGS Brain: Stable degree of atrophy and chronic small vessel ischemia. No intracranial hemorrhage, mass effect, or midline shift. No hydrocephalus. The basilar cisterns are patent. No evidence of territorial infarct. No extra-axial or intracranial fluid collection. Vascular: Atherosclerosis of skullbase vasculature without hyperdense vessel or abnormal calcification. Skull: No fracture or focal lesion. Sinuses/Orbits: Paranasal sinuses and mastoid air cells are clear. The visualized orbits are unremarkable. Bilateral cataract resection. Other: None. CT CERVICAL SPINE FINDINGS Alignment: Stable from prior exam. No traumatic subluxation. Minimal anterolisthesis of C3 on C4 and C7 on T1 is unchanged and likely degenerative. Skull base and vertebrae: No acute fracture. Chronic loss of height of left aspect of C1 lateral mass. No primary bone lesion or  focal pathologic process. Soft tissues and spinal canal: No prevertebral fluid or swelling. No visible canal hematoma. Disc levels: Diffuse disc space narrowing and endplate spurring. Multilevel facet arthropathy. Degenerative changes are similar to prior exam. Upper chest: No acute abnormality. Other: None. IMPRESSION: 1.  No acute intracranial abnormality.  No skull fracture. 2. Stable degenerative change in the cervical spine without acute fracture. Electronically Signed   By: Rubye Oaks M.D.   On: 05/18/2017 20:00   Dg Chest Portable 1 View  Result Date: 05/18/2017 CLINICAL DATA:  Preop, hip fracture history of hypertension EXAM: PORTABLE CHEST 1 VIEW COMPARISON:  07/18/2015 FINDINGS: Hyperinflation with pleural and parenchymal scarring at the right base. No acute consolidation or effusion. Stable cardiomediastinal silhouette with postsurgical changes on the right. Aortic atherosclerosis. No pneumothorax. Postsurgical changes in the right humeral head. Old right fifth rib fracture. IMPRESSION: Stable scarring in the right lung base. No radiographic evidence for acute cardiopulmonary abnormality. Electronically Signed   By: Jasmine Pang M.D.   On: 05/18/2017 20:27   Dg Hip Unilat W Or Wo Pelvis 2-3 Views Left  Result Date: 05/18/2017 CLINICAL DATA:  Fall.  Left hip pain. EXAM: DG HIP (WITH OR WITHOUT PELVIS) 2-3V LEFT COMPARISON:  Left hip radiographs 02/22/2015 FINDINGS: The comminuted intratrochanteric fracture is present in the left hip. There is significant varus angulation. There is foreshortening of the hip as well. The femoral head in remains located. The pelvis is intact. A right hip hemiprosthesis is noted. Degenerative changes are again seen in the lower lumbar spine. Mild osteopenia is noted. IMPRESSION: 1. Comminuted intertrochanteric fracture of the left hip with severe varus angulation. 2. Mild osteopenia. 3. Right hip hemiprosthesis. Electronically Signed   By: Marin Roberts  M.D.   On: 05/18/2017 20:06    EKG:   Orders placed or performed during the hospital encounter  of 05/18/17  . ED EKG  . ED EKG  . EKG 12-Lead  . EKG 12-Lead    ASSESSMENT AND PLAN:   83 year old female with past medical history significant for severe dementia, COPD, GERD, hypertension, osteoporosis who is wheelchair bound at baseline was brought in secondary to a fall and left hip fracture.  #1 left hip intertrochanteric fracture-appreciate orthopedics consult. Status post ORIF today. -Pain control and physical therapy. -DVT prophylaxis to be started by orthopedics. -Watch for any anemia after surgery. Lost about 100 cc of blood during surgery.  #2 dementia and Parkinson's disease without behavioral disturbances-continue Sinemet. -Patient is wheelchair bound at baseline  #3 GERD-on Protonix  #4 hypertension-continue clonidine and losartan  #5 DVT prophylaxis-to be started by orthopedics today  #6 leukocytosis-better than yesterday. Check urine analysis    All the records are reviewed and case discussed with Care Management/Social Workerr. Management plans discussed with the patient, family and they are in agreement.  CODE STATUS: DNR  TOTAL TIME TAKING CARE OF THIS PATIENT: 33 minutes.   POSSIBLE D/C IN 2-3  DAYS, DEPENDING ON CLINICAL CONDITION.   Enid Baas M.D on 05/19/2017 at 12:16 PM  Between 7am to 6pm - Pager - [phone]  After 6pm go to [email or web address] - Social research officer, government  [facility]  Office  [phone]  CC: Primary care physician; Dorothey Baseman, MD

## 2017-05-19 NOTE — Anesthesia Post-op Follow-up Note (Cosign Needed)
Anesthesia QCDR form completed.        

## 2017-05-19 NOTE — NC FL2 (Signed)
[place] MEDICAID FL2 LEVEL OF CARE SCREENING TOOL     IDENTIFICATION  Patient Name: Beth Roberts Birthdate: Mar 30, 1925 Sex: female Admission Date (Current Location): 05/18/2017  [place]   Facility and Address:  [facility], [address], [place], [place] [address]      Provider Number: [id number]  Attending Physician Name and Address:  Enid BaasKalisetti, Radhika, MD  Relative Name and Phone Number:       Current Level of Care: Hospital Recommended Level of Care: Skilled Nursing Facility Prior Approval Number:    Date Approved/Denied:   PASRR Number: [id number]  Discharge Plan: SNF    Current Diagnoses: Patient Active Problem List   Diagnosis Date Noted  . Pressure injury of skin 05/19/2017  . Closed left hip fracture (HCC) 05/18/2017  . Dementia due to Parkinson's disease without behavioral disturbance (HCC) 05/18/2017  . Protein-calorie malnutrition, severe (HCC) 07/19/2015  . Altered mental status 07/18/2015  . Encounter for Medicare annual wellness exam 03/25/2013  . Stress reaction 03/07/2013  . Restless legs syndrome (RLS) 11/25/2012  . Staring spell 11/25/2012  . Hoarseness, chronic 09/25/2012  . Other screening mammogram 09/25/2011  . Varicose veins 09/25/2011  . EPISTAXIS 01/20/2011  . HYPOGLYCEMIA, UNSPECIFIED 11/12/2008  . Seasonal and perennial allergic rhinitis 09/25/2007  . HLD (hyperlipidemia) 04/30/2007  . Essential hypertension 04/30/2007  . COPD 04/30/2007  . GERD 04/30/2007    Orientation RESPIRATION BLADDER Height & Weight     Self  O2 (3L o2) Indwelling catheter Weight: 80 lb (36.3 kg) Height:     BEHAVIORAL SYMPTOMS/MOOD NEUROLOGICAL BOWEL NUTRITION STATUS      Incontinent    AMBULATORY STATUS COMMUNICATION OF NEEDS Skin   Total Care Verbally Surgical wounds, Skin abrasions (Skin tear above left eye)                       Personal Care Assistance Level of Assistance   Bathing, Feeding, Dressing, Total care Bathing Assistance: Maximum assistance Feeding assistance: Maximum assistance Dressing Assistance: Maximum assistance Total Care Assistance: Maximum assistance   Functional Limitations Info             SPECIAL CARE FACTORS FREQUENCY  PT (By licensed PT)     PT Frequency: Up to 5X per day, 5 days per week               Contractures      Additional Factors Info  Code Status, Allergies Code Status Info: DNR Allergies Info: Ace Inhibitors, Amlodipine, Codeine, Klonopin Clonazepam, Penicillins, Requip Ropinirole Hcl, Sulfonamide Derivatives           Current Medications (05/19/2017):  This is the current hospital active medication list Current Facility-Administered Medications  Medication Dose Route Frequency Provider Last Rate Last Dose  . 0.9 %  sodium chloride infusion   Intravenous Continuous Kennedy BuckerMenz, Michael, MD      . acetaminophen (TYLENOL) tablet 650 mg  650 mg Oral Q6H PRN Oralia ManisWillis, David, MD       Or  . acetaminophen (TYLENOL) suppository 650 mg  650 mg Rectal Q6H PRN Oralia ManisWillis, David, MD      . alum & mag hydroxide-simeth (MAALOX/MYLANTA) 200-200-20 MG/5ML suspension 30 mL  30 mL Oral Q4H PRN Kennedy BuckerMenz, Michael, MD      . bisacodyl (DULCOLAX) suppository 10 mg  10 mg Rectal Daily PRN Kennedy BuckerMenz, Michael, MD      . carbidopa-levodopa (SINEMET IR) 10-100 MG per tablet immediate release 1  tablet  1 tablet Oral TID Oralia Manis, MD      . clindamycin (CLEOCIN) IVPB 600 mg  600 mg Intravenous Q6H Kennedy Bucker, MD      . clonazePAM Scarlette Calico) tablet 0.5 mg  0.5 mg Oral QHS PRN Oralia Manis, MD      . cloNIDine (CATAPRES) tablet 0.1 mg  0.1 mg Oral Daily Oralia Manis, MD      . docusate sodium (COLACE) capsule 100 mg  100 mg Oral BID Kennedy Bucker, MD      . Melene Muller ON 05/20/2017] enoxaparin (LOVENOX) injection 30 mg  30 mg Subcutaneous Q24H Kennedy Bucker, MD      . HYDROcodone-acetaminophen (NORCO/VICODIN) 5-325 MG per tablet 1-2 tablet  1-2  tablet Oral Q6H PRN Kennedy Bucker, MD      . losartan (COZAAR) tablet 50 mg  50 mg Oral Daily Oralia Manis, MD      . magnesium citrate solution 1 Bottle  1 Bottle Oral Once PRN Kennedy Bucker, MD      . magnesium hydroxide (MILK OF MAGNESIA) suspension 30 mL  30 mL Oral Daily PRN Kennedy Bucker, MD      . menthol-cetylpyridinium (CEPACOL) lozenge 3 mg  1 lozenge Oral PRN Kennedy Bucker, MD       Or  . phenol (CHLORASEPTIC) mouth spray 1 spray  1 spray Mouth/Throat PRN Kennedy Bucker, MD      . methocarbamol (ROBAXIN) tablet 500 mg  500 mg Oral Q6H PRN Kennedy Bucker, MD       Or  . methocarbamol (ROBAXIN) 500 mg in dextrose 5 % 50 mL IVPB  500 mg Intravenous Q6H PRN Kennedy Bucker, MD      . metoCLOPramide (REGLAN) tablet 5-10 mg  5-10 mg Oral Q8H PRN Kennedy Bucker, MD       Or  . metoCLOPramide (REGLAN) injection 5-10 mg  5-10 mg Intravenous Q8H PRN Kennedy Bucker, MD      . morphine 2 MG/ML injection 2 mg  2 mg Intravenous Q4H PRN Oralia Manis, MD      . ondansetron [facility]) tablet 4 mg  4 mg Oral Q6H PRN Oralia Manis, MD       Or  . ondansetron [facility]) injection 4 mg  4 mg Intravenous Q6H PRN Oralia Manis, MD      . oxyCODONE (Oxy IR/ROXICODONE) immediate release tablet 5 mg  5 mg Oral Q4H PRN Oralia Manis, MD      . pantoprazole (PROTONIX) EC tablet 40 mg  40 mg Oral Daily Oralia Manis, MD      . Polyethyl Glycol-Propyl Glycol 0.4-0.3 % SOLN 1 drop  1 drop Ophthalmic QID Oralia Manis, MD   1 drop at 05/18/17 2246  . pramipexole (MIRAPEX) tablet 0.125 mg  0.125 mg Oral BID Oralia Manis, MD      . senna-docusate (Senokot-S) tablet 1 tablet  1 tablet Oral BID PRN Oralia Manis, MD         Discharge Medications: Please see discharge summary for a list of discharge medications.  Relevant Imaging Results:  Relevant Lab Results:   Additional Information SS# [id number]  Judi Cong, LCSW

## 2017-05-19 NOTE — Transfer of Care (Signed)
Immediate Anesthesia Transfer of Care Note  Patient: Beth MoodHazel J Bivins  Procedure(s) Performed: Procedure(s): INTRAMEDULLARY (IM) NAIL INTERTROCHANTRIC (Left)  Patient Location: PACU  Anesthesia Type:General  Level of Consciousness: sedated  Airway & Oxygen Therapy: Patient Spontanous Breathing and Patient connected to face mask oxygen  Post-op Assessment: Report given to RN and Post -op Vital signs reviewed and stable  Post vital signs: Reviewed and stable  Last Vitals:  Vitals:   05/18/17 2210 05/19/17 0743  BP: (!) 135/57 140/60  Pulse: 94 (!) 107  Resp: 18 17  Temp: 37 C 37.8 C    Last Pain:  Vitals:   05/19/17 0743  TempSrc: Oral         Complications: No apparent anesthesia complications

## 2017-05-19 NOTE — Consult Note (Signed)
83 year old woman with dementia suffered a fall last evening at [facility]. She has a complex intertrochanteric fracture. She just does transfers but is in severe pain. Her dementia severe and limits her ability to ambulate. On exam her left leg is flexed and internally rotated with moderate swelling but no ecchymosis. X-rays show a complex subtrochanteric fracture with long extension. Impression is left intertrochanteric subdural fracture Plan is for ORIF with IM rod possibly with cerclage wire. I discussed treatment plans with her son-in-law. With her severe pain and recommend this for palliative measure even if we don't expect her to become ambulatory. It should also allow for mobilization up to a chair. Risks, benefits, were discussed

## 2017-05-20 LAB — BASIC METABOLIC PANEL
ANION GAP: 6 (ref 5–15)
BUN: 34 mg/dL — ABNORMAL HIGH (ref 6–20)
CHLORIDE: 111 mmol/L (ref 101–111)
CO2: 24 mmol/L (ref 22–32)
Calcium: 8.1 mg/dL — ABNORMAL LOW (ref 8.9–10.3)
Creatinine, Ser: 0.81 mg/dL (ref 0.44–1.00)
GFR calc non Af Amer: >60 mL/min (ref >60)
GLUCOSE: 143 mg/dL — AB (ref 65–99)
POTASSIUM: 3.7 mmol/L (ref 3.5–5.1)
Sodium: 141 mmol/L (ref 135–145)

## 2017-05-20 LAB — CBC
HEMATOCRIT: 20.1 % — AB (ref 35.0–47.0)
HEMOGLOBIN: 6.8 g/dL — AB (ref 12.0–16.0)
MCH: 34.6 pg — ABNORMAL HIGH (ref 26.0–34.0)
MCHC: 33.8 g/dL (ref 32.0–36.0)
MCV: 102.3 fL — AB (ref 80.0–100.0)
Platelets: 251 10*3/uL (ref 150–440)
RBC: 1.97 MIL/uL — ABNORMAL LOW (ref 3.80–5.20)
RDW: 12.9 % (ref 11.5–14.5)
WBC: 11.7 10*3/uL — AB (ref 3.6–11.0)

## 2017-05-20 LAB — ABO/RH: ABO/RH(D): A POS

## 2017-05-20 LAB — HEMOGLOBIN: Hemoglobin: 6.6 g/dL — ABNORMAL LOW (ref 12.0–16.0)

## 2017-05-20 LAB — PREPARE RBC (CROSSMATCH)

## 2017-05-20 MED ORDER — SODIUM CHLORIDE 0.9 % IV SOLN
Freq: Once | INTRAVENOUS | Status: AC
  Administered 2017-05-20: 09:00:00 via INTRAVENOUS

## 2017-05-20 MED ORDER — DEXTROSE 5 % IV SOLN
1.0000 g | INTRAVENOUS | Status: DC
  Administered 2017-05-20 – 2017-05-21 (×2): 1 g via INTRAVENOUS
  Filled 2017-05-20 (×4): qty 10

## 2017-05-20 NOTE — Progress Notes (Signed)
  Subjective: 1 Day Post-Op Procedure(s) (LRB): INTRAMEDULLARY (IM) NAIL INTERTROCHANTRIC (Left) Patient is not able to verbalize a pain score to me. Patient is well, currently receiving a blood transfusion Plan is to go Skilled nursing facility after hospital stay. Negative for chest pain and shortness of breath Fever: Most recent temp 100. Gastrointestinal:Negative for nausea and vomiting  Objective: Vital signs in last 24 hours: Temp:  [98.4 F (36.9 C)-100 F (37.8 C)] 100 F (37.8 C) (07/15 0852) Pulse Rate:  [90-112] 104 (07/15 0852) Resp:  [8-18] 18 (07/15 0852) BP: (74-160)/(44-109) 112/44 (07/15 0852) SpO2:  [90 %-100 %] 98 % (07/15 0852) FiO2 (%):  [32 %] 32 % (07/14 1318)  Intake/Output from previous day:  Intake/Output Summary (Last 24 hours) at 05/20/17 0854 Last data filed at 05/20/17 0740  Gross per 24 hour  Intake           3067.5 ml  Output              675 ml  Net           2392.5 ml    Intake/Output this shift: Total I/O In: 148.8 [I.V.:148.8] Out: -   Labs:  Recent Labs  05/18/17 2052 05/19/17 0413 05/20/17 0339 05/20/17 0755  HGB 10.5* 9.2* 6.8* 6.6*    Recent Labs  05/19/17 0413 05/20/17 0339  WBC 14.1* 11.7*  RBC 2.71* 1.97*  HCT 27.2* 20.1*  PLT 365 251    Recent Labs  05/19/17 0413 05/20/17 0339  NA 141 141  K 4.7 3.7  CL 108 111  CO2 25 24  BUN 35* 34*  CREATININE 0.91 0.81  GLUCOSE 145* 143*  CALCIUM 8.8* 8.1*    Recent Labs  05/18/17 2052  INR 1.08     EXAM General - Patient is Disorganized, Confused and Lacking Extremity - Intact pulses distally Dorsiflexion/Plantar flexion intact Incision: moderate drainage Dressing/Incision - blood tinged drainage to the left hip incision, no signs of infection. Motor Function - intact, moving foot and toes well on exam.  Past Medical History:  Diagnosis Date  . Allergy    allergic rhinitis  . COPD (chronic obstructive pulmonary disease) (HCC)    congenital Lung  Deformity//Scar tissue on lung( cxr) after surgery  )  . Dementia   . GERD (gastroesophageal reflux disease)    hiatal henia  . Hyperlipidemia   . Hypertension   . Osteoporosis   . Parkinson disease (HCC)     Assessment/Plan: 1 Day Post-Op Procedure(s) (LRB): INTRAMEDULLARY (IM) NAIL INTERTROCHANTRIC (Left) Principal Problem:   Closed left hip fracture (HCC) Active Problems:   HLD (hyperlipidemia)   Essential hypertension   GERD   Dementia due to Parkinson's disease without behavioral disturbance (HCC)   Pressure injury of skin  Estimated body mass index is 15.12 kg/m as calculated from the following:   Height as of 03/09/16: 5\' 1"  (1.549 m).   Weight as of this encounter: 36.3 kg (80 lb). Advance diet Up with therapy D/C IV fluids when tolerating po intake.  Labs reviewed, Hg 6.6 this AM, currently receiving blood transfusion, will recheck Hg afterwards. Up with therapy today if the patient is able to tolerate it. Care management to assist with discharge planning, will need SNF placement. CBC and BMP ordered for tomorrow morning.  DVT Prophylaxis - Lovenox, Foot Pumps and TED hose Weight-Bearing as tolerated to left leg  J. Horris LatinoLance Arath Kaigler, PA-C [facility] 05/20/2017, 8:54 AM

## 2017-05-20 NOTE — Evaluation (Signed)
Physical Therapy Evaluation Patient Details Name: Beth Roberts MRN: [id number] DOB: Dec 05, 1924 Today's Date: 05/20/2017   History of Present Illness  Beth Roberts is a 83yo white female who comes to [facility] on 7/13 from [facility] after fall, subsequent hip fracture, IM nailing on 7/14, presenting on POD1. At baseline, pt is essentially WC bound, was performing transfers bed/WC wtih assistance. PMH: dementia, PD, HLD, HTN, GERD, Lt shoulder ORIF (2008), Rt HHA (2016). Since admission, pt noted to have (+)UA now starting ABX on 7/15, and Hb/HCT drop, receiving 2units PRBC on 7/15.   Clinical Impression  Pt admitted with above diagnosis. Pt currently with functional limitations due to the deficits listed below (see "PT Problem List"). Upon entry, the patient is received semirecumbent in bed, no family/caregiver present. The pt is awake, but minimally interactive, no making eye contact or responding to verbal stimulus for first 2-3 minutes. No acute distress noted at this time. The pt is alert, but orientation is limited at this time. Verbal responses to questioning are appropriate/meaningful only 10-20% of time, with pt frequently hypophonic, often inaudible, with what sounds like intermittent gibberish, song, or multiplication times tables. Physical response to verbal commands/visual cues/tactile cues is also 10-20% of time, difficulty eliciting A/ROM, allowing P/ROM, and coordinating P/ROM. These cognitive deficits are akin to late stage dementia language deficits, however, it is unknown if they are baseline, or exacerbated by UTI imposed on dementia. Pt is asked if she is thirsty, to which she says yes, she is offered a straw, drinks without effort for 30 seconds, no S/Sx asp, then maintains a bite on the straw, requiring several repeated verbal cues to release. Pt does perform some movement of the LLE (somewhat apraxic) when cued and strength/ROM appear minimally impaired, with no clear signs of pain.  At this time, the patient's ability to benefit from skilled PT intervention is limited by cognitive deficits, however additional detail is needed from caregivers. Cognition also may be subject to change as UTI resolves. Pt will benefit from skilled PT intervention to increase independence and safety with basic mobility in preparation for discharge to the venue listed below.       Follow Up Recommendations SNF;Supervision/Assistance - 24 hour    Equipment Recommendations       Recommendations for Other Services       Precautions / Restrictions Precautions Precautions: Fall Restrictions Weight Bearing Restrictions: Yes LLE Weight Bearing: Weight bearing as tolerated      Mobility  Bed Mobility Overal bed mobility:  (no performed at this time due to inability to follow commands; will consider attempting at later time with (+)UA in mind. )                Transfers                    Ambulation/Gait                Stairs            Wheelchair Mobility    Modified Rankin (Stroke Patients Only)       Balance                                             Pertinent Vitals/Pain Pain Assessment:  (no verbal or gestural indication of pain during session)    Home Living Family/patient expects  to be discharged to:: Skilled nursing facility                      Prior Function Level of Independence: Needs assistance   Gait / Transfers Assistance Needed: WC bound at baseline, performs standing pivot transfers with assistance           Hand Dominance        Extremity/Trunk Assessment   Upper Extremity Assessment Upper Extremity Assessment: Difficult to assess due to impaired cognition    Lower Extremity Assessment Lower Extremity Assessment: Difficult to assess due to impaired cognition       Communication      Cognition Arousal/Alertness:  (awake) Behavior During Therapy: Flat affect (intermittent confusion  ) Overall Cognitive Status: History of cognitive impairments - at baseline (no familiy to determine baseline; )                                 General Comments: meaningful/appropriate responses to interaction <20% of time.       General Comments      Exercises General Exercises - Lower Extremity Ankle Circles/Pumps: 15 reps;PROM;AAROM;Supine;Both Short Arc Quad: Both;15 reps;Supine;PROM;AAROM Heel Slides: PROM;AAROM;Both;15 reps;Supine Shoulder Exercises Elbow Flexion: PROM;AAROM;Both;5 reps Elbow Extension: PROM;AAROM;Both;5 reps   Assessment/Plan    PT Assessment Patient needs continued PT services  PT Problem List Decreased range of motion;Decreased balance;Decreased mobility;Decreased cognition       PT Treatment Interventions Functional mobility training;Therapeutic activities;Therapeutic exercise;Balance training;Patient/family education    PT Goals (Current goals can be found in the Care Plan section)  Acute Rehab PT Goals PT Goal Formulation: Patient unable to participate in goal setting    Frequency BID   Barriers to discharge        Co-evaluation               AM-PAC PT "6 Clicks" Daily Activity  Outcome Measure Difficulty turning over in bed (including adjusting bedclothes, sheets and blankets)?: Total Difficulty moving from lying on back to sitting on the side of the bed? : Total Difficulty sitting down on and standing up from a chair with arms (e.g., wheelchair, bedside commode, etc,.)?: Total Help needed moving to and from a bed to chair (including a wheelchair)?: Total Help needed walking in hospital room?: Total Help needed climbing 3-5 steps with a railing? : Total 6 Click Score: 6    End of Session Equipment Utilized During Treatment: Gait belt;Oxygen Activity Tolerance: Patient tolerated treatment well;Other (comment) (limited by cognitive defcitis ) Patient left: in bed;with bed alarm set;Other (comment) (mittens donned;  bed 'chaired-out') Nurse Communication: Mobility status PT Visit Diagnosis: History of falling (Z91.81);Other abnormalities of gait and mobility (R26.89)    Time: [id number] PT Time Calculation (min) (ACUTE ONLY): 19 min   Charges:   PT Evaluation $PT Eval High Complexity: 1 Procedure     PT G Codes:        3:23 PM, 05/20/17 Rosamaria LintsAllan C Natiya Seelinger, PT, DPT Physical Therapist - [place] [phone] (ASCOM)  [phone] (mobile)   Hermie Reagor C 05/20/2017, 3:12 PM

## 2017-05-20 NOTE — Progress Notes (Signed)
Patient laying in bed restless with mittens in place this morning. Pain meds given with relief. Dressing on left hip has moderate drainage, MD aware. HGB 6.6, orders to transfuse 2 units prbc. First unit infusing now. Patient ate a few bites of breakfast and then refused the rest. Currently resting in bed with ice pack on left hip, foot pumps in place. No signs of distress. Continue to monitor.

## 2017-05-20 NOTE — Progress Notes (Signed)
Return call from MD, no new orders at this time.

## 2017-05-20 NOTE — Progress Notes (Signed)
Paged MD for hgb of 6.8

## 2017-05-20 NOTE — Progress Notes (Addendum)
[facility] - [place] [facility]   PATIENT NAME: Beth MinorsHazel Roberts    MR#:  [id number]  DATE OF BIRTH:  06-09-1925  SUBJECTIVE:  CHIEF COMPLAINT:   Chief Complaint  Patient presents with  . Fall    -Postop day 1 after left hip surgery. Hemoglobin dropped and some bleeding noted at the surgical site saturating the dressing. -Will receive transfusions today  REVIEW OF SYSTEMS:  Review of Systems  Unable to perform ROS: Dementia    DRUG ALLERGIES:   Allergies  Allergen Reactions  . Ace Inhibitors     Voice dysfunction  . Amlodipine     Flushing/ dizziness  . Codeine     REACTION: nausea and vomiting  . Klonopin [Clonazepam]     Dizzy/ sleepless  . Penicillins     REACTION: breaks out  . Requip [Ropinirole Hcl]     Not effective and makes her nervous  . Sulfonamide Derivatives     VITALS:  Blood pressure (!) 112/44, pulse (!) 104, temperature 100 F (37.8 C), temperature source Axillary, resp. rate 18, weight 36.3 kg (80 lb), SpO2 98 %.  PHYSICAL EXAMINATION:  Physical Exam  GENERAL:  83 y.o.-year-old elderly, very thin and cachectic patient lying in the bed with no acute distress.  EYES: Pupils equal, round, reactive to light and accommodation. No scleral icterus. Extraocular muscles intact.  HEENT: Head atraumatic, normocephalic. Oropharynx and nasopharynx clear.  NECK:  Supple, no jugular venous distention. No thyroid enlargement, no tenderness.  LUNGS: Normal breath sounds bilaterally, no wheezing, rales,rhonchi or crepitation. No use of accessory muscles of respiration. Decreased bibasilar breath sounds CARDIOVASCULAR: S1, S2 normal. No  rubs, or gallops. 2/6 systolic murmur present ABDOMEN: Soft, nontender, nondistended. Bowel sounds present. No organomegaly or mass.  EXTREMITIES: left hip dressing in place, Increased swelling, ecchymosis and some active bleeding saturating the dressing is noted. -No pedal edema, cyanosis, or clubbing.    NEUROLOGIC: Mumbling to herself, alert , not following commands. PSYCHIATRIC: The patient is alert but not following commands due to her dementia.  SKIN: No obvious rash, lesion, or ulcer.    LABORATORY PANEL:   CBC  Recent Labs Lab 05/20/17 0339 05/20/17 0755  WBC 11.7*  --   HGB 6.8* 6.6*  HCT 20.1*  --   PLT 251  --    ------------------------------------------------------------------------------------------------------------------  Chemistries   Recent Labs Lab 05/20/17 0339  NA 141  K 3.7  CL 111  CO2 24  GLUCOSE 143*  BUN 34*  CREATININE 0.81  CALCIUM 8.1*   ------------------------------------------------------------------------------------------------------------------  Cardiac Enzymes No results for input(s): TROPONINI in the last 168 hours. ------------------------------------------------------------------------------------------------------------------  RADIOLOGY:  Ct Head Wo Contrast  Result Date: 05/18/2017 CLINICAL DATA:  Witnessed fall today. No loss of consciousness. Left forehead hematoma. EXAM: CT HEAD WITHOUT CONTRAST CT CERVICAL SPINE WITHOUT CONTRAST TECHNIQUE: Multidetector CT imaging of the head and cervical spine was performed following the standard protocol without intravenous contrast. Multiplanar CT image reconstructions of the cervical spine were also generated. COMPARISON:  Most recent CT 10/04/2016 FINDINGS: CT HEAD FINDINGS Brain: Stable degree of atrophy and chronic small vessel ischemia. No intracranial hemorrhage, mass effect, or midline shift. No hydrocephalus. The basilar cisterns are patent. No evidence of territorial infarct. No extra-axial or intracranial fluid collection. Vascular: Atherosclerosis of skullbase vasculature without hyperdense vessel or abnormal calcification. Skull: No fracture or focal lesion. Sinuses/Orbits: Paranasal sinuses and mastoid air cells are clear. The visualized orbits are unremarkable. Bilateral cataract  resection. Other:  None. CT CERVICAL SPINE FINDINGS Alignment: Stable from prior exam. No traumatic subluxation. Minimal anterolisthesis of C3 on C4 and C7 on T1 is unchanged and likely degenerative. Skull base and vertebrae: No acute fracture. Chronic loss of height of left aspect of C1 lateral mass. No primary bone lesion or focal pathologic process. Soft tissues and spinal canal: No prevertebral fluid or swelling. No visible canal hematoma. Disc levels: Diffuse disc space narrowing and endplate spurring. Multilevel facet arthropathy. Degenerative changes are similar to prior exam. Upper chest: No acute abnormality. Other: None. IMPRESSION: 1.  No acute intracranial abnormality.  No skull fracture. 2. Stable degenerative change in the cervical spine without acute fracture. Electronically Signed   By: Rubye Oaks M.D.   On: 05/18/2017 20:00   Ct Cervical Spine Wo Contrast  Result Date: 05/18/2017 CLINICAL DATA:  Witnessed fall today. No loss of consciousness. Left forehead hematoma. EXAM: CT HEAD WITHOUT CONTRAST CT CERVICAL SPINE WITHOUT CONTRAST TECHNIQUE: Multidetector CT imaging of the head and cervical spine was performed following the standard protocol without intravenous contrast. Multiplanar CT image reconstructions of the cervical spine were also generated. COMPARISON:  Most recent CT 10/04/2016 FINDINGS: CT HEAD FINDINGS Brain: Stable degree of atrophy and chronic small vessel ischemia. No intracranial hemorrhage, mass effect, or midline shift. No hydrocephalus. The basilar cisterns are patent. No evidence of territorial infarct. No extra-axial or intracranial fluid collection. Vascular: Atherosclerosis of skullbase vasculature without hyperdense vessel or abnormal calcification. Skull: No fracture or focal lesion. Sinuses/Orbits: Paranasal sinuses and mastoid air cells are clear. The visualized orbits are unremarkable. Bilateral cataract resection. Other: None. CT CERVICAL SPINE FINDINGS  Alignment: Stable from prior exam. No traumatic subluxation. Minimal anterolisthesis of C3 on C4 and C7 on T1 is unchanged and likely degenerative. Skull base and vertebrae: No acute fracture. Chronic loss of height of left aspect of C1 lateral mass. No primary bone lesion or focal pathologic process. Soft tissues and spinal canal: No prevertebral fluid or swelling. No visible canal hematoma. Disc levels: Diffuse disc space narrowing and endplate spurring. Multilevel facet arthropathy. Degenerative changes are similar to prior exam. Upper chest: No acute abnormality. Other: None. IMPRESSION: 1.  No acute intracranial abnormality.  No skull fracture. 2. Stable degenerative change in the cervical spine without acute fracture. Electronically Signed   By: Rubye Oaks M.D.   On: 05/18/2017 20:00   Dg Chest Portable 1 View  Result Date: 05/18/2017 CLINICAL DATA:  Preop, hip fracture history of hypertension EXAM: PORTABLE CHEST 1 VIEW COMPARISON:  07/18/2015 FINDINGS: Hyperinflation with pleural and parenchymal scarring at the right base. No acute consolidation or effusion. Stable cardiomediastinal silhouette with postsurgical changes on the right. Aortic atherosclerosis. No pneumothorax. Postsurgical changes in the right humeral head. Old right fifth rib fracture. IMPRESSION: Stable scarring in the right lung base. No radiographic evidence for acute cardiopulmonary abnormality. Electronically Signed   By: Jasmine Pang M.D.   On: 05/18/2017 20:27   Dg Hip Operative Unilat W Or W/o Pelvis Left  Result Date: 05/19/2017 CLINICAL DATA:  Left hip fracture. EXAM: OPERATIVE LEFT HIP (WITH PELVIS IF PERFORMED) 4 VIEWS TECHNIQUE: Fluoroscopic spot image(s) were submitted for interpretation post-operatively. COMPARISON:  05/18/2017. FINDINGS: Four C-arm views of the left hip and femur demonstrate compression screw and rod fixation of the previously demonstrated intertrochanteric fracture. There is angulation is noted.  There is also 2/3 shaft width of anterior displacement and posterior angulation of the distal fragment. IMPRESSION: Hardware fixation of the previously demonstrated intertrochanteric fracture  with displacement and angulation, as described above. Electronically Signed   By: Beckie Salts M.D.   On: 05/19/2017 12:16   Dg Hip Unilat W Or Wo Pelvis 2-3 Views Left  Result Date: 05/18/2017 CLINICAL DATA:  Fall.  Left hip pain. EXAM: DG HIP (WITH OR WITHOUT PELVIS) 2-3V LEFT COMPARISON:  Left hip radiographs 02/22/2015 FINDINGS: The comminuted intratrochanteric fracture is present in the left hip. There is significant varus angulation. There is foreshortening of the hip as well. The femoral head in remains located. The pelvis is intact. A right hip hemiprosthesis is noted. Degenerative changes are again seen in the lower lumbar spine. Mild osteopenia is noted. IMPRESSION: 1. Comminuted intertrochanteric fracture of the left hip with severe varus angulation. 2. Mild osteopenia. 3. Right hip hemiprosthesis. Electronically Signed   By: Marin Roberts M.D.   On: 05/18/2017 20:06    EKG:   Orders placed or performed during the hospital encounter of 05/18/17  . ED EKG  . ED EKG  . EKG 12-Lead  . EKG 12-Lead    ASSESSMENT AND PLAN:   83 year old female with past medical history significant for severe dementia, COPD, GERD, hypertension, osteoporosis who is wheelchair bound at baseline was brought in secondary to a fall and left hip fracture.  #1 left hip intertrochanteric fracture-appreciate orthopedics consult. Status post ORIF, Postop day 1 today. -Pain control and physical therapy. -DVT prophylaxis. - #2 acute anemia-noted after surgery. Only 100 cc blood loss during the procedure. There is swelling and ecchymosis at the surgical site. -2 units packed RBC transfusion ordered due to low blood pressure and also tachycardia -Monitor hemoglobin later today  #3 dementia and Parkinson's disease  without behavioral disturbances-continue Sinemet. -Patient is wheelchair bound at baseline Not oriented   #4 GERD-on Protonix  #5 hypertension-continue clonidine and losartan  #6 DVT prophylaxis- Started on Lovenox  #7 Acute cystitis-leukocytosis with fevers, UA positive for infection. Send for urine cultures and started on Rocephin    All the records are reviewed and case discussed with Care Management/Social Workerr. Management plans discussed with the patient, family and they are in agreement.  CODE STATUS: DNR  TOTAL TIME TAKING CARE OF THIS PATIENT: 33 minutes.   POSSIBLE D/C IN 1-2  DAYS, DEPENDING ON CLINICAL CONDITION.   Enid Baas M.D on 05/20/2017 at 9:09 AM  Between 7am to 6pm - Pager - [phone]  After 6pm go to [email or web address] - Social research officer, government  [facility]  Office  [phone]  CC: Primary care physician; Dorothey Baseman, MD

## 2017-05-20 NOTE — Progress Notes (Signed)
First transfusion is completed, patient tolerated well. Second blood transfusion infusing. Daughter at bedside feeding patient lunch. Continue to monitor.

## 2017-05-21 ENCOUNTER — Encounter: Payer: Self-pay | Admitting: Orthopedic Surgery

## 2017-05-21 LAB — CBC
HCT: 29.5 % — ABNORMAL LOW (ref 35.0–47.0)
Hemoglobin: 10.1 g/dL — ABNORMAL LOW (ref 12.0–16.0)
MCH: 31.7 pg (ref 26.0–34.0)
MCHC: 34.3 g/dL (ref 32.0–36.0)
MCV: 92.5 fL (ref 80.0–100.0)
PLATELETS: 196 10*3/uL (ref 150–440)
RBC: 3.19 MIL/uL — AB (ref 3.80–5.20)
RDW: 19.3 % — ABNORMAL HIGH (ref 11.5–14.5)
WBC: 12.7 10*3/uL — ABNORMAL HIGH (ref 3.6–11.0)

## 2017-05-21 LAB — TYPE AND SCREEN
ABO/RH(D): A POS
Antibody Screen: NEGATIVE
UNIT DIVISION: 0
Unit division: 0

## 2017-05-21 LAB — BASIC METABOLIC PANEL
Anion gap: 6 (ref 5–15)
BUN: 22 mg/dL — AB (ref 6–20)
CHLORIDE: 111 mmol/L (ref 101–111)
CO2: 25 mmol/L (ref 22–32)
CREATININE: 0.53 mg/dL (ref 0.44–1.00)
Calcium: 8.4 mg/dL — ABNORMAL LOW (ref 8.9–10.3)
GFR calc Af Amer: >60 mL/min (ref >60)
GLUCOSE: 126 mg/dL — AB (ref 65–99)
POTASSIUM: 4.2 mmol/L (ref 3.5–5.1)
Sodium: 142 mmol/L (ref 135–145)

## 2017-05-21 LAB — BPAM RBC
BLOOD PRODUCT EXPIRATION DATE: 201807242359
Blood Product Expiration Date: 201807242359
ISSUE DATE / TIME: 201807150825
ISSUE DATE / TIME: 201807151118
UNIT TYPE AND RH: 6200
Unit Type and Rh: 6200

## 2017-05-21 MED ORDER — ENOXAPARIN SODIUM 30 MG/0.3ML ~~LOC~~ SOLN: 30.0000 mg | SUBCUTANEOUS | 0 refills | Status: DC

## 2017-05-21 MED ORDER — PRAMIPEXOLE DIHYDROCHLORIDE 0.25 MG PO TABS
0.1250 mg | ORAL_TABLET | Freq: Every day | ORAL | Status: DC
  Administered 2017-05-22: 0.125 mg via ORAL
  Filled 2017-05-21: qty 1

## 2017-05-21 MED ORDER — ENSURE ENLIVE PO LIQD
237.0000 mL | Freq: Two times a day (BID) | ORAL | Status: DC
  Administered 2017-05-21: 237 mL via ORAL

## 2017-05-21 MED ORDER — HYDROCODONE-ACETAMINOPHEN 5-325 MG PO TABS: 1.0000 | ORAL_TABLET | Freq: Four times a day (QID) | ORAL | 0 refills | Status: AC | PRN

## 2017-05-21 MED ORDER — PRAMIPEXOLE DIHYDROCHLORIDE 0.25 MG PO TABS
0.2500 mg | ORAL_TABLET | Freq: Every day | ORAL | Status: DC
  Administered 2017-05-21: 0.25 mg via ORAL
  Filled 2017-05-21: qty 1

## 2017-05-21 NOTE — Progress Notes (Signed)
Visit made. Patient is currently followed [facility] with a hospice diagnosis of severe protein calorie malnutrition. She is a DNR code with MOST form in [facility] at [facility]. [facility]/13 following a witnessed fall with no loss of consciousness. She was found to have a left forehead hematoma and xray revealed a comminuted intertrochanteric fracture of the left hip. Hospice was not notified. She has had ORIF which was done per chart note review for pain relief.  Patient seen lying in bed alert, pleasantly confused, denied pain. No family at bedside. Per discussion with [facility] and chart note review,  plan is for return to [facility] tomorrow 7/17. to resume hospice services, no rehab planned. Hospice team updated. Hospital care team aware that patient is receiving hospice services. Thank you. Beth BarkerKaren robertson RN, BSN, [facility], hospital liaison [phone] c

## 2017-05-21 NOTE — Discharge Instructions (Signed)
INSTRUCTIONS AFTER Surgery  o Remove items at home which could result in a fall. This includes throw rugs or furniture in walking pathways o ICE to the affected joint every three hours while awake for 30 minutes at a time, for at least the first 3-5 days, and then as needed for pain and swelling.  Continue to use ice for pain and swelling. You may notice swelling that will progress down to the foot and ankle.  This is normal after surgery.  Elevate your leg when you are not up walking on it.   o Continue to use the breathing machine you got in the hospital (incentive spirometer) which will help keep your temperature down.  It is common for your temperature to cycle up and down following surgery, especially at night when you are not up moving around and exerting yourself.  The breathing machine keeps your lungs expanded and your temperature down.   DIET:  As you were doing prior to hospitalization, we recommend a well-balanced diet.  DRESSING / WOUND CARE / SHOWERING  Surgical bandage is waterproof. Watch for increased drainage and change if needed. Staples will be removed in orthopedics at a 2 week postop appointment. Allow to get dressing wet if a waterproof bandages in place.  ACTIVITY  o Increase activity slowly as tolerated, but follow the weight bearing instructions below.   o No driving for 6 weeks or until further direction given by your physician.  You cannot drive while taking narcotics.  o No lifting or carrying greater than 10 lbs. until further directed by your surgeon. o Avoid periods of inactivity such as sitting longer than an hour when not asleep. This helps prevent blood clots.  o You may return to work once you are authorized by your doctor.     WEIGHT BEARING  Weight-bearing as tolerated to the left leg.   EXERCISES Range of motion and gentle leg strengthening. The patient was nonambulatory and wheelchair bound before surgery.  CONSTIPATION  Constipation is defined  medically as fewer than three stools per week and severe constipation as less than one stool per week.  Even if you have a regular bowel pattern at home, your normal regimen is likely to be disrupted due to multiple reasons following surgery.  Combination of anesthesia, postoperative narcotics, change in appetite and fluid intake all can affect your bowels.   YOU MUST use at least one of the following options; they are listed in order of increasing strength to get the job done.  They are all available over the counter, and you may need to use some, POSSIBLY even all of these options:    Drink plenty of fluids (prune juice may be helpful) and high fiber foods Colace 100 mg by mouth twice a day  Senokot for constipation as directed and as needed Dulcolax (bisacodyl), take with full glass of water  Miralax (polyethylene glycol) once or twice a day as needed.  If you have tried all these things and are unable to have a bowel movement in the first 3-4 days after surgery call either your surgeon or your primary doctor.    If you experience loose stools or diarrhea, hold the medications until you stool forms back up.  If your symptoms do not get better within 1 week or if they get worse, check with your doctor.  If you experience "the worst abdominal pain ever" or develop nausea or vomiting, please contact the office immediately for further recommendations for treatment.  ITCHING:  If you experience itching with your medications, try taking only a single pain pill, or even half a pain pill at a time.  You can also use Benadryl over the counter for itching or also to help with sleep.   TED HOSE STOCKINGS:  Use stockings on both legs until for at least 2 weeks or as directed by physician office. They may be removed at night for sleeping.  MEDICATIONS:  See your medication summary on the After Visit Summary that nursing will review with you.  You may have some home medications which will be placed on hold  until you complete the course of blood thinner medication.  It is important for you to complete the blood thinner medication as prescribed.  PRECAUTIONS:  If you experience chest pain or shortness of breath - call 911 immediately for transfer to the hospital emergency department.   If you develop a fever greater that 101 F, purulent drainage from wound, increased redness or drainage from wound, foul odor from the wound/dressing, or calf pain - CONTACT YOUR SURGEON.                                                   FOLLOW-UP APPOINTMENTS:  If you do not already have a post-op appointment, please call the office for an appointment to be seen by your surgeon.  Guidelines for how soon to be seen are listed in your After Visit Summary, but are typically between 1-4 weeks after surgery.  OTHER INSTRUCTIONS:     MAKE SURE YOU:   Understand these instructions.   Get help right away if you are not doing well or get worse.    Thank you for letting us be a part of your medical care team.  It is a privilege we respect greatly.  We hope these instructions will help you stay on track for a fast and full recovery!

## 2017-05-21 NOTE — Progress Notes (Signed)
Initial Nutrition Assessment  DOCUMENTATION CODES:   Severe malnutrition in context of chronic illness  INTERVENTION:  1. Ensure Enlive po BID, each supplement provides 350 kcal and 20 grams of protein  NUTRITION DIAGNOSIS:   Malnutrition related to chronic illness (Dementia, Parkinson's Disease) as evidenced by severe depletion of muscle mass, severe depletion of body fat.  GOAL:   Patient will meet greater than or equal to 90% of their needs  MONITOR:   PO intake, I & O's, Labs, Supplement acceptance  REASON FOR ASSESSMENT:   Low Braden    ASSESSMENT:   Beth Roberts is a 83 yo female with PMH Dementia, COPD, GERD, Parkinson's disease, HTN, presents with a fall and left hip fracture. She is now 2 days s/p ORIF.  OT/PT eval today Meal Completion: 30-75% No family at bedside, patient unable to provide diet history.  Based on documented meal completion today, patient has consumed:  754 calories, 31 grams of protein (75%, 56% estimated needs)  Intake/Output Summary (Last 24 hours) at 05/21/17 1438 Last data filed at 05/21/17 1345  Gross per 24 hour  Intake             1690 ml  Output                0 ml  Net             1690 ml  4L Fluid positive Nutrition-Focused physical exam completed. Findings are severe fat depletion, severe muscle depletion, and no edema.  Labs reviewed Medications reviewed and include: Colace, Carbidopa-levodopa NS @ 3775mL/hr Dulcolax prn, Senokot-S prn, Zofran prn  Diet Order:  Diet regular Room service appropriate? Yes; Fluid consistency: Thin  Skin:     Last BM:  05/19/2017 (Type 4)  Height:   Ht Readings from Last 1 Encounters:  05/21/17 4\' 11"  (1.499 m)    Weight:   Wt Readings from Last 1 Encounters:  05/18/17 80 lb (36.3 kg)    Ideal Body Weight:  43.18 kg  BMI:  Body mass index is 15.12 kg/m.  Estimated Nutritional Needs:   Kcal:  1000-1100 calories (MSJ x1.4-1.5)  Protein:  55-64 grams (1.5-1.75g/kg)  Fluid:  >/=  1.5L  EDUCATION NEEDS:   Education needs no appropriate at this time  Dionne AnoWilliam M. Maisy Newport, MS, RD LDN Inpatient Clinical Dietitian Pager [phone]

## 2017-05-21 NOTE — Progress Notes (Signed)
  Subjective: 2 Days Post-Op Procedure(s) (LRB): INTRAMEDULLARY (IM) NAIL INTERTROCHANTRIC (Left) Patient is not able to verbalize a pain score to me. Patient is slowly improving. She still has dementia. Plan is to go Skilled nursing facility after hospital stay. Negative for chest pain and shortness of breath Fever: Most recent temp 97.9. Gastrointestinal:Negative for nausea and vomiting  Objective: Vital signs in last 24 hours: Temp:  [97.9 F (36.6 C)-100.1 F (37.8 C)] 97.9 F (36.6 C) (07/15 2300) Pulse Rate:  [89-104] 89 (07/15 2300) Resp:  [16-18] 16 (07/15 2300) BP: (101-133)/(44-80) 107/69 (07/15 2300) SpO2:  [93 %-98 %] 94 % (07/15 2300)  Intake/Output from previous day:  Intake/Output Summary (Last 24 hours) at 05/21/17 0726 Last data filed at 05/20/17 1956  Gross per 24 hour  Intake          1987.75 ml  Output                0 ml  Net          1987.75 ml    Intake/Output this shift: No intake/output data recorded.  Labs:  Recent Labs  05/18/17 2052 05/19/17 0413 05/20/17 0339 05/20/17 0755 05/21/17 0410  HGB 10.5* 9.2* 6.8* 6.6* 10.1*    Recent Labs  05/20/17 0339 05/21/17 0410  WBC 11.7* 12.7*  RBC 1.97* 3.19*  HCT 20.1* 29.5*  PLT 251 196    Recent Labs  05/20/17 0339 05/21/17 0410  NA 141 142  K 3.7 4.2  CL 111 111  CO2 24 25  BUN 34* 22*  CREATININE 0.81 0.53  GLUCOSE 143* 126*  CALCIUM 8.1* 8.4*    Recent Labs  05/18/17 2052  INR 1.08     EXAM General - Patient is Disorganized, Confused and Lacking Extremity - Intact pulses distally Dorsiflexion/Plantar flexion intact Incision: moderate drainage Dressing/Incision - blood tinged drainage to the left hip incision, no signs of infection. Motor Function - intact, moving foot and toes well on exam.  Past Medical History:  Diagnosis Date  . Allergy    allergic rhinitis  . COPD (chronic obstructive pulmonary disease) (HCC)    congenital Lung Deformity//Scar tissue on  lung( cxr) after surgery  )  . Dementia   . GERD (gastroesophageal reflux disease)    hiatal henia  . Hyperlipidemia   . Hypertension   . Osteoporosis   . Parkinson disease (HCC)     Assessment/Plan: 2 Days Post-Op Procedure(s) (LRB): INTRAMEDULLARY (IM) NAIL INTERTROCHANTRIC (Left) Principal Problem:   Closed left hip fracture (HCC) Active Problems:   HLD (hyperlipidemia)   Essential hypertension   GERD   Dementia due to Parkinson's disease without behavioral disturbance (HCC)   Pressure injury of skin  Estimated body mass index is 15.12 kg/m as calculated from the following:   Height as of 03/09/16: 5\' 1"  (1.549 m).   Weight as of this encounter: 36.3 kg (80 lb). Advance diet Up with therapy D/C IV fluids when tolerating po intake.  Improved hemoglobin at 10.1. Afebrile. Up with therapy today if the patient is able to tolerate it. Care management to assist with discharge planning, will need SNF placement.   DVT Prophylaxis - Lovenox, Foot Pumps and TED hose Weight-Bearing as tolerated to left leg  Dedra Skeensodd Itzael Liptak, PA-C [facility] 05/21/2017, 7:26 AM

## 2017-05-21 NOTE — Progress Notes (Signed)
Physical Therapy Treatment Patient Details Name: Beth Roberts MRN: [id number] DOB: 1925-02-16 Today's Date: 05/21/2017    History of Present Illness Beth Roberts is a 83yo white female [facility] after fall, subsequent hip fracture, IM nailing on 7/14, presenting on POD1. At baseline, pt is essentially WC bound, was performing transfers bed/WC wtih assistance. PMH: dementia, PD, HLD, HTN, GERD, Lt shoulder ORIF (2008), Rt HHA (2016). Since admission, pt noted to have (+)UA now starting ABX on 7/15, and Hb/HCT drop, receiving 2units PRBC on 7/15.     PT Comments    Beth Roberts was very pleasantly confused and participated in therapy this session with demonstration and use of simple commands.  She responds well with frequent use of her name and she enjoys speaking of her family.  Pt with SOB when initially sitting EOB which improved, unable to acquire SpO2 reading, RN notified.  She participated in therapeutic exercises and practiced her sitting balance with min guard>min assist for support.  Pt will benefit from continued skilled PT services to increase functional independence and safety.    Follow Up Recommendations  SNF;Supervision/Assistance - 24 hour     Equipment Recommendations  None recommended by PT    Recommendations for Other Services       Precautions / Restrictions Precautions Precautions: Fall Restrictions Weight Bearing Restrictions: Yes LLE Weight Bearing: Weight bearing as tolerated    Mobility  Bed Mobility Overal bed mobility: Needs Assistance Bed Mobility: Supine to Sit;Sit to Supine     Supine to sit: Max assist;HOB elevated Sit to supine: Max assist   General bed mobility comments: Max assist to manage BLEs and to elevate trunk to sit and to guide pt back to supine.    Transfers                 General transfer comment: Not safe to attempt at this time  Ambulation/Gait                 Stairs            Wheelchair Mobility    Modified Rankin (Stroke Patients Only)       Balance Overall balance assessment: Needs assistance;History of Falls Sitting-balance support: No upper extremity supported;Feet unsupported Sitting balance-Leahy Scale: Fair Sitting balance - Comments: Pt able to maintain balance sitting EOB with very close min guard assist and pt demonstrating fluctuating ant/post lean.                                     Cognition Arousal/Alertness: Awake/alert Behavior During Therapy: Flat affect Overall Cognitive Status: History of cognitive impairments - at baseline (unsure of the extent that UTI contributing to confusion)                                 General Comments: Pt responds well to positive approach technique with hand under hand.  Responds well with frequent use of her name and with demonstration.       Exercises General Exercises - Upper Extremity Shoulder Flexion: AROM;Both;10 reps;Supine;Other (comment) (with demonstration) General Exercises - Lower Extremity Heel Slides: AAROM;Both;10 reps;Supine Hip ABduction/ADduction: AAROM;Both;10 reps;Supine Straight Leg Raises: AROM;AAROM;Left;Right;10 reps;Supine Other Exercises Other Exercises: Pt sat EOB ~ 8 minutes requiring min guard>min assist for balance.  Practiced reaching with BUEs and  pushing and pulling with both hands.  Pt initially demonstrating SOB (unable to achieve reading on pulse ox) which improves over the course of a few minutes.    General Comments        Pertinent Vitals/Pain Pain Assessment: Faces Faces Pain Scale: Hurts little more Pain Location: Grimaces with LLE mobility and therapeutic exercise Pain Descriptors / Indicators: Grimacing;Guarding Pain Intervention(s): Limited activity within patient's tolerance;Monitored during session    Home Living                      Prior Function            PT Goals (current goals can now be  found in the care plan section) Acute Rehab PT Goals PT Goal Formulation: Patient unable to participate in goal setting Progress towards PT goals: Progressing toward goals    Frequency    BID      PT Plan Current plan remains appropriate    Co-evaluation              AM-PAC PT "6 Clicks" Daily Activity  Outcome Measure  Difficulty turning over in bed (including adjusting bedclothes, sheets and blankets)?: Total Difficulty moving from lying on back to sitting on the side of the bed? : Total Difficulty sitting down on and standing up from a chair with arms (e.g., wheelchair, bedside commode, etc,.)?: Total Help needed moving to and from a bed to chair (including a wheelchair)?: Total Help needed walking in hospital room?: Total Help needed climbing 3-5 steps with a railing? : Total 6 Click Score: 6    End of Session Equipment Utilized During Treatment: Gait belt;Oxygen Activity Tolerance: Patient tolerated treatment well;Patient limited by fatigue;Patient limited by pain Patient left: in bed;with call bell/phone within reach;with bed alarm set;with SCD's reapplied Nurse Communication: Mobility status;Other (comment) (pt with SOB with activity, unable to achieve SpO2 reading) PT Visit Diagnosis: History of falling (Z91.81);Other abnormalities of gait and mobility (R26.89)     Time: 1610-9604 PT Time Calculation (min) (ACUTE ONLY): 33 min  Charges:  $Therapeutic Exercise: 8-22 mins $Therapeutic Activity: 8-22 mins                    G Codes:       Encarnacion Chu PT, DPT 05/21/2017, 9:46 AM

## 2017-05-21 NOTE — Progress Notes (Signed)
[facility]   PATIENT NAME: Beth Roberts    MR#:  [id number]  DATE OF BIRTH:  13-Sep-1925  SUBJECTIVE:  CHIEF COMPLAINT:   Chief Complaint  Patient presents with  . Fall    -Postop day 2, after left hip surgery. Hemoglobin improved After 2 units transfusion yesterday. -Patient is more alert today  REVIEW OF SYSTEMS:  Review of Systems  Unable to perform ROS: Dementia    DRUG ALLERGIES:   Allergies  Allergen Reactions  . Ace Inhibitors     Voice dysfunction  . Amlodipine     Flushing/ dizziness  . Codeine     REACTION: nausea and vomiting  . Klonopin [Clonazepam]     Dizzy/ sleepless  . Penicillins     REACTION: breaks out  . Requip [Ropinirole Hcl]     Not effective and makes her nervous  . Sulfonamide Derivatives     VITALS:  Blood pressure (!) 153/84, pulse 100, temperature 97.9 F (36.6 C), resp. rate 20, weight 36.3 kg (80 lb), SpO2 100 %.  PHYSICAL EXAMINATION:  Physical Exam  GENERAL:  83 y.o.-year-old elderly, very thin and cachectic patient lying in the bed with no acute distress.  EYES: Pupils equal, round, reactive to light and accommodation. No scleral icterus. Extraocular muscles intact.  HEENT: Head atraumatic, normocephalic. Oropharynx and nasopharynx clear.  NECK:  Supple, no jugular venous distention. No thyroid enlargement, no tenderness.  LUNGS: Normal breath sounds bilaterally, no wheezing, rales,rhonchi or crepitation. No use of accessory muscles of respiration. Decreased bibasilar breath sounds CARDIOVASCULAR: S1, S2 normal. No  rubs, or gallops. 2/6 systolic murmur present ABDOMEN: Soft, nontender, nondistended. Bowel sounds present. No organomegaly or mass.  EXTREMITIES: left hip dressing in place, Increased swelling, ecchymosis. -No pedal edema, cyanosis, or clubbing.  NEUROLOGIC: Mumbling to herself, alert , not following commands. PSYCHIATRIC: The patient is alert but not following commands  due to her dementia.  SKIN: No obvious rash, lesion, or ulcer.    LABORATORY PANEL:   CBC  Recent Labs Lab 05/21/17 0410  WBC 12.7*  HGB 10.1*  HCT 29.5*  PLT 196   ------------------------------------------------------------------------------------------------------------------  Chemistries   Recent Labs Lab 05/21/17 0410  NA 142  K 4.2  CL 111  CO2 25  GLUCOSE 126*  BUN 22*  CREATININE 0.53  CALCIUM 8.4*   ------------------------------------------------------------------------------------------------------------------  Cardiac Enzymes No results for input(s): TROPONINI in the last 168 hours. ------------------------------------------------------------------------------------------------------------------  RADIOLOGY:  Dg Hip Operative Unilat W Or W/o Pelvis Left  Result Date: 05/19/2017 CLINICAL DATA:  Left hip fracture. EXAM: OPERATIVE LEFT HIP (WITH PELVIS IF PERFORMED) 4 VIEWS TECHNIQUE: Fluoroscopic spot image(s) were submitted for interpretation post-operatively. COMPARISON:  05/18/2017. FINDINGS: Four C-arm views of the left hip and femur demonstrate compression screw and rod fixation of the previously demonstrated intertrochanteric fracture. There is angulation is noted. There is also 2/3 shaft width of anterior displacement and posterior angulation of the distal fragment. IMPRESSION: Hardware fixation of the previously demonstrated intertrochanteric fracture with displacement and angulation, as described above. Electronically Signed   By: Beckie SaltsSteven  Reid M.D.   On: 05/19/2017 12:16    EKG:   Orders placed or performed during the hospital encounter of 05/18/17  . ED EKG  . ED EKG  . EKG 12-Lead  . EKG 12-Lead    ASSESSMENT AND PLAN:   83 year old female with past medical history significant for severe dementia, COPD, GERD, hypertension, osteoporosis who is wheelchair bound at baseline  was brought in secondary to a fall and left hip fracture.  #1 left hip  intertrochanteric fracture-appreciate orthopedics consult. Status post ORIF, Postop day 2 today. -Pain control and physical therapy. -DVT prophylaxis. - #2 acute anemia-noted after surgery. . -2 units packed RBC transfusion received yesterday and hemoglobin is at 10 today   #3 dementia and Parkinson's disease without behavioral disturbances-continue Sinemet. -Patient is wheelchair bound at baseline Not oriented   #4 GERD-on Protonix  #5 hypertension-continue clonidine and losartan  #6 DVT prophylaxis- on Lovenox  #7 Acute cystitis-leukocytosis with fevers, UA positive for infection.  -Follow up urine cultures and on Rocephin   Discussed with daughter over the phone. Anticipate discharge to [facility] tomorrow   All the records are reviewed and case discussed with Care Management/Social Workerr. Management plans discussed with the patient, family and they are in agreement.  CODE STATUS: DNR  TOTAL TIME TAKING CARE OF THIS PATIENT: 33 minutes.   POSSIBLE D/C TOMORROW, DEPENDING ON CLINICAL CONDITION.   Enid Baas M.D on 05/21/2017 at 8:52 AM  Between 7am to 6pm - Pager - [phone]  After 6pm go to [email or web address] - Social research officer, government  [facility]  Office  [phone]  CC: Primary care physician; Dorothey Baseman, MD

## 2017-05-21 NOTE — Evaluation (Signed)
Occupational Therapy Evaluation Patient Details Name: Beth Roberts MRN: [id number] DOB: 26-Feb-1925 Today's Date: 05/21/2017    History of Present Illness Beth Roberts is a 83yo white female who comes to [facility] on 7/13 from [facility] after fall, subsequent hip fracture, IM nailing on 7/14, presenting on POD1. At baseline, pt is essentially WC bound, was performing transfers bed/WC wtih assistance. PMH: dementia, PD, HLD, HTN, GERD, Lt shoulder ORIF (2008), Rt HHA (2016). Since admission, pt noted to have (+)UA now starting ABX on 7/15, and Hb/HCT drop, receiving 2units PRBC on 7/15.    Clinical Impression   Pt seen for OT evaluation this date. Pt is s/p L IM nailing POD#2 at time of evaluation after mechanical fall leading to a L hip fracture. Pt pleasant, able to follow simple commands with verbal/visual cues and some hand under hand utilized. Pt did not speak during session, keeping mouth closed and occasionally mumbling through closed mouth when asked a question. Pt required max assist for bed mobility and dressing tasks during session. Not safe to attempt ambulation and transfers this date. No SOB noted during session. Pt will benefit from skilled OT services to address noted impairments (please see OT problem list below) and functional impairments in order to maximize return to PLOF and minimize caregiver burden and risk of future falls/injury/rehospitalization. At end of session, pt noted to have swelling on R wrist proximal to IV site (possible IV fluid infiltration?). RN notified promptly and went to assess pt.     Follow Up Recommendations  SNF    Equipment Recommendations  3 in 1 bedside commode    Recommendations for Other Services       Precautions / Restrictions Precautions Precautions: Fall Restrictions Weight Bearing Restrictions: Yes LLE Weight Bearing: Weight bearing as tolerated      Mobility Bed Mobility Overal bed mobility: Needs Assistance Bed Mobility:  Rolling Rolling: Max assist     General bed mobility comments: pt requires max assist for rolling side to side in bed with verbal cues and hand under hand for UE reaching for bed rails  Transfers                 General transfer comment: Not safe to attempt at this time    Balance                                   ADL either performed or assessed with clinical judgement   ADL Overall ADL's : Needs assistance/impaired         Upper Body Bathing: Maximal assistance;Bed level   Lower Body Bathing: Maximal assistance;Bed level;Cueing for sequencing   Upper Body Dressing : Maximal assistance;Bed level;Cueing for sequencing Upper Body Dressing Details (indicate cue type and reason): verbal cues for hand placement/sequencing to assist with donning clean hospital gown Lower Body Dressing: Maximal assistance;Bed level     Toilet Transfer Details (indicate cue type and reason): unsafe at this time           General ADL Comments: pt generally max assist for ADL tasks with cueing and tactile or hand under hand technique used      Vision Baseline Vision/History: No visual deficits (nothing noted in chart) Patient Visual Report: Other (comment) (pt unable to verbalize visual report) Vision Assessment?: No apparent visual deficits     Perception     Praxis      Pertinent Vitals/Pain Pain  Assessment: Faces Faces Pain Scale: Hurts even more Pain Location: grimaces with LLE mobility in bed during rolling to R side Pain Descriptors / Indicators: Grimacing;Guarding Pain Intervention(s): Limited activity within patient's tolerance;Monitored during session;Repositioned     Hand Dominance     Extremity/Trunk Assessment Upper Extremity Assessment Upper Extremity Assessment: Difficult to assess due to impaired cognition (good grip strength, bilaterally)   Lower Extremity Assessment Lower Extremity Assessment: Difficult to assess due to impaired cognition        Communication Communication Communication: Other (comment) (Per PT was talking this morning; did not open mouth during OT evaluation, mumbled a few words through her closed mouth)   Cognition Arousal/Alertness: Awake/alert Behavior During Therapy: Flat affect Overall Cognitive Status: History of cognitive impairments - at baseline                                 General Comments: pt pleasantly confused, able to follow simple commands with verbal and occasional hand under hand.   General Comments  swelling noted on R wrist near IV (possible IV fluid infiltration?), RN notified directly after session     Exercises    Shoulder Instructions      Home Living Family/patient expects to be discharged to:: Skilled nursing facility                                        Prior Functioning/Environment Level of Independence: Needs assistance  Gait / Transfers Assistance Needed: WC bound at baseline, performs standing pivot transfers with assistance ADL's / Homemaking Assistance Needed: unsure how much assist was required for ADL tasks, pt did not answer questions during evaluation, no family/caregivers present            OT Problem List: Pain;Decreased strength;Decreased range of motion;Decreased activity tolerance;Decreased cognition      OT Treatment/Interventions: Self-care/ADL training;Therapeutic exercise;Therapeutic activities;DME and/or AE instruction;Patient/family education    OT Goals(Current goals can be found in the care plan section) Acute Rehab OT Goals OT Goal Formulation: Patient unable to participate in goal setting  OT Frequency: Min 1X/week   Barriers to D/C:            Co-evaluation              AM-PAC PT "6 Clicks" Daily Activity     Outcome Measure Help from another person eating meals?: None Help from another person taking care of personal grooming?: None Help from another person toileting, which includes using  toliet, bedpan, or urinal?: A Lot Help from another person bathing (including washing, rinsing, drying)?: A Lot Help from another person to put on and taking off regular upper body clothing?: A Lot Help from another person to put on and taking off regular lower body clothing?: A Lot 6 Click Score: 16   End of Session Nurse Communication: Other (comment) (notified of R wrist swelling near IV site, RN went to check on this promptly)  Activity Tolerance: Patient tolerated treatment well Patient left: in bed;with call bell/phone within reach;with bed alarm set;with SCD's reapplied;Other (comment) (mitts on both hands in place)  OT Visit Diagnosis: Other abnormalities of gait and mobility (R26.89);Repeated falls (R29.6);Pain Pain - Right/Left: Left Pain - part of body: Hip                Time: [id number] OT Time Calculation (min):  16 min Charges:  OT General Charges $OT Visit: 1 Procedure OT Evaluation $OT Eval Moderate Complexity: 1 Procedure G-Codes:     Richrd Prime, MPH, MS, OTR/L ascom [phone] 05/21/17, 12:17 PM

## 2017-05-21 NOTE — Progress Notes (Signed)
PT Cancellation Note  Patient Details Name: Beth Roberts MRN: [id number] DOB: 05/01/1925   Cancelled Treatment:    Reason Eval/Treat Not Completed: Fatigue/lethargy limiting ability to participate. Treatment attempted at length this afternoon. Pt sleeping; able to partially awaken with effort. Pt awakens without opening eyes to say "no" and "be nice". Nursing assistant in to get vital and pt still does not respond well to attempted treatment. Final attempt to assist movement of lower extremities, but pt becomes verbally agitated and resists attempted movement. No further attempt. Re attempt tomorrow.    Scot DockHeidi E Barnes, PTA 05/21/2017, 3:11 PM

## 2017-05-21 NOTE — Progress Notes (Signed)
Per Lovett CalenderJoseph [facility] liaison patient is a long term care resident at [facility] and has [facility] services. Per Jomarie LongsJoseph patient can return to [facility] under long term care and hospice. Clydie BraunKaren hospice liaison is aware of above. Clinical Social Worker (CSW) will continue to follow and assist as needed.   Baker Hughes IncorporatedBailey Koden Hunzeker, LCSW [phone]

## 2017-05-22 LAB — CBC
HCT: 28.7 % — ABNORMAL LOW (ref 35.0–47.0)
Hemoglobin: 10.1 g/dL — ABNORMAL LOW (ref 12.0–16.0)
MCH: 32.6 pg (ref 26.0–34.0)
MCHC: 35.2 g/dL (ref 32.0–36.0)
MCV: 92.4 fL (ref 80.0–100.0)
PLATELETS: 235 10*3/uL (ref 150–440)
RBC: 3.11 MIL/uL — AB (ref 3.80–5.20)
RDW: 18.4 % — AB (ref 11.5–14.5)
WBC: 10.3 10*3/uL (ref 3.6–11.0)

## 2017-05-22 LAB — URINE CULTURE: Culture: <10000 — AB

## 2017-05-22 MED ORDER — SENNOSIDES-DOCUSATE SODIUM 8.6-50 MG PO TABS: 1.0000 | ORAL_TABLET | Freq: Two times a day (BID) | ORAL | 0 refills | Status: AC

## 2017-05-22 MED ORDER — ENOXAPARIN SODIUM 30 MG/0.3ML ~~LOC~~ SOLN: 30.0000 mg | SUBCUTANEOUS | 0 refills | Status: AC

## 2017-05-22 MED ORDER — CEFUROXIME AXETIL 250 MG PO TABS: 250.0000 mg | ORAL_TABLET | Freq: Two times a day (BID) | ORAL | 0 refills | Status: AC

## 2017-05-22 NOTE — Plan of Care (Signed)
Problem: Education: Goal: Knowledge of [facility] Education information/materials will improve Outcome: Not Met (add Reason) Cognitive limitations

## 2017-05-22 NOTE — Care Management Important Message (Signed)
Important Message  Patient Details  Name: Marrianne MoodHazel J Artist MRN: [id number] Date of Birth: 15-Dec-1924   Medicare Important Message Given:  Yes    Marily MemosLisa M Amye Grego, RN 05/22/2017, 10:57 AM

## 2017-05-22 NOTE — Discharge Summary (Signed)
[facility] [place] [facility]   PATIENT NAME: Beth Roberts    MR#:  [id number]  DATE OF BIRTH:  July 12, 1925  DATE OF ADMISSION:  05/18/2017   ADMITTING PHYSICIAN: Oralia Manis, MD  DATE OF DISCHARGE: 05/22/2017  PRIMARY CARE PHYSICIAN: Dorothey Baseman, MD   ADMISSION DIAGNOSIS:   Traumatic hematoma of forehead, initial encounter [S00.83XA] Closed fracture of left hip, initial encounter (HCC) [S72.002A] Fall, initial encounter [W19.XXXA]  DISCHARGE DIAGNOSIS:   Principal Problem:   Closed left hip fracture (HCC) Active Problems:   HLD (hyperlipidemia)   Essential hypertension   GERD   Dementia due to Parkinson's disease without behavioral disturbance (HCC)   Pressure injury of skin   SECONDARY DIAGNOSIS:   Past Medical History:  Diagnosis Date  . Allergy    allergic rhinitis  . COPD (chronic obstructive pulmonary disease) (HCC)    congenital Lung Deformity//Scar tissue on lung( cxr) after surgery  )  . Dementia   . GERD (gastroesophageal reflux disease)    hiatal henia  . Hyperlipidemia   . Hypertension   . Osteoporosis   . Parkinson disease [facility])     HOSPITAL COURSE:   82 year old female with past medical history significant for severe dementia, COPD, GERD, hypertension, osteoporosis who is wheelchair bound at baseline was brought in secondary to a fall and left hip fracture.  #1 left hip intertrochanteric fracture-appreciate orthopedics consult. Status post ORIF, Postop day 3 today. -Pain control and physical therapy. -DVT prophylaxis. - #2 acute anemia-noted after surgery. . -2 units packed RBC transfusion received and hemoglobin is stable at 10   #3 dementia and Parkinson's disease without behavioral disturbances-continue Sinemet. -Patient is wheelchair bound at baseline Not oriented   #4 GERD-on Protonix  #5 hypertension-continue clonidine and losartan  #6 DVT prophylaxis- on Lovenox for 2 weeks after hip  surgery  #7 Acute cystitis-leukocytosis with fevers, UA positive for infection.  -Follow up urine cultures and on Rocephin- change to ceftin at discharge  DISCHARGE CONDITIONS:   Guarded  CONSULTS OBTAINED:   Treatment Team:  Kennedy Bucker, MD  DRUG ALLERGIES:   Allergies  Allergen Reactions  . Ace Inhibitors     Voice dysfunction  . Amlodipine     Flushing/ dizziness  . Codeine     REACTION: nausea and vomiting  . Klonopin [Clonazepam]     Dizzy/ sleepless  . Penicillins     REACTION: breaks out  . Requip [Ropinirole Hcl]     Not effective and makes her nervous  . Sulfonamide Derivatives    DISCHARGE MEDICATIONS:   Allergies as of 05/22/2017      Reactions   Ace Inhibitors    Voice dysfunction   Amlodipine    Flushing/ dizziness   Codeine    REACTION: nausea and vomiting   Klonopin [clonazepam]    Dizzy/ sleepless   Penicillins    REACTION: breaks out   Requip [ropinirole Hcl]    Not effective and makes her nervous   Sulfonamide Derivatives       Medication List    STOP taking these medications   cephALEXin 500 MG capsule Commonly known as:  KEFLEX   clonazePAM 0.5 MG tablet Commonly known as:  KLONOPIN   morphine CONCENTRATE 10 mg / 0.5 ml concentrated solution   potassium chloride 10 MEQ CR capsule Commonly known as:  MICRO-K   sertraline 50 MG tablet Commonly known as:  ZOLOFT     TAKE these medications   acetaminophen 325  MG tablet Commonly known as:  TYLENOL Take 2 tablets (650 mg total) by mouth every 6 (six) hours as needed for mild pain, fever or headache. What changed:  Another medication with the same name was removed. Continue taking this medication, and follow the directions you see here.   bisacodyl 10 MG suppository Commonly known as:  DULCOLAX Place 1 suppository (10 mg total) rectally daily as needed for moderate constipation.   carbamazepine 200 MG 12 hr capsule Commonly known as:  CARBATROL Take 200 mg by mouth  daily.   carbamazepine 300 MG 12 hr capsule Commonly known as:  CARBATROL Take 300 mg by mouth daily.   carbamazepine 200 MG 12 hr tablet Commonly known as:  TEGRETOL XR Take 400 mg by mouth at bedtime.   carbidopa-levodopa 10-100 MG tablet Commonly known as:  SINEMET IR Take 1 tablet by mouth 4 (four) times daily.   cefUROXime 250 MG tablet Commonly known as:  CEFTIN Take 1 tablet (250 mg total) by mouth 2 (two) times daily with a meal. X 4 more days   cloNIDine 0.1 MG tablet Commonly known as:  CATAPRES Take 0.1 mg by mouth daily.   cyanocobalamin 500 MCG tablet Take 500 mcg by mouth daily.   enoxaparin 30 MG/0.3ML injection Commonly known as:  LOVENOX Inject 0.3 mLs (30 mg total) into the skin daily.   escitalopram 10 MG tablet Commonly known as:  LEXAPRO Take 10 mg by mouth daily.   GENTEAL 0.25-0.3 % Gel Generic drug:  Carboxymethylcell-Hypromellose Apply 1 application to eye at bedtime.   HYDROcodone-acetaminophen 5-325 MG tablet Commonly known as:  NORCO/VICODIN Take 1-2 tablets by mouth every 6 (six) hours as needed for moderate pain.   lidocaine 5 % Commonly known as:  LIDODERM Place 1 patch onto the skin daily. Remove & Discard patch within 12 hours or as directed by MD   loratadine 10 MG tablet Commonly known as:  CLARITIN Take 1 tablet (10 mg total) by mouth daily as needed for allergies.   losartan 50 MG tablet Commonly known as:  COZAAR TAKE 1 TABLET BY MOUTH EVERY DAY What changed:  how much to take  how to take this  when to take this  additional instructions   omeprazole 20 MG capsule Commonly known as:  PRILOSEC TAKE 1 CAPSULE (20 MG TOTAL) BY MOUTH AT BEDTIME.   pramipexole 0.125 MG tablet Commonly known as:  MIRAPEX 1 in AM, 2 po q hs What changed:  how much to take  when to take this  additional instructions   senna-docusate 8.6-50 MG tablet Commonly known as:  Senokot-S Take 1 tablet by mouth 2 (two) times  daily. What changed:  when to take this  reasons to take this   SYSTANE 0.4-0.3 % Soln Generic drug:  Polyethyl Glycol-Propyl Glycol Apply 1 drop to eye 4 (four) times daily.   traZODone 150 MG tablet Commonly known as:  DESYREL Take 150 mg by mouth at bedtime.        DISCHARGE INSTRUCTIONS:   1. PCP follow-up in 1-2 weeks 2. Orthopedics follow-up within 10 days as prior scheduled  DIET:   Cardiac diet  ACTIVITY:   Activity as tolerated  OXYGEN:   Home Oxygen: Yes.    Oxygen Delivery: 2 liters/min via Patient connected to nasal cannula oxygen  DISCHARGE LOCATION:   nursing home   If you experience worsening of your admission symptoms, develop shortness of breath, life threatening emergency, suicidal or homicidal thoughts you must  seek medical attention immediately by calling 911 or calling your MD immediately  if symptoms less severe.  You Must read complete instructions/literature along with all the possible adverse reactions/side effects for all the Medicines you take and that have been prescribed to you. Take any new Medicines after you have completely understood and accpet all the possible adverse reactions/side effects.   Please note  You were cared for by a hospitalist during your hospital stay. If you have any questions about your discharge medications or the care you received while you were in the hospital after you are discharged, you can call the unit and asked to speak with the hospitalist on call if the hospitalist that took care of you is not available. Once you are discharged, your primary care physician will handle any further medical issues. Please note that NO REFILLS for any discharge medications will be authorized once you are discharged, as it is imperative that you return to your primary care physician (or establish a relationship with a primary care physician if you do not have one) for your aftercare needs so that they can reassess your need for  medications and monitor your lab values.    On the day of Discharge:  VITAL SIGNS:   Blood pressure 140/66, pulse 88, temperature 99.3 F (37.4 C), temperature source Oral, resp. rate 19, height 4\' 11"  (1.499 m), weight 36.3 kg (80 lb), SpO2 98 %.  PHYSICAL EXAMINATION:    GENERAL:  83 y.o.-year-old elderly, very thin and cachectic patient lying in the bed with no acute distress.  EYES: Pupils equal, round, reactive to light and accommodation. No scleral icterus. Extraocular muscles intact.  HEENT: Head atraumatic, normocephalic. Oropharynx and nasopharynx clear.  NECK:  Supple, no jugular venous distention. No thyroid enlargement, no tenderness.  LUNGS: Normal breath sounds bilaterally, no wheezing, rales,rhonchi or crepitation. No use of accessory muscles of respiration. Decreased bibasilar breath sounds CARDIOVASCULAR: S1, S2 normal. No  rubs, or gallops. 2/6 systolic murmur present ABDOMEN: Soft, nontender, nondistended. Bowel sounds present. No organomegaly or mass.  EXTREMITIES: left hip dressing in place, Increased swelling, ecchymosis. -No pedal edema, cyanosis, or clubbing.  NEUROLOGIC: Mumbling to herself, alert , not following commands. PSYCHIATRIC: The patient is alert but not following commands due to her dementia.  SKIN: No obvious rash, lesion, or ulcer.  DATA REVIEW:   CBC  Recent Labs Lab 05/22/17 0500  WBC 10.3  HGB 10.1*  HCT 28.7*  PLT 235    Chemistries   Recent Labs Lab 05/21/17 0410  NA 142  K 4.2  CL 111  CO2 25  GLUCOSE 126*  BUN 22*  CREATININE 0.53  CALCIUM 8.4*     Microbiology Results  Results for orders placed or performed during the hospital encounter of 05/18/17  MRSA PCR Screening     Status: None   Collection Time: 05/18/17 10:17 PM  Result Value Ref Range Status   MRSA by PCR NEGATIVE NEGATIVE Final    Comment:        The GeneXpert MRSA Assay (FDA approved for NASAL specimens only), is one component of a comprehensive  MRSA colonization surveillance program. It is not intended to diagnose MRSA infection nor to guide or monitor treatment for MRSA infections.   Urine Culture     Status: Abnormal   Collection Time: 05/19/17  9:52 AM  Result Value Ref Range Status   Specimen Description URINE, RANDOM  Final   Special Requests NONE  Final   Culture (A)  Final    <10,000 COLONIES/mL INSIGNIFICANT GROWTH Performed at [facility], [address], [place], [place] [address]    Report Status 05/22/2017 FINAL  Final    RADIOLOGY:  No results found.   Management plans discussed with the patient, family and they are in agreement.  CODE STATUS:     Code Status Orders        Start     Ordered   05/18/17 2205  Do not attempt resuscitation (DNR)  Continuous    Question Answer Comment  In the event of cardiac or respiratory ARREST Do not call a "code blue"   In the event of cardiac or respiratory ARREST Do not perform Intubation, CPR, defibrillation or ACLS   In the event of cardiac or respiratory ARREST Use medication by any route, position, wound care, and other measures to relive pain and suffering. May use oxygen, suction and manual treatment of airway obstruction as needed for comfort.      05/18/17 2204    Code Status History    Date Active Date Inactive Code Status Order ID Comments User Context   07/19/2015  3:43 PM 07/20/2015 10:21 PM DNR [id number]  Suan Halter, MD Inpatient   07/18/2015 11:16 PM 07/19/2015  3:43 PM DNR [id number]  Shaune Pollack, MD Inpatient    Advance Directive Documentation     Most Recent Value  Type of Advance Directive  Healthcare Power of Attorney  Pre-existing out of facility DNR order (yellow form or pink MOST form)  -  "MOST" Form in Place?  -      TOTAL TIME TAKING CARE OF THIS PATIENT: 37 minutes.    Enid Baas M.D on 05/22/2017 at 10:56 AM  Between 7am to 6pm - Pager - [phone]  After 6pm go to [email or web address] - Air traffic controller  [facility]  Office  [phone]  CC: Primary care physician; Dorothey Baseman, MD   Note: This dictation was prepared with Dragon dictation along with smaller phrase technology. Any transcriptional errors that result from this process are unintentional.

## 2017-05-22 NOTE — Progress Notes (Signed)
Visit made. Patient seen lying in bed, appeared to be sleeping, did not awaken to voice. Appeared comfortable. Per staff RN Audry tylenol given for pain, she also reported that patient was alert and interactive prior to writer's visit.  Plan is for discharge back to [facility] today to resume hospice services. Chart notes reviewed. Discharge summary faxed to triage, hospice team alerted to discharge. Thank  You. Dayna BarkerKaren Robertson RN, BSN, [facility], hospital Liaison [phone] c

## 2017-05-22 NOTE — Progress Notes (Signed)
Pt ready for discharge to [facility]. Pt  Will transfer via EMS. Called dispatch.

## 2017-05-22 NOTE — Progress Notes (Signed)
Physical Therapy Treatment Patient Details Name: Beth Roberts J Suttles MRN: [id number] DOB: 1925/02/07 Today's Date: 05/22/2017    History of Present Illness Guerry MinorsHazel Kurihara is a 83yo white female who [facility] from [facility] after fall, subsequent hip fracture, IM nailing on 7/14, presenting on POD1. At baseline, pt is essentially WC bound, was performing transfers bed/WC wtih assistance. PMH: dementia, PD, HLD, HTN, GERD, Lt shoulder ORIF (2008), Rt HHA (2016). Since admission, pt noted to have (+)UA now starting ABX on 7/15, and Hb/HCT drop, receiving 2units PRBC on 7/15.     PT Comments    Pt agreeable to PT; pt denies pain, but pain noted with assisted Left lower extremity hip abduction in small range. Pt also states, "I can't walk". Pt performs exercises well with verbal and tactile cues; pt quite active with upper extremities as well, performing exercises often in same manner/direction as lower extremities. Pt responds well to counting and will count along. Pt left in nursing care for medication and feeding, as pt has just recently been responsive. Pt prepared for discharge back to LTC with hospice.    Follow Up Recommendations  SNF;Supervision/Assistance - 24 hour (pt can return to LTC with hospice per chart)     Equipment Recommendations       Recommendations for Other Services       Precautions / Restrictions Precautions Precautions: Fall Restrictions Weight Bearing Restrictions: Yes LLE Weight Bearing: Weight bearing as tolerated    Mobility  Bed Mobility               General bed mobility comments: Not tested; pt has just recently awoken and nursing to give pt medication/feed, then pt ready for discharge  Transfers                    Ambulation/Gait                 Stairs            Wheelchair Mobility    Modified Rankin (Stroke Patients Only)       Balance                                             Cognition Arousal/Alertness: Awake/alert Behavior During Therapy: WFL for tasks assessed/performed Overall Cognitive Status: History of cognitive impairments - at baseline                                        Exercises General Exercises - Lower Extremity Ankle Circles/Pumps: AAROM;Both;20 reps;Supine Short Arc Quad: AAROM;Both;20 reps;Supine Heel Slides: AAROM;Both;20 reps;Supine Hip ABduction/ADduction: AAROM;PROM;Both;20 reps;Supine (more passive on L) Straight Leg Raises: AAROM;Left;20 reps;Supine (strengthening on R) Other Exercises Other Exercises: Pt very active with UEs (doing exercises) during session    General Comments        Pertinent Vitals/Pain Pain Assessment: Faces Faces Pain Scale: Hurts little more Pain Location: grimaces/moans with minimal L hip abduction Pain Intervention(s): Monitored during session;Limited activity within patient's tolerance    Home Living                      Prior Function            PT Goals (current goals can now be  found in the care plan section) Progress towards PT goals: Progressing toward goals (slowly)    Frequency    BID      PT Plan Current plan remains appropriate    Co-evaluation              AM-PAC PT "6 Clicks" Daily Activity  Outcome Measure  Difficulty turning over in bed (including adjusting bedclothes, sheets and blankets)?: Total Difficulty moving from lying on back to sitting on the side of the bed? : Total Difficulty sitting down on and standing up from a chair with arms (e.g., wheelchair, bedside commode, etc,.)?: Total Help needed moving to and from a bed to chair (including a wheelchair)?: Total Help needed walking in hospital room?: Total Help needed climbing 3-5 steps with a railing? : Total 6 Click Score: 6    End of Session   Activity Tolerance: Patient tolerated treatment well;Patient limited by fatigue;Patient limited by pain Patient left: in bed;with  nursing/sitter in room;with call bell/phone within reach   PT Visit Diagnosis: History of falling (Z91.81);Other abnormalities of gait and mobility (R26.89)     Time: 1914-7829 PT Time Calculation (min) (ACUTE ONLY): 17 min  Charges:  $Therapeutic Exercise: 8-22 mins                    G Codes:        Scot Dock, PTA 05/22/2017, 11:20 AM

## 2017-05-22 NOTE — Progress Notes (Signed)
Patient is medically stable for D/C back to [facility] and continue hospice services with [facility]. Per Beth Roberts [facility] liaison patient can return today to room 301. RN will call report to RN Elly ModenaKathy Roberts at [phone] and has agreed to call patient's daughter Beth Roberts when EMS arrives. RN will arrange EMS for transport. CSW sent D[facility]C orders to [facility] via [facility]. CSW contacted patient's daughter Beth Roberts and made her aware of above. Beth Roberts hospice liaison is aware of above. Please reconsult if future social work needs arise. CSW signing off.   Baker Hughes IncorporatedBailey Lauralye Kinn, LCSW [phone]

## 2017-05-22 NOTE — Progress Notes (Signed)
  Subjective: 3 Days Post-Op Procedure(s) (LRB): INTRAMEDULLARY (IM) NAIL INTERTROCHANTRIC (Left) Patient is not able to verbalize a pain score to me. Patient is slowly improving. She still has dementia. Plan is to go Skilled nursing facility after hospital stay. Negative for chest pain and shortness of breath Fever: Most recent temp 99.3. Gastrointestinal:Negative for nausea and vomiting  Objective: Vital signs in last 24 hours: Temp:  [98.7 F (37.1 C)-99.3 F (37.4 C)] 99.3 F (37.4 C) (07/16 1842) Pulse Rate:  [88-100] 88 (07/16 2303) Resp:  [17-20] 19 (07/16 2303) BP: (117-153)/(57-84) 140/66 (07/16 2303) SpO2:  [98 %-100 %] 98 % (07/16 2303)  Intake/Output from previous day:  Intake/Output Summary (Last 24 hours) at 05/22/17 0623 Last data filed at 05/21/17 1818  Gross per 24 hour  Intake              720 ml  Output                0 ml  Net              720 ml    Intake/Output this shift: No intake/output data recorded.  Labs:  Recent Labs  05/20/17 0339 05/20/17 0755 05/21/17 0410 05/22/17 0500  HGB 6.8* 6.6* 10.1* 10.1*    Recent Labs  05/21/17 0410 05/22/17 0500  WBC 12.7* 10.3  RBC 3.19* 3.11*  HCT 29.5* 28.7*  PLT 196 235    Recent Labs  05/20/17 0339 05/21/17 0410  NA 141 142  K 3.7 4.2  CL 111 111  CO2 24 25  BUN 34* 22*  CREATININE 0.81 0.53  GLUCOSE 143* 126*  CALCIUM 8.1* 8.4*   No results for input(s): LABPT, INR in the last 72 hours.   EXAM General - Patient is Disorganized, Confused and Lacking Extremity - Intact pulses distally Dorsiflexion/Plantar flexion intact Incision: moderate drainage Dressing/Incision - Dressing is clean and intact with no discharge. Motor Function - intact, moving foot and toes well on exam.  Past Medical History:  Diagnosis Date  . Allergy    allergic rhinitis  . COPD (chronic obstructive pulmonary disease) (HCC)    congenital Lung Deformity//Scar tissue on lung( cxr) after surgery  )  .  Dementia   . GERD (gastroesophageal reflux disease)    hiatal henia  . Hyperlipidemia   . Hypertension   . Osteoporosis   . Parkinson disease (HCC)     Assessment/Plan: 3 Days Post-Op Procedure(s) (LRB): INTRAMEDULLARY (IM) NAIL INTERTROCHANTRIC (Left) Principal Problem:   Closed left hip fracture (HCC) Active Problems:   HLD (hyperlipidemia)   Essential hypertension   GERD   Dementia due to Parkinson's disease without behavioral disturbance (HCC)   Pressure injury of skin  Estimated body mass index is 15.12 kg/m as calculated from the following:   Height as of 03/09/16: 5\' 1"  (1.549 m).   Weight as of this encounter: 36.3 kg (80 lb). Advance diet Up with therapy D/C IV fluids when tolerating po intake. The patient has had a bowel movement.  Improved hemoglobin at 10.1. Afebrile. Up with therapy today if the patient is able to tolerate it. Care management to assist with discharge planning, will need SNF placement.   DVT Prophylaxis - Lovenox, Foot Pumps and TED hose Weight-Bearing as tolerated to left leg  Dedra Skeensodd Mitzie Marlar, PA-C [facility] 05/22/2017, 6:23 AM

## 2017-05-22 NOTE — Plan of Care (Signed)
Problem: Physical Regulation: Goal: Postoperative complications will be avoided or minimized Outcome: Not Applicable Date Met: 50/27/74 Cognitive limitations

## 2017-07-07 DEATH — deceased
